# Patient Record
Sex: Male | Born: 1970 | Race: White | Hispanic: No | Marital: Single | Smoking: Never smoker
Health system: Southern US, Community
[De-identification: ages and names within clinical notes are randomized; demographics above are authoritative.]

## PROBLEM LIST (undated history)

## (undated) DIAGNOSIS — F320 Major depressive disorder, single episode, mild: Secondary | ICD-10-CM

## (undated) DIAGNOSIS — E785 Hyperlipidemia, unspecified: Secondary | ICD-10-CM

## (undated) DIAGNOSIS — Z87442 Personal history of urinary calculi: Secondary | ICD-10-CM

## (undated) DIAGNOSIS — G8929 Other chronic pain: Secondary | ICD-10-CM

## (undated) DIAGNOSIS — K219 Gastro-esophageal reflux disease without esophagitis: Secondary | ICD-10-CM

## (undated) DIAGNOSIS — K4090 Unilateral inguinal hernia, without obstruction or gangrene, not specified as recurrent: Secondary | ICD-10-CM

## (undated) DIAGNOSIS — N200 Calculus of kidney: Secondary | ICD-10-CM

## (undated) DIAGNOSIS — H6690 Otitis media, unspecified, unspecified ear: Secondary | ICD-10-CM

## (undated) DIAGNOSIS — J302 Other seasonal allergic rhinitis: Secondary | ICD-10-CM

## (undated) DIAGNOSIS — I10 Essential (primary) hypertension: Secondary | ICD-10-CM

## (undated) DIAGNOSIS — K648 Other hemorrhoids: Secondary | ICD-10-CM

## (undated) DIAGNOSIS — E786 Lipoprotein deficiency: Secondary | ICD-10-CM

## (undated) DIAGNOSIS — T7840XA Allergy, unspecified, initial encounter: Secondary | ICD-10-CM

## (undated) DIAGNOSIS — K2970 Gastritis, unspecified, without bleeding: Secondary | ICD-10-CM

## (undated) DIAGNOSIS — F32A Depression, unspecified: Secondary | ICD-10-CM

## (undated) HISTORY — DX: Unilateral inguinal hernia, without obstruction or gangrene, not specified as recurrent: K40.90

## (undated) HISTORY — DX: Hyperlipidemia, unspecified: E78.5

## (undated) HISTORY — DX: Lipoprotein deficiency: E78.6

## (undated) HISTORY — DX: Otitis media, unspecified, unspecified ear: H66.90

## (undated) HISTORY — DX: Depression, unspecified: F32.A

## (undated) HISTORY — PX: BACK SURGERY: SHX140

## (undated) HISTORY — DX: Other seasonal allergic rhinitis: J30.2

## (undated) HISTORY — DX: Calculus of kidney: N20.0

## (undated) HISTORY — DX: Allergy, unspecified, initial encounter: T78.40XA

## (undated) HISTORY — DX: Major depressive disorder, single episode, mild: F32.0

## (undated) HISTORY — DX: Other hemorrhoids: K64.8

## (undated) HISTORY — DX: Gastritis, unspecified, without bleeding: K29.70

## (undated) HISTORY — DX: Gastro-esophageal reflux disease without esophagitis: K21.9

## (undated) HISTORY — PX: COLONOSCOPY: SHX174

## (undated) HISTORY — DX: Essential (primary) hypertension: I10

---

## 1999-11-05 ENCOUNTER — Emergency Department (HOSPITAL_COMMUNITY): Admission: EM | Admit: 1999-11-05 | Discharge: 1999-11-05 | Payer: Self-pay | Admitting: Emergency Medicine

## 2001-12-23 ENCOUNTER — Observation Stay (HOSPITAL_COMMUNITY): Admission: RE | Admit: 2001-12-23 | Discharge: 2001-12-24 | Payer: Self-pay | Admitting: Neurosurgery

## 2004-05-26 ENCOUNTER — Emergency Department (HOSPITAL_COMMUNITY): Admission: EM | Admit: 2004-05-26 | Discharge: 2004-05-27 | Payer: Self-pay | Admitting: Emergency Medicine

## 2004-11-06 ENCOUNTER — Ambulatory Visit (HOSPITAL_COMMUNITY): Admission: RE | Admit: 2004-11-06 | Discharge: 2004-11-06 | Payer: Self-pay | Admitting: Neurosurgery

## 2004-11-21 ENCOUNTER — Ambulatory Visit (HOSPITAL_COMMUNITY): Admission: RE | Admit: 2004-11-21 | Discharge: 2004-11-22 | Payer: Self-pay | Admitting: Neurosurgery

## 2004-12-21 ENCOUNTER — Ambulatory Visit (HOSPITAL_COMMUNITY): Admission: RE | Admit: 2004-12-21 | Discharge: 2004-12-21 | Payer: Self-pay | Admitting: Neurosurgery

## 2006-11-13 ENCOUNTER — Ambulatory Visit: Payer: Self-pay | Admitting: General Practice

## 2006-12-31 ENCOUNTER — Inpatient Hospital Stay (HOSPITAL_COMMUNITY): Admission: RE | Admit: 2006-12-31 | Discharge: 2007-01-03 | Payer: Self-pay | Admitting: Neurosurgery

## 2007-11-06 ENCOUNTER — Encounter: Payer: Self-pay | Admitting: Family Medicine

## 2008-02-19 ENCOUNTER — Encounter: Payer: Self-pay | Admitting: Family Medicine

## 2010-05-29 ENCOUNTER — Encounter (INDEPENDENT_AMBULATORY_CARE_PROVIDER_SITE_OTHER): Payer: Self-pay | Admitting: *Deleted

## 2010-11-22 NOTE — Letter (Signed)
Summary: Nadara Eaton letter  [place] [facility]  [address] [place], [place] [address]   Phone: [phone]  Fax: [phone]       05/29/2010 MRN: [id number]  Mordechai Embry [address] [place], [place]  [address]  Dear Mr. Lyla [facility] [place], and [facility] announce the retirement of Arta Silence, M.D., from full-time practice at the [facility] office effective April 19, 2010 and his plans of returning part-time.  It is important to Dr. Hetty Ely and to our practice that you understand that [facility] has seven physicians in our office for your health care needs.  We will continue to offer the same exceptional care that you have today.    Dr. Hetty Ely has spoken to many of you about his plans for retirement and returning part-time in the fall.   We will continue to work with you through the transition to schedule appointments for you in the office and meet the high standards that [place] is committed to.   Again, it is with great pleasure that we share the news that Dr. Hetty Ely will return to [facility] at [facility] in October of 2011 with a reduced schedule.    If you have any questions, or would like to request an appointment with one of our physicians, please call us at [phone] and press the option for Scheduling an appointment.  We take pleasure in providing you with excellent patient care and look forward to seeing you at your next office visit.  Our [facility] Physicians are:  Tillman Abide, M.D. Laurita Quint, M.D. Roxy Manns, M.D. Kerby Nora, M.D. Hannah Beat, M.D. Ruthe Mannan, M.D. We proudly welcomed Raechel Ache, M.D. and Eustaquio Boyden, M.D. to the practice in July/August 2011.  Sincerely,  [facility]

## 2011-03-08 NOTE — Op Note (Signed)
Walter Edwards, Walter Edwards NO.:  [id number]   MEDICAL RECORD NO.:  [id number]          PATIENT TYPE:  OIB   LOCATION:  6741                         FACILITY:  [facility]   PHYSICIAN:  Cristi Loron, M.D.DATE OF BIRTH:  01-31-1971   DATE OF PROCEDURE:  11/21/2004  DATE OF DISCHARGE:                                 OPERATIVE REPORT   BRIEF HISTORY:  The patient is a 40 year old white male who I performed a  right L5-S1 microdiskectomy on about 3 years ago.  The patient did very well  until a couple of months ago, when he began having recurrent right buttocks  and leg pain.  I worked him up with a lumbar MRI after he failed medical  management; it demonstrated he had a large recurrent herniated disk at L5-S1  on the right.  I discussed the various treatment options with the patient  including surgery.  The patient has weighed the risks, benefits and  alternatives to surgery and decided to proceed with a redo right L5-S1  microdiskectomy.   PREOPERATIVE DIAGNOSES:  1.  Recurrent right L5-S1 herniated nucleus pulposus.  2.  Lumbar radiculopathy.  3.  Lumbago.  4.  Spinal stenosis.   POSTOPERATIVE DIAGNOSES:  1.  Recurrent right L5-S1 herniated nucleus pulposus.  2.  Lumbar radiculopathy.  3.  Lumbago.  4.  Spinal stenosis.   PROCEDURE:  Redo right L5-S1 microdiskectomy using microdissection.   SURGEON:  Cristi Loron, M.D.   ASSISTANT:  Hewitt Shorts, M.D.   ANESTHESIA:  General endotracheal.   ESTIMATED BLOOD LOSS:  50 mL.   SPECIMENS:  None.   DRAINS:  None.   COMPLICATIONS:  None.   DESCRIPTION OF PROCEDURE:  The patient was brought to the operating room by  the anesthesia team.  General endotracheal anesthesia was induced.  The  patient was then turned to the prone position on the Wilson frame.  His  lumbosacral region was then prepared with Betadine scrub and Betadine  solution, and sterile drapes were applied.  I then injected the area  to be  incised with Marcaine with epinephrine solution.  I used a scalpel to make a  linear midline incision through the patient's previous surgical scar over  the L5-S1 interspace.  I used electrocautery to perform a right-sided  subperiosteal dissection, exposing the right spinous processes of L5 and  sacrum.  I then obtained an intraoperative radiograph to confirm our  location.  We then inserted the [facility] retractor for exposure and then  we brought the operating microscope into the field and under its  magnification and illumination, completed the microdiskectomy/decompression.  We used a high-speed drill to extend the previously right L5 laminotomy in a  cephalad direction.  In fact, we performed a right L5 hemilaminectomy.  When  we encountered some relative non-scarred dura, we carefully used  microdiskectomy to dissect through the epidural tissue to expose the  underlying dura.  We completed the right L5 hemilaminectomy with the  Kerrison punch.  We then dissected the scar tissue away from the thecal sac  and exposed  the right S1 nerve root.  We then performed a foraminotomy about  the right S1 nerve root.  It was fairly well scarred in.  We then used  microdissection to clear up the nerve root from the underlying dural scarred  tissue and then Dr. Newell Coral gently retracted the right S1 nerve root and  the thecal sac medially with a Deaver retractor.  This exposed the  underlying herniated disk and I freed it up using a nerve hook and removed  it in 1 large fragment using the pituitary forceps.  After we did this, the  nerve root was quite well-decompressed.  I palpated along the ventral  surface of the thecal sac and along the exiting L5-S1 nerve root and noted  that the nerve root was well-decompressed.  We inspected the intervertebral  disk at L5-S1; there was no significant bulging disk or impending  herniations, we therefore did not violate the intervertebral disk   space/annulus fibrosis.  We then obtained stringent hemostasis with bipolar  cautery and then copiously irrigated the wound out with bacitracin solution.  We removed the solution and then removed the [facility] retractor, then  reapproximated the patient's back lumbar fascia with interrupted #1 Vicryl  suture, subcutaneous tissue with an interrupted 2-0 Vicryl suture and the  skin with Steri-Strips and Benzoin.  The wound was then coated with  Bacitracin ointment, sterile dressing were applied, the drapes were removed  and the patient was subsequently returned to the supine position, where he  was extubated by the anesthesia team and transported to the postanesthesia  care unit in stable condition.  All sponge, instrument and needle counts  were correct at the end of this case.      JDJ/MEDQ  D:  11/21/2004  T:  11/22/2004  Job:  [id number]

## 2011-03-08 NOTE — Op Note (Signed)
NAMEJAYIN, DEROUSSE NO.:  [id number]   MEDICAL RECORD NO.:  [id number]          PATIENT TYPE:  INP   LOCATION:  3004                         FACILITY:  [facility]   PHYSICIAN:  Cristi Loron, M.D.DATE OF BIRTH:  May 02, 1971   DATE OF PROCEDURE:  12/31/2006  DATE OF DISCHARGE:                               OPERATIVE REPORT   BRIEF HISTORY:  The patient is a 40 year old white male who has suffered  from back and right leg pain.  He failed medical management.  He has  previously had two previous ruptured disks at L5-S1. He was worked up  with a lumbar MRI which demonstrated herniated disk at L5-S1  on the  right. I discussed the various treatment options with him including  surgery.  The patient has weighed the risks, benefits and alternatives  of surgery and decided to proceed with a lumbar decompression and  fusion.   PREOPERATIVE DIAGNOSES:  Recurrent L5-S1 herniated nucleus pulposus,  spinal stenosis, degenerative disk disease, lumbago with lumbar  radiculopathy.   POSTOPERATIVE DIAGNOSES:  Recurrent L5-S1 herniated nucleus pulposus,  spinal stenosis, degenerative disk disease, lumbago with lumbar  radiculopathy.   PROCEDURE:  Redo L5-S1 microdiskectomy; L5-S1 transforaminal lumbar  interbody fusion; insertion of L5-S1 interbody prosthesis (Capstone PEEK  cage), posterior nonsegmental amputation L5-S1 with legacy titanium  pedicle screws and rods; L5-S1 posterolateral arthrodesis with localized  morselized autograft bone and VITOSS bone graft extender.   SURGEON:  Dr. Delma Officer.   ASSISTANT:  Dr. Maeola Harman.   ANESTHESIA:  General endotracheal.   ESTIMATED BLOOD LOSS:  150 mL.   SPECIMENS:  None.   DRAINS:  None.   COMPLICATIONS:  None.   PROCEDURE:  The patient was brought to the operating room by the  anesthesia team, general endotracheal anesthesia was induced.  The  patient was turned to the prone position on the Wilson frame.  His  lumbosacral region was then prepared with Betadine scrub with Betadine  solution, sterile drapes were applied. I then injected the area to be  incised with Marcaine with epinephrine solution, used a scalpel to make  a linear midline incision through his previous surgical scar over the L5-  S1 interspace. I used electrocautery to perform a bilateral  subperiosteal dissection exposing the spinous process of the lamina of  L5 and the upper sacrum.  We obtained an intraoperative radiograph to  confirm our location.   We began by using the high-speed drill to drill away some of the  epidural fibrosis.  We extended the patient's previous L5 laminotomy in  a cephalad direction until we encountered some relatively nonscarred  dura.  We then removed some of the epidural fibrosis with the Kerrison  punches.  We exposed the thecal sac and the right S1 as well as the L5  nerve root.  We then removed the inferior facet at L5 and this gave a  wide exposure to the intervertebral disk. There was a large recurrent  disk herniation which was some somewhat adherent to the ventral aspect  of the dura.  We carefully freed it  up using the [facility] #[facility] nerve roots  and then removed the disk herniation in multiple fragments with the  pituitary forceps.  We then incised into the L5-S1 intervertebral disk  and performed an aggressive diskectomy using pituitary forceps and  Epstein and Karlin curettes.  We prepared the vertebral endplates for  the fusion by removing all soft tissue.  We then used the trial spacers  and determined to use a 10 x 26 mm PEEK interbody spacer.  We prefilled  the spacer with local autograft bone and VITOSS bone graft extender.  We  then inserted it into the L5-S1 interspace of course after retracting  the neural structures out of harm's way.  We attempted to turn it  laterally but met with limited success, but there was a good snug fit in  the prosthesis in the interspace.  We filled  ventral dorsal to the  prosthesis with local autograft bone and VITOSS bone graft extender,  completing the transforaminal lumbar interbody fusion and insertion of  prosthesis.   We now turned our attention to the instrumentation. Under fluoroscopic  guidance, we cannulated the bilateral L5 and S1 pedicles with bone  probes.  We tapped the pedicles with a 5.5 mm tap at L5 and a 6.5 mm tap  at S1.  We then probed inside the tapped pedicles to rule out cortical  breeches.  We then inserted 6.5 x 45 mm pedicle screws bilaterally at L5  and 7.5 x 40 bilaterally at S1 under fluoroscopic guidance. We then  connected the unilateral pedicle screws with a lordotic rod which we  fastened in place using the caps completing the instrumentation.   We now turned our attention to the posterolateral arthrodesis.  We used  a high-speed drill to decorticate the remainder of the facette pars at  L5-S1 on the left and laid a combination of local autograft bone and  VITOSS over decorticated posterolateral structures completing the  posterolateral arthrodesis.  We inspected the right thecal sac and the  right L5 and S1 nerve roots and noted that they were well decompressed.  We then obtained hemostasis using bipolar electrocautery.  We irrigated  the wound out bacitracin solution, removed the retractor and then  reapproximated the patient's thoracolumbar fascia with interrupted #1  Vicryl suture, subcutaneous tissue with interrupted 2-0 Vicryl suture  and the skin with Steri-Strips and Benzoin.  The wound was then coated  with bacitracin ointment and sterile dry dressings applied.  The drapes  were removed and the patient was subsequently returned to the supine  position where he was extubated by the anesthesia team and transported  to the post anesthesia care unit in stable condition.  All sponge,  instrument and needle counts correct at the end of  this case.      Cristi Loron, M.D.   Electronically Signed     JDJ/MEDQ  D:  01/01/2007  T:  01/03/2007  Job:  [id number]

## 2011-03-08 NOTE — Discharge Summary (Signed)
Walter Edwards, Walter Edwards NO.:  [id number]   MEDICAL RECORD NO.:  [id number]          PATIENT TYPE:  INP   LOCATION:  3004                         FACILITY:  [facility]   PHYSICIAN:  Cristi Loron, M.D.DATE OF BIRTH:  [birth year]   DATE OF ADMISSION:  12/31/2006  DATE OF DISCHARGE:  01/03/2007                               DISCHARGE SUMMARY   BRIEF HISTORY:  The patient is a 40 year old white male who has suffered  from back and right leg pain.  He failed medical management.  He said he  has previously had 2 ruptured lumbar discs at L5-S1.  He was worked up  with a lumbar MRI, which demonstrated a herniated disc L5-S1 on the  right.  I discussed the various treatment options with the patient,  including surgery.  The patient has weighed the risks, benefits and  alternatives to surgery.  Decided to proceed with a lumbar decompression  and fusion.   For further details of this admission, please refer to typed history and  physical.   HOSPITAL COURSE:  I admitted the patient to [facility] on December 31, 2006.  On the day of admission, I performed a L5-S1 decompression  and fusion.  The surgery went well (for further details of this  operation, please refer to typed operative note).   POSTOPERATIVE COURSE:  The patient's postoperative course was  unremarkable and by postop day number 3 he was ready for discharge to  home.   DISCHARGE INSTRUCTIONS:  The patient was given written discharge  instructions.   DISCHARGE PRESCRIPTIONS:  1. Percocet 10/325, number 101, p.o. q.4 hours p.r.n. for pain.  2. Valium 4 mg, number 51, p.o. q.8 hours p.r.n. for muscle spasms.   FINAL DIAGNOSIS:  Recurrent L5-S1 herniated nucleus pulposus, spinal  stenosis, degenerative disk disease, lumbago, lumbar radiculopathy.   PROCEDURE PERFORMED:  Redo L5-S1 microdiskectomy; L5-S1 transforaminal  lumbar interbody fusion; insertion of L5-S1 interbody prosthesis  (Capstone B cage);  posterior nonsegmental instrumentation L5-S1 with  Legacy titanium peg screws and rod; L5-S1 posterolateral arthrodesis  with local morselized autograft bone and Vitoss bone graft extender.      Cristi Loron, M.D.  Electronically Signed     JDJ/MEDQ  D:  02/11/2007  T:  02/11/2007  Job:  [id number]

## 2011-03-08 NOTE — Op Note (Signed)
[place]  Patient:    Edwards, Walter Visit Number: [id number] MRN: [id number]          Service Type: SUR Location: 3000 [id number] Attending Physician:  Cristi Loron Dictated by:   Cristi Loron, M.D. Proc. Date: 12/23/01 Admit Date:  12/23/2001 Discharge Date: 12/24/2001                             Operative Report  PREOPERATIVE DIAGNOSES:  Right L5-S1 herniated nucleus pulposus, spinal stenosis, degenerative disk disease, lumbago, lumbar radiculopathy.  POSTOPERATIVE DIAGNOSES:  Right L5-S1 herniated nucleus pulposus, spinal stenosis, degenerative disk disease, lumbago, lumbar radiculopathy.  PROCEDURE:  Right L5-S1 microdiskectomy using microdissection.  SURGEON:  Cristi Loron, M.D.  ASSISTANT:  Hewitt Shorts, M.D.  ANESTHESIA:  General endotracheal.  ESTIMATED BLOOD LOSS:  Less than 100 cc.  SPECIMENS:  None.  DRAINS:  None.  COMPLICATIONS:  None.  BRIEF HISTORY:  The patient is a 40 year old white male who suffered from months of back and right leg pain.  He had failed medical management and was worked up with a lumbar MRI.  It demonstrated a large herniated disk at L5-S1 on the right.  The patients signs, symptoms, and physical exam were consistent with a right S1 radiculopathy.  He therefore weighed the risks, benefits, and alternatives of surgery and decided to proceed with a right L5-S1 microdiskectomy.  DESCRIPTION OF PROCEDURE:  The patient was brought to the operating room by the anesthesia team, and general endotracheal anesthesia was induced.  The patient was then turned to the prone position on the Wilson frame.  His lumbosacral region was then prepared with Betadine scrub and Betadine solution, and sterile drapes were applied.  I then injected the area to be incised with Marcaine with epinephrine solution and used a scalpel to make a linear midline incision at the L5-S1 interspace.  I used  electrocautery to dissect down to the thoracolumbar fascia and divide the fascia just to the right of midline, performing a right-sided subperiosteal dissection, stripping the paraspinous musculature from the right spinous processes and laminae of L5 down to the sacrum.  I inserted the McCullough retractor for exposure and then obtained the intraoperative radiograph.  I then brought the operative microscope into the field and under its magnification and illumination, I completed the microdissection/decompression, i.e., I used the high-speed drill to perform a right L5 laminotomy.  I widened the laminotomy with a Kerrison punch, removing the right L5-S1 ligamentum flavum.  I performed a foraminotomy about the right S1 nerve root.  I used microdissection to free up the right S1 nerve root and the thecal sac from the epidural tissue and then retracted it medially.  This exposed a large underlying herniated disk.  I incised the herniated disk with a 15 blade scalpel and performed a partial diskectomy using pituitary forceps and the Epstein curettes.  When I was satisfied with the diskectomy, I palpated along the ventral surface of the thecal sac and along the exit route of the right S1 nerve root and noted that the neural structures were well-decompressed.  I then achieved stringent hemostasis with bipolar electrocautery.  I copiously irrigated the wound with bacitracin solution, removed the solution, and then removed the McCullough retractor and then reapproximated the patients thoracolumbar fascia with interrupted #1 Vicryl suture, the subcutaneous tissues with interrupted 2-0 Vicryl suture, and the skin with Steri-Strips and benzoin.  The  wound was then coated with bacitracin ointment and a sterile dressing applied.  The drapes were removed. The patient was subsequently returned to a supine position, where he was extubated by the anesthesia team and transported to the postanesthesia  care unit in stable condition.  All sponge, instrument, and needle counts were correct at the end of this case. Dictated by:   Cristi Loron, M.D. Attending Physician:  Tressie Stalker D DD:  12/23/01 TD:  12/24/01 Job: [id number]

## 2011-12-11 ENCOUNTER — Ambulatory Visit: Payer: Self-pay | Admitting: General Practice

## 2012-04-16 ENCOUNTER — Emergency Department: Payer: Self-pay | Admitting: Emergency Medicine

## 2013-08-26 ENCOUNTER — Ambulatory Visit: Payer: Self-pay | Admitting: General Practice

## 2018-03-27 DIAGNOSIS — M5440 Lumbago with sciatica, unspecified side: Secondary | ICD-10-CM | POA: Insufficient documentation

## 2018-11-30 ENCOUNTER — Other Ambulatory Visit: Payer: Self-pay | Admitting: Internal Medicine

## 2018-11-30 ENCOUNTER — Telehealth: Payer: Self-pay | Admitting: Internal Medicine

## 2018-11-30 DIAGNOSIS — R109 Unspecified abdominal pain: Secondary | ICD-10-CM

## 2018-12-04 ENCOUNTER — Ambulatory Visit
Admission: RE | Admit: 2018-12-04 | Discharge: 2018-12-04 | Disposition: A | Discharge status: Home / Self Care | Payer: BLUE CROSS/BLUE SHIELD | Source: Ambulatory Visit | Attending: Internal Medicine | Admitting: Internal Medicine

## 2018-12-04 DIAGNOSIS — R109 Unspecified abdominal pain: Secondary | ICD-10-CM | POA: Insufficient documentation

## 2019-04-16 LAB — HM COLONOSCOPY

## 2019-04-28 ENCOUNTER — Other Ambulatory Visit: Payer: Self-pay

## 2019-04-28 ENCOUNTER — Telehealth: Payer: Self-pay

## 2019-04-28 MED ORDER — IPRATROPIUM BROMIDE 0.06 % NA SOLN: 2.0000 | Freq: Three times a day (TID) | NASAL | 3 refills | Status: DC

## 2019-04-29 NOTE — Telephone Encounter (Signed)
Canceled.

## 2019-06-02 ENCOUNTER — Other Ambulatory Visit: Payer: Self-pay | Admitting: Internal Medicine

## 2019-07-22 ENCOUNTER — Ambulatory Visit: Payer: Self-pay

## 2019-07-22 DIAGNOSIS — Z23 Encounter for immunization: Secondary | ICD-10-CM

## 2019-08-13 ENCOUNTER — Other Ambulatory Visit: Payer: Self-pay

## 2019-08-13 ENCOUNTER — Encounter (HOSPITAL_COMMUNITY): Payer: Self-pay

## 2019-08-13 ENCOUNTER — Emergency Department (HOSPITAL_COMMUNITY)
Admission: EM | Admit: 2019-08-13 | Discharge: 2019-08-14 | Disposition: A | Discharge status: Home / Self Care | Payer: 59 | Attending: Emergency Medicine | Admitting: Emergency Medicine

## 2019-08-13 ENCOUNTER — Emergency Department (HOSPITAL_COMMUNITY): Payer: 59

## 2019-08-13 DIAGNOSIS — R079 Chest pain, unspecified: Secondary | ICD-10-CM | POA: Diagnosis not present

## 2019-08-13 DIAGNOSIS — R0789 Other chest pain: Secondary | ICD-10-CM | POA: Diagnosis not present

## 2019-08-13 LAB — BASIC METABOLIC PANEL
Anion gap: 9 (ref 5–15)
BUN: 14 mg/dL (ref 6–20)
CO2: 26 mmol/L (ref 22–32)
Calcium: 8.9 mg/dL (ref 8.9–10.3)
Chloride: 106 mmol/L (ref 98–111)
Creatinine, Ser: 1.01 mg/dL (ref 0.61–1.24)
GFR calc Af Amer: >60 mL/min (ref >60)
GFR calc non Af Amer: >60 mL/min (ref >60)
Glucose, Bld: 110 mg/dL — ABNORMAL HIGH (ref 70–99)
Potassium: 3.7 mmol/L (ref 3.5–5.1)
Sodium: 141 mmol/L (ref 135–145)

## 2019-08-13 LAB — CBC
HCT: 45.3 % (ref 39.0–52.0)
Hemoglobin: 15.3 g/dL (ref 13.0–17.0)
MCH: 31 pg (ref 26.0–34.0)
MCHC: 33.8 g/dL (ref 30.0–36.0)
MCV: 91.9 fL (ref 80.0–100.0)
Platelets: 251 10*3/uL (ref 150–400)
RBC: 4.93 MIL/uL (ref 4.22–5.81)
RDW: 12.6 % (ref 11.5–15.5)
WBC: 9.7 10*3/uL (ref 4.0–10.5)
nRBC: 0 % (ref 0.0–0.2)

## 2019-08-13 LAB — TROPONIN I (HIGH SENSITIVITY)
Troponin I (High Sensitivity): 5 ng/L (ref <18)
Troponin I (High Sensitivity): 5 ng/L (ref <18)

## 2019-08-13 NOTE — ED Triage Notes (Signed)
Pt endorses left sided chest pain with radiation to the left arm x 4-5 days, worse with exertion. Hypertensive and no hx. Axox4.

## 2019-08-14 MED ORDER — PANTOPRAZOLE SODIUM 40 MG PO TBEC: 40.0000 mg | DELAYED_RELEASE_TABLET | Freq: Every day | ORAL | 3 refills | Status: DC

## 2019-08-14 NOTE — Discharge Instructions (Signed)
Please schedule follow-up with cardiology to recheck your blood pressure and determine if you need outpatient stress test.  If you develop any worsening symptoms, return to the ER for repeat evaluation.

## 2019-08-14 NOTE — ED Notes (Signed)
Pt verbalizes understanding of d/c instructions. Prescriptions reviewed with patient. Pt ambulatory at d/c with all belongings.  

## 2019-08-14 NOTE — ED Provider Notes (Addendum)
[facility] Provider Note   CSN: [id number] Arrival date & time: 08/13/19  1745     History   Chief Complaint Chief Complaint  Patient presents with  . Chest Pain    HPI Walter Edwards is a 48 y.o. male.  HPI: A 48 year old patient presents for evaluation of chest pain. Initial onset of pain was approximately 1-3 hours ago. The patient's chest pain is well-localized, is sharp and is not worse with exertion. The patient's chest pain is middle- or left-sided, is not described as heaviness/pressure/tightness and does not radiate to the arms/jaw/neck. The patient does not complain of nausea and denies diaphoresis. The patient has no history of stroke, has no history of peripheral artery disease, has not smoked in the past 90 days, denies any history of treated diabetes, has no relevant family history of coronary artery disease (first degree relative at less than age 48), is not hypertensive, has no history of hypercholesterolemia and does not have an elevated BMI (>=30).   Patient presents to the emergency department for evaluation of chest pain.  Patient reports that for the last couple of weeks he has just felt very fatigued and tired.  He thought that it was just because he works a lot.  Over the last 4 or 5 days, however, he started to have left-sided chest pain.  He reports that it feels like a "bubble" in his chest.  He thought it was indigestion, took antacids but that did not seem to help.  He has not identified any clear cause of the pain.  He reports that it comes and goes, sometimes when he is exerting himself but at other times at rest.  No associated shortness of breath, nausea, diaphoresis.  No abdominal pain.     Past Medical History:  Diagnosis Date  . Kidney stones   . Low HDL (under 40)   . Mild depression (HCC)   . Otitis media   . Seasonal rhinitis     Patient Active Problem List   Diagnosis Date Noted  . Elevated blood  pressure reading without diagnosis of hypertension 08/16/2019    Past Surgical History:  Procedure Laterality Date  . BACK SURGERY     x3        Home Medications    Prior to Admission medications   Medication Sig Start Date End Date Taking? Authorizing Provider  amLODipine (NORVASC) 5 MG tablet Take 1 tablet (5 mg total) by mouth daily. 08/18/19 11/16/19  O'NealRonnald Ramp, [place], MD  docusate sodium (COLACE) 100 MG capsule Take 200 mg by mouth 2 (two) times daily.    [provider]  ipratropium (ATROVENT) 0.06 % nasal spray Place 2 sprays into both nostrils 3 (three) times daily. 04/28/19   Jamelle HaringHendrickson, Clifford D, MD  loratadine (CLARITIN) 10 MG tablet Take 10 mg by mouth daily.    [provider]  montelukast (SINGULAIR) 10 MG tablet TAKE 1 TABLET BY MOUTH EVERY MORNING FORALLERGIES 06/02/19   Jamelle HaringHendrickson, Clifford D, MD  Multiple Vitamin (MULTIVITAMIN) tablet Take 1 tablet by mouth daily.    [provider]  pantoprazole (PROTONIX) 40 MG tablet Take 1 tablet (40 mg total) by mouth daily. 08/14/19   Gilda CreasePollina,  J, MD  polycarbophil (FIBERCON) 625 MG tablet Take 1,250 mg by mouth daily.    [provider]    Family History Family History  Problem Relation Age of Onset  . Diabetes Mother   . Anxiety disorder Mother   .  Hypertension Mother   . Diabetes Father   . Hypertension Father   . Obesity Sister   . Hypertension Sister     Social History Social History   Tobacco Use  . Smoking status: Never Smoker  . Smokeless tobacco: Never Used  Substance Use Topics  . Alcohol use: Yes    Comment: occ  . Drug use: Never     Allergies   Hydrocodone   Review of Systems Review of Systems  Cardiovascular: Positive for chest pain.  All other systems reviewed and are negative.    Physical Exam Updated Vital Signs BP (!) 146/96   Pulse 74   Temp 97.9 F (36.6 C) (Oral)   Resp 17   SpO2 97%   Physical Exam Vitals signs and  nursing note reviewed.  Constitutional:      General: He is not in acute distress.    Appearance: Normal appearance. He is well-developed.  HENT:     Head: Normocephalic and atraumatic.     Right Ear: Hearing normal.     Left Ear: Hearing normal.     Nose: Nose normal.  Eyes:     Conjunctiva/sclera: Conjunctivae normal.     Pupils: Pupils are equal, round, and reactive to light.  Neck:     Musculoskeletal: Normal range of motion and neck supple.  Cardiovascular:     Rate and Rhythm: Regular rhythm.     Heart sounds: S1 normal and S2 normal. No murmur. No friction rub. No gallop.   Pulmonary:     Effort: Pulmonary effort is normal. No respiratory distress.     Breath sounds: Normal breath sounds.  Chest:     Chest wall: No tenderness.  Abdominal:     General: Bowel sounds are normal.     Palpations: Abdomen is soft.     Tenderness: There is no abdominal tenderness. There is no guarding or rebound. Negative signs include Murphy's sign and McBurney's sign.     Hernia: No hernia is present.  Musculoskeletal: Normal range of motion.  Skin:    General: Skin is warm and dry.     Findings: No rash.  Neurological:     Mental Status: He is alert and oriented to person, place, and time.     GCS: GCS eye subscore is 4. GCS verbal subscore is 5. GCS motor subscore is 6.     Cranial Nerves: No cranial nerve deficit.     Sensory: No sensory deficit.     Coordination: Coordination normal.  Psychiatric:        Speech: Speech normal.        Behavior: Behavior normal.        Thought Content: Thought content normal.      ED Treatments / Results  Labs (all labs ordered are listed, but only abnormal results are displayed) Labs Reviewed  BASIC METABOLIC PANEL - Abnormal; Notable for the following components:      Result Value   Glucose, Bld 110 (*)    All other components within normal limits  CBC  TROPONIN I (HIGH SENSITIVITY)  TROPONIN I (HIGH SENSITIVITY)    EKG EKG  Interpretation  Date/Time:  Friday August 13 2019 17:55:30 EDT Ventricular Rate:  90 PR Interval:  148 QRS Duration: 68 QT Interval:  336 QTC Calculation: 411 R Axis:   70 Text Interpretation:  Normal sinus rhythm Normal ECG Confirmed by Orpah Greek [phone]) on 08/14/2019 1:00:02 AM   Radiology No results found.  Procedures Procedures (  including critical care time)  Medications Ordered in ED Medications - No data to display   Initial Impression / Assessment and Plan / ED Course  I have reviewed the triage vital signs and the nursing notes.  Pertinent labs & imaging results that were available during my care of the patient were reviewed by me and considered in my medical decision making (see chart for details).     HEAR Score: 1  Patient presents with atypical chest pain.  He does not have any cardiac risk factors.  He is mildly hypertensive here in the ER and reports that he has checked his blood pressure over the last few days and has been staying around 140/90.  This is a borderline elevation, possibly 1 cardiac risk factor but he does not have any others.  This includes no family history of cardiac disease.  EKG is normal.  Symptoms are atypical for cardiac etiology.  Patient reports that he has been exercising fairly and tensely for the last 6 months.  He has a history of back problems so he cannot run but he has been walking and hiking between 4 and 10 miles a day.  He never gets exertional pain with his walks or hikes.  Work-up here has been unremarkable.  Will initiate PPI and have patient follow-up with cardiology as an outpatient.  Final Clinical Impressions(s) / ED Diagnoses   Final diagnoses:  Chest pain, unspecified type    ED Discharge Orders         Ordered    pantoprazole (PROTONIX) 40 MG tablet  Daily     08/14/19 0150           Gilda Crease, MD 08/14/19 0150    Gilda Crease, MD 08/21/19 [phone]

## 2019-08-16 ENCOUNTER — Encounter: Payer: Self-pay | Admitting: Occupational Medicine

## 2019-08-16 ENCOUNTER — Ambulatory Visit: Payer: 59 | Admitting: Occupational Medicine

## 2019-08-16 ENCOUNTER — Other Ambulatory Visit: Payer: Self-pay

## 2019-08-16 DIAGNOSIS — R030 Elevated blood-pressure reading, without diagnosis of hypertension: Secondary | ICD-10-CM | POA: Insufficient documentation

## 2019-08-16 NOTE — Progress Notes (Signed)
Cardiology Office Note:   Date:  08/18/2019  NAME:  Walter Edwards    MRN: [id number] DOB:  1971-01-26   PCP:  System, Pcp Not In  Cardiologist:  No primary care provider on file.  Electrophysiologist:  None   Referring MD: No ref. provider found   Chief Complaint  Patient presents with   Chest Pain    History of Present Illness:   Walter Edwards is a 48 y.o. male without significant medical history who is being seen today for the evaluation of chest pain.  He was recently seen in emergency room on 10/23 with complaints of chest pain.  EKG was extremely normal, and high-sensitivity troponins were negative x2.  He reports 1 week ago he began to have what he describes as left-sided dull chest pain that lasted 4 to 5 days.  He reports it felt like indigestion.  He reports the pain would come and go with no identifiable trigger.  He reports no alleviating factors.  He reports the pain felt like it was cramping in his arm.  He states the pain got better when he would get his mind off of it.  He reports that he is intermittently checked his blood pressure when having this pain and his systolic pressures have ranged in the 1 40-1 50 range with diastolic in the 80-90 range.  He reports that he was seen in the emergency room and his work-up was negative for an acute coronary syndrome.  His blood pressure was elevated, however he was not started on any medications by his emergency room physician and and subsequent hospital follow-up visit with his primary care physician he did not get started on medication as he was seeing a cardiologist.  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  He has been inactive for a bit but is getting back to exercising daily.  He is walking 10,000 steps per day and walks 4 miles on a treadmill that he limitations.  He reports that he also walks 4 to 6 miles on his days off and this includes hiking and he has no worsening chest pain or chest  pressure.  He has no history of myocardial infarction, diabetes.  No strong family history of CAD that I can tell.  He is also been prescribed Protonix and he has started taking that as well.  Blood pressure today is slightly elevated on the diastolic range.  Past Medical History: Past Medical History:  Diagnosis Date   Kidney stones    Low HDL (under 40)    Mild depression (HCC)    Otitis media    Seasonal rhinitis     Past Surgical History: Past Surgical History:  Procedure Laterality Date   BACK SURGERY     x3    Current Medications: Current Meds  Medication Sig   docusate sodium (COLACE) 100 MG capsule Take 200 mg by mouth 2 (two) times daily.   ipratropium (ATROVENT) 0.06 % nasal spray Place 2 sprays into both nostrils 3 (three) times daily.   loratadine (CLARITIN) 10 MG tablet Take 10 mg by mouth daily.   montelukast (SINGULAIR) 10 MG tablet TAKE 1 TABLET BY MOUTH EVERY MORNING FORALLERGIES   Multiple Vitamin (MULTIVITAMIN) tablet Take 1 tablet by mouth daily.   pantoprazole (PROTONIX) 40 MG tablet Take 1 tablet (40 mg total) by mouth daily.   polycarbophil (FIBERCON) 625 MG tablet Take 1,250 mg by mouth daily.     Allergies:  Hydrocodone   Social History: Social History   Socioeconomic History   Marital status: Divorced    Spouse name: Not on file   Number of children: 1   Years of education: Not on file   Highest education level: Not on file  Occupational History   Not on file  Social Needs   Financial resource strain: Not on file   Food insecurity    Worry: Not on file    Inability: Not on file   Transportation needs    Medical: Not on file    Non-medical: Not on file  Tobacco Use   Smoking status: Never Smoker   Smokeless tobacco: Never Used  Substance and Sexual Activity   Alcohol use: Yes    Comment: occ   Drug use: Never   Sexual activity: Not on file  Lifestyle   Physical activity    Days per week: Not on file      Minutes per session: Not on file   Stress: Not on file  Relationships   Social connections    Talks on phone: Not on file    Gets together: Not on file    Attends religious service: Not on file    Active member of club or organization: Not on file    Attends meetings of clubs or organizations: Not on file    Relationship status: Not on file  Other Topics Concern   Not on file  Social History Narrative   Works as Airline pilot in [employer]     Family History: The patient's family history includes Anxiety disorder in his mother; Diabetes in his father and mother; Hypertension in his father, mother, and sister; Obesity in his sister.  ROS:   All other ROS reviewed and negative. Pertinent positives noted in the HPI.     EKGs/Labs/Other Studies Reviewed:   The following studies were personally reviewed by me today:  EKG:  EKG is not ordered today.  The ekg EKG obtained in the emergency room on 10/23 shows normal sinus rhythm, heart rate 90, no acute ST-T changes, no evidence of prior infarction, and was personally reviewed by me.   Recent Labs: 08/13/2019: BUN 14; Creatinine, Ser 1.01; Hemoglobin 15.3; Platelets 251; Potassium 3.7; Sodium 141   Recent Lipid Panel No results found for: CHOL, TRIG, HDL, CHOLHDL, VLDL, LDLCALC, LDLDIRECT  Physical Exam:   VS:  BP (!) 135/92    Pulse 84    Ht 5\' 10"  (1.778 m)    Wt 207 lb (93.9 kg)    BMI 29.70 kg/m    Wt Readings from Last 3 Encounters:  08/18/19 207 lb (93.9 kg)  08/16/19 203 lb (92.1 kg)    General: Well nourished, well developed, in no acute distress Heart: Atraumatic, normal size  Eyes: PEERLA, EOMI  Neck: Supple, no JVD Endocrine: No thryomegaly Cardiac: Normal S1, S2; RRR; no murmurs, rubs, or gallops Lungs: Clear to auscultation bilaterally, no wheezing, rhonchi or rales  Abd: Soft, nontender, no hepatomegaly  Ext: No edema, pulses 2+ Musculoskeletal: No deformities, BUE and BLE strength normal and  equal Skin: Warm and dry, no rashes   Neuro: Alert and oriented to person, place, time, and situation, CNII-XII grossly intact, no focal deficits  Psych: Normal mood and affect   ASSESSMENT:   Walter Edwards is a 48 y.o. male who presents for the following: 1. Chest pain of uncertain etiology   2. Essential hypertension     PLAN:   1. Chest pain  of uncertain etiology 2. Essential hypertension -He presents with rather atypical chest pain.  His symptoms are classic for angina.  He is able to complete her rather rigorous level of physical activity without any limitations.  His blood pressure was elevated in the emergency room and on my review was in the 150/100 range.  He also reports significant stress at home given that his daughter is not going to go to college.  I discussed with him the possibility of pursuing an exercise treadmill stress test, but this would take a coronavirus test in 2 days out of work prior to testing for quarantine.  He reports he would like to try treatment of his blood pressure to see if his symptoms get better.  He also will continue to take his Protonix.  I think this is reasonable as his EKG from the emergency room showed normal sinus rhythm without any acute ST-T changes or evidence of prior infarction.  He also had negative high-sensitivity troponins x2.  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  Overall, I have a very low suspicion that he has any obstructive CAD given his limited risk factors and very exceptional functional capacity without chest pain.  Disposition: Return in about 3 months (around 11/18/2019).  Medication Adjustments/Labs and Tests Ordered: Current medicines are reviewed at length with the patient today.  Concerns regarding medicines are outlined above.  No orders of the defined types were placed in this encounter.  Meds ordered  this encounter  Medications   amLODipine (NORVASC) 5 MG tablet    Sig: Take 1 tablet (5 mg total) by mouth daily.    Dispense:  30 tablet    Refill:  3    Patient Instructions  Medication Instructions:   START Amlodipine 5 mg daily.   *If you need a refill on your cardiac medications before your next appointment, please call your pharmacy*   Follow-Up: At [facility], you and your health needs are our priority.  As part of our continuing mission to provide you with exceptional heart care, we have created designated Provider Care Teams.  These Care Teams include your primary Cardiologist (physician) and Advanced Practice Providers (APPs -  Physician Assistants and Nurse Practitioners) who all work together to provide you with the care you need, when you need it.  Your next appointment:   3 months  The format for your next appointment:   In Person  Provider:   You may see No primary care provider on file. or one of the following Advanced Practice Providers on your designated Care Team:    Azalee CourseHao Meng, PA-C  Micah FlesherAngela Duke, PA-C or   Judy PimpleKrista Kroeger, PA-C     Signed, Lenna GilfordWesley T. Flora Lipps'Neal, MD [facility]   [facility]  [address], [address] [place], [place] [address] [phone]  08/18/2019 10:18 AM

## 2019-08-16 NOTE — Progress Notes (Signed)
  Subjective:     Patient ID: Walter Edwards, male   DOB: 1971/09/26, 48 y.o.   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   Had an episode of atypical chest pain and went to the ED on 10/23.  Negative Cardiac workup. Told he has HTN and chest discomfort is GI relateed.  Given script for PPI and insturcted to follow up with Cardiology.  He has appt with cardiology on Weds.     Employee very anxious about BP and has several BP measurement on his phone 130-160/90/110.     Review of Systems     Objective:   Physical Exam Today's Vitals   08/16/19 1123  BP: 140/90  Pulse: 72  Resp: 12  Temp: (!) 97.4 F (36.3 C)  TempSrc: Oral  SpO2: 98%  Weight: 203 lb (92.1 kg)  Height: 5\' 10"  (1.778 m)   Body mass index is 29.13 kg/m. Normal cardiac exam    Assessment:     Atypical Chest pain Anxiety    Plan:     Agree with plan for Cardiology eval on Weds.   No urgent medical needs today.  Encouraged PT to fill and take prescritpion for PPI.   Anxiety needs to be addressed at a later date if still present.

## 2019-08-16 NOTE — Progress Notes (Signed)
Chest Pain - still has a little, but hasn't started taking the Protonix.  States has been on bedrest since Friday.  BP at Fire [employer] yesterday 30 minutes prior was 136/112 (manually). BP reading at home - 164/95 (Father's automatic) - Yesterday.  [facility] ED physician told him to follow-up with primary care on Monday.  Cardiology Appt scheduled for Wednesday morning with Dr. Eleonore Chiquito in [place].  Rec'd Rx for Protonix at ED but hasn't picked it up

## 2019-08-18 ENCOUNTER — Other Ambulatory Visit: Payer: Self-pay

## 2019-08-18 ENCOUNTER — Ambulatory Visit (INDEPENDENT_AMBULATORY_CARE_PROVIDER_SITE_OTHER): Payer: 59 | Admitting: Cardiovascular Disease

## 2019-08-18 ENCOUNTER — Encounter: Payer: Self-pay | Admitting: Cardiovascular Disease

## 2019-08-18 VITALS — BP 135/92 | HR 84 | Ht 70.0 in | Wt 207.0 lb

## 2019-08-18 DIAGNOSIS — I10 Essential (primary) hypertension: Secondary | ICD-10-CM

## 2019-08-18 DIAGNOSIS — R079 Chest pain, unspecified: Secondary | ICD-10-CM | POA: Diagnosis not present

## 2019-08-18 MED ORDER — AMLODIPINE BESYLATE 5 MG PO TABS: 5.0000 mg | ORAL_TABLET | Freq: Every day | ORAL | 3 refills | Status: DC

## 2019-08-18 NOTE — Patient Instructions (Signed)
Medication Instructions:   START Amlodipine 5 mg daily.   *If you need a refill on your cardiac medications before your next appointment, please call your pharmacy*   Follow-Up: At [facility], you and your health needs are our priority.  As part of our continuing mission to provide you with exceptional heart care, we have created designated Provider Care Teams.  These Care Teams include your primary Cardiologist (physician) and Advanced Practice Providers (APPs -  Physician Assistants and Nurse Practitioners) who all work together to provide you with the care you need, when you need it.  Your next appointment:   3 months  The format for your next appointment:   In Person  Provider:   You may see No primary care provider on file. or one of the following Advanced Practice Providers on your designated Care Team:    Almyra Deforest, PA-C  Fabian Sharp, PA-C or   Roby Lofts, [place]

## 2019-08-31 ENCOUNTER — Other Ambulatory Visit: Payer: Self-pay

## 2019-08-31 DIAGNOSIS — J302 Other seasonal allergic rhinitis: Secondary | ICD-10-CM

## 2019-08-31 MED ORDER — LORATADINE 10 MG PO TABS: 10.0000 mg | ORAL_TABLET | Freq: Every day | ORAL | 5 refills | Status: DC

## 2019-09-21 ENCOUNTER — Ambulatory Visit: Payer: Self-pay

## 2019-09-28 DIAGNOSIS — Z6830 Body mass index (BMI) 30.0-30.9, adult: Secondary | ICD-10-CM | POA: Insufficient documentation

## 2019-10-11 ENCOUNTER — Encounter: Payer: Self-pay | Admitting: Occupational Medicine

## 2019-10-11 ENCOUNTER — Ambulatory Visit: Payer: 59 | Admitting: Occupational Medicine

## 2019-10-11 ENCOUNTER — Other Ambulatory Visit: Payer: Self-pay

## 2019-10-11 VITALS — BP 122/82 | HR 71 | Temp 98.5°F | Resp 12 | Ht 70.0 in | Wt 216.0 lb

## 2019-10-11 DIAGNOSIS — I10 Essential (primary) hypertension: Secondary | ICD-10-CM

## 2019-10-11 DIAGNOSIS — B353 Tinea pedis: Secondary | ICD-10-CM

## 2019-10-11 DIAGNOSIS — K219 Gastro-esophageal reflux disease without esophagitis: Secondary | ICD-10-CM

## 2019-10-11 DIAGNOSIS — Z021 Encounter for pre-employment examination: Secondary | ICD-10-CM

## 2019-10-11 DIAGNOSIS — K581 Irritable bowel syndrome with constipation: Secondary | ICD-10-CM

## 2019-10-11 LAB — POCT URINALYSIS DIPSTICK
Bilirubin, UA: NEGATIVE
Blood, UA: NEGATIVE
Glucose, UA: NEGATIVE
Ketones, UA: NEGATIVE
Leukocytes, UA: NEGATIVE
Nitrite, UA: NEGATIVE
Protein, UA: NEGATIVE
Spec Grav, UA: 1.025 (ref 1.010–1.025)
Urobilinogen, UA: 0.2 E.U./dL
pH, UA: 6 (ref 5.0–8.0)

## 2019-10-11 MED ORDER — AMLODIPINE BESYLATE 5 MG PO TABS: 5.0000 mg | ORAL_TABLET | Freq: Every day | ORAL | 0 refills | Status: DC

## 2019-10-11 MED ORDER — PANTOPRAZOLE SODIUM 40 MG PO TBEC: 40.0000 mg | DELAYED_RELEASE_TABLET | Freq: Every day | ORAL | 0 refills | Status: DC

## 2019-10-11 MED ORDER — CLOTRIMAZOLE 1 % EX CREA: 1.0000 "application " | TOPICAL_CREAM | Freq: Two times a day (BID) | CUTANEOUS | 0 refills | Status: DC

## 2019-10-11 NOTE — Progress Notes (Signed)
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  Labs drawn today, results pending.  Patient interested in establishing care with new PCP. Discussed sleep study for possible OSA, EGD for GERD, change in blood pressure medication, discussion of IBD-C treatment, and evaluation for anxiety. Goal to discontinue use of hydrocodone and Flexeril.  Darlin Priestly, MHS, PA-C 10/11/2019

## 2019-10-12 LAB — CMP12+LP+TP+TSH+6AC+PSA+CBC?
Alkaline Phosphatase: 96 IU/L (ref 39–117)
BUN/Creatinine Ratio: 11 (ref 9–20)
BUN: 11 mg/dL (ref 6–24)
Chloride: 101 mmol/L (ref 96–106)
Chol/HDL Ratio: 4.3 ratio (ref 0.0–5.0)
GFR calc Af Amer: 104 mL/min/{1.73_m2} (ref >59)
Globulin, Total: 2.6 g/dL (ref 1.5–4.5)
HDL: 47 mg/dL (ref >39)
Hematocrit: 47.2 % (ref 37.5–51.0)
Immature Granulocytes: 0 %
LDH: 210 IU/L (ref 121–224)
Lymphocytes Absolute: 1.3 10*3/uL (ref 0.7–3.1)
MCHC: 34.7 g/dL (ref 31.5–35.7)
MCV: 90 fL (ref 79–97)
Neutrophils Absolute: 4.8 10*3/uL (ref 1.4–7.0)
Triglycerides: 68 mg/dL (ref 0–149)
VLDL Cholesterol Cal: 12 mg/dL (ref 5–40)
WBC: 6.9 10*3/uL (ref 3.4–10.8)

## 2019-10-12 LAB — CMP12+LP+TP+TSH+6AC+PSA+CBC…
ALT: 41 IU/L (ref 0–44)
AST: 22 IU/L (ref 0–40)
Albumin/Globulin Ratio: 1.7 (ref 1.2–2.2)
Albumin: 4.3 g/dL (ref 4.0–5.0)
Basophils Absolute: 0 10*3/uL (ref 0.0–0.2)
Basos: 1 %
Bilirubin Total: 0.3 mg/dL (ref 0.0–1.2)
Calcium: 9.3 mg/dL (ref 8.7–10.2)
Cholesterol, Total: 204 mg/dL — ABNORMAL HIGH (ref 100–199)
Creatinine, Ser: 0.99 mg/dL (ref 0.76–1.27)
EOS (ABSOLUTE): 0.2 10*3/uL (ref 0.0–0.4)
Eos: 2 %
Estimated CHD Risk: 0.8 times avg. (ref 0.0–1.0)
Free Thyroxine Index: 1.7 (ref 1.2–4.9)
GFR calc non Af Amer: 90 mL/min/{1.73_m2} (ref >59)
GGT: 20 IU/L (ref 0–65)
Glucose: 109 mg/dL — ABNORMAL HIGH (ref 65–99)
Hemoglobin: 16.4 g/dL (ref 13.0–17.7)
Immature Grans (Abs): 0 10*3/uL (ref 0.0–0.1)
Iron: 71 ug/dL (ref 38–169)
LDL Chol Calc (NIH): 145 mg/dL — ABNORMAL HIGH (ref 0–99)
Lymphs: 18 %
MCH: 31.4 pg (ref 26.6–33.0)
Monocytes Absolute: 0.6 10*3/uL (ref 0.1–0.9)
Monocytes: 8 %
Neutrophils: 71 %
Phosphorus: 3.8 mg/dL (ref 2.8–4.1)
Platelets: 277 10*3/uL (ref 150–450)
Potassium: 5 mmol/L (ref 3.5–5.2)
Prostate Specific Ag, Serum: 4.6 ng/mL — ABNORMAL HIGH (ref 0.0–4.0)
RBC: 5.22 x10E6/uL (ref 4.14–5.80)
RDW: 12.8 % (ref 11.6–15.4)
Sodium: 141 mmol/L (ref 134–144)
T3 Uptake Ratio: 25 % (ref 24–39)
T4, Total: 6.7 ug/dL (ref 4.5–12.0)
TSH: 2.61 u[IU]/mL (ref 0.450–4.500)
Total Protein: 6.9 g/dL (ref 6.0–8.5)
Uric Acid: 5 mg/dL (ref 3.8–8.4)

## 2019-11-09 ENCOUNTER — Telehealth: Payer: Self-pay

## 2019-11-09 NOTE — Telephone Encounter (Signed)
Walter Harder, PA-C (Interim Provider) reviewed labs done at Fire Physical.  States PSA elevated & recommended a Urology Consult.  Office notes, labs & referral requested faxed 10/29/19 to [facility].  Received notification from [facility] that Walter Edwards scheduled to see Dr. Richardo Hanks 12/02/2019 at 9:00 am.  Walter Edwards.  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  Said no one from our office contacted him to review results & he had 3 appt at our office that were canceled for various reasons.  Said he canceled the appt with [facility].  Said he's scheduled to go to [facility] for labs Thursday (11/11/2019) & seen one of their doctors the following week.  Depending on the results of the 2nd labs, he may call [facility] back to reschedule an appt.  AMD

## 2019-11-11 ENCOUNTER — Other Ambulatory Visit: Payer: Self-pay

## 2019-11-11 ENCOUNTER — Encounter: Payer: Self-pay | Admitting: Family Medicine

## 2019-11-11 ENCOUNTER — Ambulatory Visit (INDEPENDENT_AMBULATORY_CARE_PROVIDER_SITE_OTHER): Payer: Managed Care, Other (non HMO) | Admitting: Family Medicine

## 2019-11-11 VITALS — BP 132/94 | HR 89 | Temp 98.1°F | Ht 69.75 in | Wt 216.5 lb

## 2019-11-11 DIAGNOSIS — R972 Elevated prostate specific antigen [PSA]: Secondary | ICD-10-CM | POA: Insufficient documentation

## 2019-11-11 DIAGNOSIS — Z981 Arthrodesis status: Secondary | ICD-10-CM | POA: Diagnosis not present

## 2019-11-11 DIAGNOSIS — M722 Plantar fascial fibromatosis: Secondary | ICD-10-CM

## 2019-11-11 DIAGNOSIS — I10 Essential (primary) hypertension: Secondary | ICD-10-CM | POA: Diagnosis not present

## 2019-11-11 DIAGNOSIS — R1011 Right upper quadrant pain: Secondary | ICD-10-CM | POA: Diagnosis not present

## 2019-11-11 MED ORDER — AMLODIPINE BESYLATE 10 MG PO TABS: 10.0000 mg | ORAL_TABLET | Freq: Every day | ORAL | 3 refills | Status: DC

## 2019-11-11 NOTE — Assessment & Plan Note (Signed)
BP still elevated. Increase amlodipine. Return 1 month for recheck. Cont diet/exercise

## 2019-11-11 NOTE — Assessment & Plan Note (Signed)
No findings on exam but on history this seems most consistent. Exercises provided. Return if not improved

## 2019-11-11 NOTE — Assessment & Plan Note (Signed)
Symptoms x 1 year. Normal [place]. Normal LFTs and recent blood work. Previously saw GI and has been doing constipation treatment with only intermittent success. Recommended follow-up with GI to discuss alternative options.

## 2019-11-11 NOTE — Progress Notes (Signed)
Subjective:     Walter Edwards is a 49 y.o. male presenting for Establish Care (has seen [facility] doctors.), Hypertension (seen at ER for this in October 2020-had chest pain then), and Foot Pain (left foot worse than right, pain present especially barefoot)     HPI   #HTN - taking amlodipine x 3 months - went to the ER in October for chest pain > started reflux medication - new diagnosis - started by cardiology - no further chest pain - return to cards if cp continues - BP at home - 136-148/80-90  #Foot pain - left foot - both feet hurt with working out - bottom of the foot and the heel - pain improved with walking with shoes - worse with putting weight - hurts more whe  #Side pain - felt like a hot needle in the RUQ - happened a few times when he started a weight loss plan - will feel like a sudden pressure develops there - improved with BM - got a colonoscopy which was normal - no polyps - told maybe IBS-C  - has had locums providers with the city and feels - Lost from 225 > 205 lbs - pressure comes and goes - not constant will last - no associated with eating - sometimes if hot/sweaty -   Requests STI testing  Review of Systems   Social History   Tobacco Use  Smoking Status Never Smoker  Smokeless Tobacco Former User        Objective:    BP Readings from Last 3 Encounters:  11/11/19 (!) 132/94  10/11/19 122/82  08/18/19 (!) 135/92   Wt Readings from Last 3 Encounters:  11/11/19 216 lb 8 oz (98.2 kg)  10/11/19 216 lb (98 kg)  08/18/19 207 lb (93.9 kg)    BP (!) 132/94   Pulse 89   Temp 98.1 F (36.7 C)   Ht 5' 9.75" (1.772 m)   Wt 216 lb 8 oz (98.2 kg)   SpO2 96%   BMI 31.29 kg/m    Physical Exam Constitutional:      Appearance: Normal appearance. He is not ill-appearing or diaphoretic.  HENT:     Right Ear: External ear normal.     Left Ear: External ear normal.     Nose: Nose normal.  Eyes:     General: No scleral icterus.  Extraocular Movements: Extraocular movements intact.     Conjunctiva/sclera: Conjunctivae normal.  Cardiovascular:     Rate and Rhythm: Normal rate and regular rhythm.     Heart sounds: No murmur.  Pulmonary:     Effort: Pulmonary effort is normal. No respiratory distress.     Breath sounds: Normal breath sounds. No wheezing.  Musculoskeletal:     Cervical back: Neck supple.     Comments: Left foot Inspection: no abnormalities Palpation: no ttp  ROM: normal Strength: normal Ligaments: normal  Skin:    General: Skin is warm and dry.  Neurological:     Mental Status: He is alert. Mental status is at baseline.  Psychiatric:        Mood and Affect: Mood normal.     The 10-year ASCVD risk score Mikey Bussing DC Jr., et al., 2013) is: 3.8%   Values used to calculate the score:     Age: 66 years     Sex: Male     Is Non-Hispanic African American: No     Diabetic: No     Tobacco smoker: No  Systolic Blood Pressure: 132 mmHg     Is BP treated: Yes     HDL Cholesterol: 47 mg/dL     Total Cholesterol: 204 mg/dL       Assessment & Plan:   Problem List Items Addressed This Visit      Cardiovascular and Mediastinum   Essential hypertension - Primary    BP still elevated. Increase amlodipine. Return 1 month for recheck. Cont diet/exercise      Relevant Medications   amLODipine (NORVASC) 10 MG tablet     Musculoskeletal and Integument   Plantar fasciitis    No findings on exam but on history this seems most consistent. Exercises provided. Return if not improved        Other   History of fusion of lumbar spine   Elevated PSA, less than 10 ng/ml    Referral to urology to discuss options and further work-up      Relevant Orders   Ambulatory referral to Urology   RUQ abdominal pain    Symptoms x 1 year. Normal [place]. Normal LFTs and recent blood work. Previously saw GI and has been doing constipation treatment with only intermittent success. Recommended follow-up with GI to  discuss alternative options.           Return in about 4 weeks (around 12/09/2019).  Lynnda Child, MD

## 2019-11-11 NOTE — Patient Instructions (Signed)
#Foot pain  - try exercises below  #Blood pressure - increase amlodipine to 10 mg daily - return in 1 month for recheck  #Stomach pain - contact your GI doctor to check in  #High Prostate lab test - Go to the Urologist to discuss options and work-up   Plantar Fasciitis Rehab Ask your health care provider which exercises are safe for you. Do exercises exactly as told by your health care provider and adjust them as directed. It is normal to feel mild stretching, pulling, tightness, or discomfort as you do these exercises. Stop right away if you feel sudden pain or your pain gets worse. Do not begin these exercises until told by your health care provider. Stretching and range-of-motion exercises These exercises warm up your muscles and joints and improve the movement and flexibility of your foot. These exercises also help to relieve pain. Plantar fascia stretch  1. Sit with your left / right leg crossed over your opposite knee. 2. Hold your heel with one hand with that thumb near your arch. With your other hand, hold your toes and gently pull them back toward the top of your foot. You should feel a stretch on the bottom of your toes or your foot (plantar fascia) or both. 3. Hold this stretch for__________ seconds. 4. Slowly release your toes and return to the starting position. Repeat __________ times. Complete this exercise __________ times a day. Gastrocnemius stretch, standing This exercise is also called a calf (gastroc) stretch. It stretches the muscles in the back of the upper calf. 1. Stand with your hands against a wall. 2. Extend your left / right leg behind you, and bend your front knee slightly. 3. Keeping your heels on the floor and your back knee straight, shift your weight toward the wall. Do not arch your back. You should feel a gentle stretch in your upper left / right calf. 4. Hold this position for __________ seconds. Repeat __________ times. Complete this exercise  __________ times a day. Soleus stretch, standing This exercise is also called a calf (soleus) stretch. It stretches the muscles in the back of the lower calf. 1. Stand with your hands against a wall. 2. Extend your left / right leg behind you, and bend your front knee slightly. 3. Keeping your heels on the floor, bend your back knee and shift your weight slightly over your back leg. You should feel a gentle stretch deep in your lower calf. 4. Hold this position for __________ seconds. Repeat __________ times. Complete this exercise __________ times a day. Gastroc and soleus stretch, standing step This exercise stretches the muscles in the back of the lower leg. These muscles are in the upper calf (gastrocnemius) and the lower calf (soleus). 1. Stand with the ball of your left / right foot on a step. The ball of your foot is on the walking surface, right under your toes. 2. Keep your other foot firmly on the same step. 3. Hold on to the wall or a railing for balance. 4. Slowly lift your other foot, allowing your body weight to press your left / right heel down over the edge of the step. You should feel a stretch in your left / right calf. 5. Hold this position for __________ seconds. 6. Return both feet to the step. 7. Repeat this exercise with a slight bend in your left / right knee. Repeat __________ times with your left / right knee straight and __________ times with your left / right knee bent.  Complete this exercise __________ times a day. Balance exercise This exercise builds your balance and strength control of your arch to help take pressure off your plantar fascia. Single leg stand If this exercise is too easy, you can try it with your eyes closed or while standing on a pillow. 1. Without shoes, stand near a railing or in a doorway. You may hold on to the railing or door frame as needed. 2. Stand on your left / right foot. Keep your big toe down on the floor and try to keep your arch  lifted. Do not let your foot roll inward. 3. Hold this position for __________ seconds. Repeat __________ times. Complete this exercise __________ times a day. This information is not intended to replace advice given to you by your health care provider. Make sure you discuss any questions you have with your health care provider. Document Revised: 01/28/2019 Document Reviewed: 08/05/2018 Elsevier Patient Education  [place].

## 2019-11-11 NOTE — Assessment & Plan Note (Signed)
Referral to urology to discuss options and further work-up

## 2019-11-23 ENCOUNTER — Telehealth: Payer: Self-pay

## 2019-11-23 NOTE — Telephone Encounter (Signed)
Walter Edwards called to get the name of GI physical he saw back in 03/2019.  Still having some issues & his Cardiologist wants him to go back there.  [facility] [address] [place]  [address] Phone:  [phone]  Asked what he decided about Urologist Referral that [name], PA-C wanted him to have. States PSA still elevated in labs he had with [facility]. He has an appt 12/22/19 with a Urologist in [place] - can't remember the name of the physician.  Will send it to [place].  AMD

## 2019-11-24 ENCOUNTER — Other Ambulatory Visit: Payer: Self-pay | Admitting: Podiatry

## 2019-11-24 ENCOUNTER — Encounter: Payer: Self-pay | Admitting: Podiatry

## 2019-11-24 ENCOUNTER — Ambulatory Visit (INDEPENDENT_AMBULATORY_CARE_PROVIDER_SITE_OTHER): Payer: Managed Care, Other (non HMO) | Admitting: Podiatry

## 2019-11-24 ENCOUNTER — Ambulatory Visit (INDEPENDENT_AMBULATORY_CARE_PROVIDER_SITE_OTHER): Payer: Managed Care, Other (non HMO)

## 2019-11-24 ENCOUNTER — Other Ambulatory Visit: Payer: Self-pay

## 2019-11-24 VITALS — BP 132/100 | HR 83 | Temp 97.2°F | Resp 16

## 2019-11-24 DIAGNOSIS — M722 Plantar fascial fibromatosis: Secondary | ICD-10-CM

## 2019-11-24 DIAGNOSIS — M79671 Pain in right foot: Secondary | ICD-10-CM | POA: Diagnosis not present

## 2019-11-24 DIAGNOSIS — M79672 Pain in left foot: Secondary | ICD-10-CM

## 2019-11-24 MED ORDER — DICLOFENAC SODIUM 75 MG PO TBEC: 75.0000 mg | DELAYED_RELEASE_TABLET | Freq: Two times a day (BID) | ORAL | 2 refills | Status: DC

## 2019-11-24 NOTE — Progress Notes (Signed)
   Subjective:    Patient ID: Walter Edwards, male    DOB: 1971-06-16, 49 y.o.   MRN: [id number]  HPI    Review of Systems  All other systems reviewed and are negative.      Objective:   Physical Exam        Assessment & Plan:

## 2019-11-24 NOTE — Patient Instructions (Signed)

## 2019-11-24 NOTE — Progress Notes (Signed)
Subjective:   Patient ID: Walter Edwards, male   DOB: 49 y.o.   MRN: [id number]   HPI Patient presents stating that the left heel has really been bothering him trying to lose weight and is trying to be active and has been running 5 to 6 miles a day and hiking and has really stopped the running but would like to be able to hike again.  Patient states is been bed for a while and if 2 months ago he really traumatized the left foot.  Patient does not smoke likes to be active   Review of Systems  All other systems reviewed and are negative.       Objective:  Physical Exam Vitals and nursing note reviewed.  Constitutional:      Appearance: He is well-developed.  Pulmonary:     Effort: Pulmonary effort is normal.  Musculoskeletal:        General: Normal range of motion.  Skin:    General: Skin is warm.  Neurological:     Mental Status: He is alert.     Neurovascular status found to be intact muscle strength found to be adequate range of motion within normal limits.  Patient is found to have exquisite discomfort plantar aspect left heel at the insertional point tendon into the calcaneus and moderate arch pain bilateral with both being inflamed with activity.  Patient is found to have good digital perfusion and is well oriented x3     Assessment:  Acute plantar fasciitis left with inflammation fluid along with arch fasciitis bilateral     Plan:  H&P both conditions reviewed and today I did sterile prep and injected the left fascia 3 mg Kenalog 5 mg Xylocaine and applied brace bilateral to lift up the arches.  Gave instructions on shoe gear support physical therapy and prescribed diclofenac 75 mg twice daily.  Reappoint to recheck in 2 weeks  X-rays indicate there is moderate depression of the arch no indications of advanced spur at the current time

## 2019-11-28 NOTE — Progress Notes (Signed)
Cardiology Office Note:   Date:  11/29/2019  NAME:  Walter Edwards    MRN: [id number] DOB:  June 01, 1971   PCP:  Lynnda Child, MD  Cardiologist:  Reatha Harps, MD    Referring MD: No ref. provider found   Chief Complaint  Patient presents with  . Chest Pain   History of Present Illness:   Walter Edwards is a 49 y.o. male with a hx of hypertension who presents for follow-up of chest pain.  He was evaluated in October for atypical chest pain.  The pain was described as dull constant lasting several days.  He was evaluated the emergency room in October 2020 which showed a normal EKG without ischemic changes and negative troponin values.  He was noted to have elevated blood pressure that was associated with the chest pain.  He was recently started on blood pressure medications and referred to [place] in October.  At that time, he was maintaining a very high level of physical activity walking up to 4 to 6 miles per day.  We opted to allow for blood pressure control and to monitor his symptoms rather than pursuing an ischemia evaluation.  He reports no further episodes of chest pain.  He reports has been doing well since starting his blood pressure medication amlodipine.  He is recently established with a [facility] care physician who has taken much more ownership in his care.  His amlodipine was recently increased to 10 mg daily.  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  He also reports he is had daily episodes of what sounds like gastritis.  He reports a burning stabbing pain in his epigastric area.  It appears to be worsened by certain foods and during the day.  He reports that defecation actually improves his symptoms.  He reports that he has had some pain and this is likely why his blood pressure is elevated today.  He reports his primary care physician will manage his blood pressure as well as any cholesterol  issues should they arise.  He presents for follow-up of this today.  Past Medical History: Past Medical History:  Diagnosis Date  . Allergy   . GERD (gastroesophageal reflux disease)   . Hyperlipidemia   . Hypertension   . Kidney stones   . Low HDL (under 40)   . Mild depression (HCC)   . Otitis media   . Seasonal rhinitis     Past Surgical History: Past Surgical History:  Procedure Laterality Date  . BACK SURGERY     x3    Current Medications: Current Meds  Medication Sig  . amLODipine (NORVASC) 10 MG tablet Take 1 tablet (10 mg total) by mouth daily.  . clotrimazole (ATHLETES FOOT, CLOTRIMAZOLE,) 1 % cream Apply 1 application topically 2 (two) times daily.  . diclofenac (VOLTAREN) 75 MG EC tablet Take 1 tablet (75 mg total) by mouth 2 (two) times daily.  Marland Kitchen docusate sodium (COLACE) 100 MG capsule Take 200 mg by mouth 2 (two) times daily.  Marland Kitchen HYDROcodone-acetaminophen (NORCO/VICODIN) 5-325 MG tablet Take 1 tablet by mouth every 6 (six) hours as needed for moderate pain.  Marland Kitchen ipratropium (ATROVENT) 0.06 % nasal spray Place 2 sprays into both nostrils 3 (three) times daily.  Marland Kitchen loratadine (CLARITIN) 10 MG tablet Take 1 tablet (10 mg total) by mouth daily.  . montelukast (SINGULAIR) 10 MG tablet TAKE  1 TABLET BY MOUTH EVERY MORNING FORALLERGIES  . Multiple Vitamin (MULTIVITAMIN) tablet Take 1 tablet by mouth daily.  . pantoprazole (PROTONIX) 40 MG tablet Take 1 tablet (40 mg total) by mouth daily.  . polycarbophil (FIBERCON) 625 MG tablet Take 1,250 mg by mouth daily.     Allergies:    Tessalon perles [benzonatate] and Hydrocodone   Social History: Social History   Socioeconomic History  . Marital status: Divorced    Spouse name: Not on file  . Number of children: 1  . Years of education: [employer]  . Highest education level: Not on file  Occupational History  . Not on file  Tobacco Use  . Smoking status: Never Smoker  . Smokeless tobacco: Former Dance movement psychotherapist and Sexual Activity  . Alcohol use: Yes    Comment: on the weekends maybe  . Drug use: Never  . Sexual activity: Yes    Birth control/protection: Condom  Other Topics Concern  . 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 Difficulty of Paying Living Expenses: Not on file  Food Insecurity:   . Worried About Charity fundraiser in the Last Year: Not on file  . Ran Out of Food in the Last Year: Not on file  Transportation Needs:   . Lack of Transportation (Medical): Not on file  . Lack of Transportation (Non-Medical): Not on file  Physical Activity:   . Days of Exercise per Week: Not on file  . Minutes of Exercise per Session: Not on file  Stress:   . Feeling of Stress : Not on file  Social Connections:   . Frequency of Communication with Friends and Family: Not on file  . Frequency of Social Gatherings with Friends and Family: Not on file  . Attends Religious Services: Not on file  . Active Member of Clubs or Organizations: Not on file  . Attends Archivist Meetings: Not on file  . Marital Status: Not on file     Family History: The patient's family history includes Anxiety disorder in his mother; Diabetes in his father and mother; Hypertension in his father, mother, and sister; Obesity in his sister.  ROS:   All other ROS reviewed and negative. Pertinent positives noted in the HPI.     EKGs/Labs/Other Studies Reviewed:   The following studies were personally  reviewed by me today:  Recent Labs: 10/11/2019: ALT 41; BUN 11; Creatinine, Ser 0.99; Hemoglobin 16.4; Platelets 277; Potassium 5.0; Sodium 141; TSH 2.610   Recent Lipid Panel    Component Value Date/Time   CHOL 204 (H) 10/11/2019 0852   TRIG 68 10/11/2019 0852   HDL 47 10/11/2019 0852   CHOLHDL 4.3 10/11/2019 0852   LDLCALC 145 (H) 10/11/2019 0852    Physical Exam:   VS:  BP Marland Kitchen)  150/99   Pulse 78   Temp 98.1 F (36.7 C)   Ht 5' 9.75" (1.772 m)   Wt 220 lb (99.8 kg)   SpO2 98%   BMI 31.79 kg/m    Wt Readings from Last 3 Encounters:  11/29/19 220 lb (99.8 kg)  11/11/19 216 lb 8 oz (98.2 kg)  10/11/19 216 lb (98 kg)    General: Well nourished, well developed, in no acute distress Heart: Atraumatic, normal size  Eyes: PEERLA, EOMI  Neck: Supple, no JVD Endocrine: No thryomegaly Cardiac: Normal S1, S2; RRR; no murmurs, rubs, or gallops Lungs: Clear to auscultation bilaterally, no wheezing, rhonchi or rales  Abd: Soft, nontender, no hepatomegaly  Ext: No edema, pulses 2+ Musculoskeletal: No deformities, BUE and BLE strength normal and equal Skin: Warm and dry, no rashes   Neuro: Alert and oriented to person, place, time, and situation, CNII-XII grossly intact, no focal deficits  Psych: Normal mood and affect   ASSESSMENT:   Walter Edwards is a 50 y.o. male who presents for the following: 1. Other chest pain   2. Essential hypertension     PLAN:   1. Other chest pain -He was initially evaluated for atypical chest pain in setting of newly diagnosed hypertension.  Blood pressure has been treated and symptoms have resolved.  His symptoms appear to be now gastritis related.  He reports epigastric pain that is worse with certain foods and relieved with defecation.  He has plans to see a gastroenterologist next month.  Given that he has had no real chest pain since his episode, he reports he is not interested in an ischemia evaluation.  I think this is quite reasonable given  his atypical symptoms that were likely blood pressure related.  We will see him back on an as-needed basis.  2. Essential hypertension -Blood pressure a bit elevated today 150/99.  He reports poor sleep last night.  He also is dealing with gastritis.  I will defer any medication changes to his primary care physician.  We will see him on an as-needed basis.   Disposition: Return if symptoms worsen or fail to improve.  Medication Adjustments/Labs and Tests Ordered: Current medicines are reviewed at length with the patient today.  Concerns regarding medicines are outlined above.  No orders of the defined types were placed in this encounter.  No orders of the defined types were placed in this encounter.   Patient Instructions  Medication Instructions:  The current medical regimen is effective;  continue present plan and medications.  *If you need a refill on your cardiac medications before your next appointment, please call your pharmacy*  Follow-Up: At [facility], you and your health needs are our priority.  As part of our continuing mission to provide you with exceptional heart care, we have created designated Provider Care Teams.  These Care Teams include your primary Cardiologist (physician) and Advanced Practice Providers (APPs -  Physician Assistants and Nurse Practitioners) who all work together to provide you with the care you need, when you need it.  Your next appointment:   As needed  The format for your next appointment:   Either In Person or Virtual  Provider:   Lennie Odor, MD        Signed, Lenna Gilford. Flora Lipps, MD [facility]  [address] [place], [place] [address] [phone]  11/29/2019 10:32 AM

## 2019-11-29 ENCOUNTER — Encounter: Payer: Self-pay | Admitting: Cardiovascular Disease

## 2019-11-29 ENCOUNTER — Ambulatory Visit (INDEPENDENT_AMBULATORY_CARE_PROVIDER_SITE_OTHER): Payer: Managed Care, Other (non HMO) | Admitting: Cardiovascular Disease

## 2019-11-29 ENCOUNTER — Other Ambulatory Visit: Payer: Self-pay

## 2019-11-29 VITALS — BP 150/99 | HR 78 | Temp 98.1°F | Ht 69.75 in | Wt 220.0 lb

## 2019-11-29 DIAGNOSIS — I10 Essential (primary) hypertension: Secondary | ICD-10-CM | POA: Diagnosis not present

## 2019-11-29 DIAGNOSIS — R0789 Other chest pain: Secondary | ICD-10-CM | POA: Diagnosis not present

## 2019-11-29 NOTE — Patient Instructions (Signed)
Medication Instructions:  The current medical regimen is effective;  continue present plan and medications.  *If you need a refill on your cardiac medications before your next appointment, please call your pharmacy*  Follow-Up: At [facility], you and your health needs are our priority.  As part of our continuing mission to provide you with exceptional heart care, we have created designated Provider Care Teams.  These Care Teams include your primary Cardiologist (physician) and Advanced Practice Providers (APPs -  Physician Assistants and Nurse Practitioners) who all work together to provide you with the care you need, when you need it.  Your next appointment:   As needed  The format for your next appointment:   Either In Person or Virtual  Provider:   Genola O'Neal, MD   

## 2019-12-02 ENCOUNTER — Ambulatory Visit: Payer: Self-pay | Admitting: Urology

## 2019-12-08 ENCOUNTER — Encounter: Payer: Self-pay | Admitting: Podiatry

## 2019-12-08 ENCOUNTER — Ambulatory Visit (INDEPENDENT_AMBULATORY_CARE_PROVIDER_SITE_OTHER): Payer: Managed Care, Other (non HMO) | Admitting: Podiatry

## 2019-12-08 ENCOUNTER — Other Ambulatory Visit: Payer: Self-pay

## 2019-12-08 VITALS — Temp 98.2°F

## 2019-12-08 DIAGNOSIS — M722 Plantar fascial fibromatosis: Secondary | ICD-10-CM | POA: Diagnosis not present

## 2019-12-08 NOTE — Progress Notes (Signed)
Subjective:   Patient ID: Walter Edwards, male   DOB: 49 y.o.   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  Patient understands everything is outlined and consent form for this and will be seen back for [place] to recheck when orthotics dispensed or earlier if needed

## 2019-12-21 DIAGNOSIS — R972 Elevated prostate specific antigen [PSA]: Secondary | ICD-10-CM | POA: Diagnosis not present

## 2019-12-24 ENCOUNTER — Other Ambulatory Visit: Payer: Self-pay | Admitting: Physician Assistant

## 2019-12-24 DIAGNOSIS — R972 Elevated prostate specific antigen [PSA]: Secondary | ICD-10-CM

## 2019-12-24 DIAGNOSIS — R1011 Right upper quadrant pain: Secondary | ICD-10-CM

## 2019-12-24 DIAGNOSIS — K5900 Constipation, unspecified: Secondary | ICD-10-CM | POA: Diagnosis not present

## 2019-12-24 LAB — CBC AND DIFFERENTIAL
HCT: 45 (ref 41–53)
Hemoglobin: 15.4 (ref 13.5–17.5)
Platelets: 269 (ref 150–399)
WBC: 6.9

## 2019-12-24 LAB — BASIC METABOLIC PANEL
BUN: 18 (ref 4–21)
CO2: 30 — AB (ref 13–22)
Chloride: 104 (ref 99–108)
Creatinine: 1 (ref 0.6–1.3)
Glucose: 90
Potassium: 4.2 (ref 3.4–5.3)
Sodium: 141 (ref 137–147)

## 2019-12-24 LAB — HEPATIC FUNCTION PANEL
ALT: 38 (ref 10–40)
AST: 19 (ref 14–40)
Alkaline Phosphatase: 76 (ref 25–125)
Bilirubin, Total: 0.5

## 2019-12-24 LAB — COMPREHENSIVE METABOLIC PANEL: Calcium: 9.3 (ref 8.7–10.7)

## 2019-12-24 LAB — CBC: RBC: 4.93 (ref 3.87–5.11)

## 2019-12-28 ENCOUNTER — Telehealth: Payer: Self-pay | Admitting: Family Medicine

## 2019-12-28 NOTE — Telephone Encounter (Signed)
Pt called stating he has appointment 3/10 for referral up.   He has seen urology and GI  On 3/16 scheduled CT Biopsy 4/30 And won't get any results back until may Does he need to keep this appointment on re-schedule in may  Pt stated everything is the same as it was when he saw you.  He stated everything was good   Please advise

## 2019-12-28 NOTE — Telephone Encounter (Signed)
Left detailed message on patient's voicemail with recommendations from Dr Cody-ok per DPR on file

## 2019-12-28 NOTE — Telephone Encounter (Signed)
Follow-up appointment is for hypertension. I would recommend he keep it so we can make changes as needed

## 2019-12-29 ENCOUNTER — Encounter: Payer: Self-pay | Admitting: Family Medicine

## 2019-12-29 ENCOUNTER — Other Ambulatory Visit: Payer: Self-pay

## 2019-12-29 ENCOUNTER — Ambulatory Visit (INDEPENDENT_AMBULATORY_CARE_PROVIDER_SITE_OTHER): Payer: 59 | Admitting: Family Medicine

## 2019-12-29 VITALS — BP 126/89 | HR 85 | Temp 98.0°F | Ht 69.75 in | Wt 221.0 lb

## 2019-12-29 DIAGNOSIS — R1011 Right upper quadrant pain: Secondary | ICD-10-CM | POA: Diagnosis not present

## 2019-12-29 DIAGNOSIS — I10 Essential (primary) hypertension: Secondary | ICD-10-CM | POA: Diagnosis not present

## 2019-12-29 DIAGNOSIS — R972 Elevated prostate specific antigen [PSA]: Secondary | ICD-10-CM

## 2019-12-29 NOTE — Patient Instructions (Addendum)
#   High blood pressure - continue Amlodipine - return in 3 months - if blood pressure consistently >140/90 let me know   I will get records from urology and GI

## 2019-12-29 NOTE — Assessment & Plan Note (Signed)
BP improved. Given persistent pain will continue to monitor w/o changes. If consistently elevated, will likely add second medication otherwise f/u in 3-4 months for re-check and after work-up complete.

## 2019-12-29 NOTE — Progress Notes (Signed)
   Subjective:     Walter Edwards is a 49 y.o. 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 - 130/80s on good days - occasionally 140/90s - cardiologist said everything was [facility] - has not taken his medication today - takes amlodipine at lunch time  #Side pain - concerned it is GB - getting a CT - seeing GI - considering muscles in rib cage - on linzess which seems to be working - has free samples currently and thinks this is helping the constipation but no change in pain   Review of Systems  11/11/19: Clinic - HTN - increased amlodipine  Social History   Tobacco Use  Smoking Status Never Smoker  Smokeless Tobacco Former User        Objective:    BP Readings from Last 3 Encounters:  12/29/19 126/89  11/29/19 (!) 150/99  11/24/19 (!) 132/100   Wt Readings from Last 3 Encounters:  12/29/19 221 lb (100.2 kg)  11/29/19 220 lb (99.8 kg)  11/11/19 216 lb 8 oz (98.2 kg)    BP 126/89 (BP Location: Right Arm, Patient Position: Sitting, Cuff Size: Large)   Pulse 85   Temp 98 F (36.7 C)   Ht 5' 9.75" (1.772 m)   Wt 221 lb (100.2 kg)   SpO2 98%   BMI 31.94 kg/m    Physical Exam Constitutional:      Appearance: Normal appearance. He is not ill-appearing or diaphoretic.  HENT:     Right Ear: External ear normal.     Left Ear: External ear normal.     Nose: Nose normal.  Eyes:     General: No scleral icterus.    Extraocular Movements: Extraocular movements intact.     Conjunctiva/sclera: Conjunctivae normal.  Cardiovascular:     Rate and Rhythm: Normal rate and regular rhythm.     Heart sounds: No murmur.  Pulmonary:     Effort: Pulmonary effort is normal. No respiratory distress.     Breath sounds: Normal breath sounds. No wheezing.  Musculoskeletal:     Cervical back: Neck supple.  Skin:    General: Skin is warm and dry.  Neurological:     Mental Status: He is alert. Mental status is at  baseline.  Psychiatric:        Mood and Affect: Mood normal.           Assessment & Plan:   Problem List Items Addressed This Visit      Cardiovascular and Mediastinum   Essential hypertension - Primary    BP improved. Given persistent pain will continue to monitor w/o changes. If consistently elevated, will likely add second medication otherwise f/u in 3-4 months for re-check and after work-up complete.         Other   Elevated PSA, less than 10 ng/ml    Reviewed Urology note: elevated PSA with prior normals. Advised biopsy and scheduled for 02/18/2020      RUQ abdominal pain    Seeing GI, new treatment for constipation which is helping but no change in side pain. Prior RUQ [place] normal. Getting CT next week. Will obtain GI records.           Return in about 4 months (around 04/29/2020).  Lynnda Child, MD

## 2019-12-29 NOTE — Assessment & Plan Note (Signed)
Seeing GI, new treatment for constipation which is helping but no change in side pain. Prior RUQ [place] normal. Getting CT next week. Will obtain GI records.

## 2019-12-29 NOTE — Assessment & Plan Note (Signed)
Reviewed Urology note: elevated PSA with prior normals. Advised biopsy and scheduled for 02/18/2020

## 2019-12-31 ENCOUNTER — Other Ambulatory Visit: Payer: Self-pay | Admitting: Physician Assistant

## 2020-01-04 ENCOUNTER — Ambulatory Visit
Admission: RE | Admit: 2020-01-04 | Discharge: 2020-01-04 | Disposition: A | Discharge status: Home / Self Care | Payer: 59 | Source: Ambulatory Visit | Attending: Physician Assistant | Admitting: Physician Assistant

## 2020-01-04 ENCOUNTER — Other Ambulatory Visit: Payer: Self-pay | Admitting: Internal Medicine

## 2020-01-04 ENCOUNTER — Encounter: Payer: Managed Care, Other (non HMO) | Admitting: Orthotics

## 2020-01-04 DIAGNOSIS — R972 Elevated prostate specific antigen [PSA]: Secondary | ICD-10-CM

## 2020-01-04 DIAGNOSIS — R1011 Right upper quadrant pain: Secondary | ICD-10-CM

## 2020-01-04 MED ORDER — IOPAMIDOL (ISOVUE-300) INJECTION 61%
100.0000 mL | Freq: Once | INTRAVENOUS | Status: AC | PRN
  Administered 2020-01-04: 100 mL via INTRAVENOUS

## 2020-01-05 NOTE — Telephone Encounter (Signed)
Lexander has established new PCP with Gweneth Dimitri, MD of [facility].  Transferred Rx refill request that we CBP - Physicians received.  AMD

## 2020-01-10 ENCOUNTER — Other Ambulatory Visit: Payer: Self-pay

## 2020-01-10 ENCOUNTER — Ambulatory Visit (INDEPENDENT_AMBULATORY_CARE_PROVIDER_SITE_OTHER): Payer: 59 | Admitting: Podiatry

## 2020-01-10 ENCOUNTER — Encounter: Payer: Self-pay | Admitting: Podiatry

## 2020-01-10 VITALS — Temp 97.3°F

## 2020-01-10 DIAGNOSIS — M722 Plantar fascial fibromatosis: Secondary | ICD-10-CM | POA: Diagnosis not present

## 2020-01-10 DIAGNOSIS — M2141 Flat foot [pes planus] (acquired), right foot: Secondary | ICD-10-CM

## 2020-01-10 DIAGNOSIS — M2142 Flat foot [pes planus] (acquired), left foot: Secondary | ICD-10-CM | POA: Diagnosis not present

## 2020-01-11 ENCOUNTER — Encounter: Payer: Self-pay | Admitting: Podiatry

## 2020-01-11 NOTE — Progress Notes (Signed)
Subjective:  Patient ID: Walter Edwards, male    DOB: June 11, 1971,  MRN: [id number]  Chief Complaint  Patient presents with  . Plantar Fasciitis    "my right foot is fine now, but my left foot is still sore"    49 y.o. male presents with the above complaint.  Patient presents with a follow-up of left plantar fasciitis.  He states that the injection that was given last time by Dr. Paulla Dolly had helped him tremendously.  Patient states that the right side is fine but the left side is still sore.  The bracing did not work and he works for the [employer] and therefore this brace keeps slipping off.  He would like to know if there is another injection that could be given as the injections are helping.  He is going be picking up his orthotics on April 1.  I discussed shoe gear modification with him.   Review of Systems: Negative except as noted in the HPI. Denies N/V/F/Ch.  Past Medical History:  Diagnosis Date  . Allergy   . GERD (gastroesophageal reflux disease)   . Hyperlipidemia   . Hypertension   . Kidney stones   . Low HDL (under 40)   . Mild depression ([place])   . Otitis media   . Seasonal rhinitis     Current Outpatient Medications:  .  amLODipine (NORVASC) 10 MG tablet, Take 1 tablet (10 mg total) by mouth daily., Disp: 90 tablet, Rfl: 3 .  HYDROcodone-acetaminophen (NORCO/VICODIN) 5-325 MG tablet, Take 1 tablet by mouth every 6 (six) hours as needed for moderate pain., Disp: , Rfl:  .  ipratropium (ATROVENT) 0.06 % nasal spray, Place 2 sprays into both nostrils 3 (three) times daily., Disp: 15 mL, Rfl: 3 .  linaclotide (LINZESS) 72 MCG capsule, Take 72 mcg by mouth daily before breakfast., Disp: , Rfl:  .  loratadine (CLARITIN) 10 MG tablet, Take 1 tablet (10 mg total) by mouth daily., Disp: 30 tablet, Rfl: 5 .  montelukast (SINGULAIR) 10 MG tablet, TAKE 1 TABLET BY MOUTH EVERY MORNING FORALLERGIES, Disp: 30 tablet, Rfl: 5 .  Multiple Vitamin (MULTIVITAMIN) tablet, Take 1 tablet  by mouth daily., Disp: , Rfl:  .  pantoprazole (PROTONIX) 40 MG tablet, Take 1 tablet (40 mg total) by mouth daily., Disp: 30 tablet, Rfl: 3  Social History   Tobacco Use  Smoking Status Never Smoker  Smokeless Tobacco Former Systems developer    Allergies  Allergen Reactions  . Tessalon Perles [Benzonatate]     itching  . Hydrocodone Itching   Objective:   Vitals:   01/10/20 1537  Temp: (!) 97.3 F (36.3 C)   There is no height or weight on file to calculate BMI. Constitutional Well developed. Well nourished.  Vascular Dorsalis pedis pulses palpable bilaterally. Posterior tibial pulses palpable bilaterally. Capillary refill normal to all digits.  No cyanosis or clubbing noted. Pedal hair growth normal.  Neurologic Normal speech. Oriented to person, place, and time. Epicritic sensation to light touch grossly present bilaterally.  Dermatologic Nails well groomed and normal in appearance. No open wounds. No skin lesions.  Orthopedic: Normal joint ROM without pain or crepitus bilaterally. No visible deformities. Tender to palpation at the calcaneal tuber left. No pain with calcaneal squeeze left. Ankle ROM diminished range of motion left. Silfverskiold Test: positive left.   Radiographs: None  Assessment:   1. Plantar fasciitis of left foot   2. Pes planus of both feet    Plan:  Patient  was evaluated and treated and all questions answered.  Plantar Fasciitis, left - XR reviewed as above.  - Educated on icing and stretching. Instructions given.  - Injection delivered to the plantar fascia as below. - DME: None - Pharmacologic management: None  Procedure: Injection Tendon/Ligament Location: Left plantar fascia at the glabrous junction; medial approach. Skin Prep: alcohol Injectate: 0.5 cc 0.5% marcaine plain, 0.5 cc of 1% Lidocaine, 0.5 cc kenalog 10. Disposition: Patient tolerated procedure well. Injection site dressed with a band-aid.  No follow-ups on file.

## 2020-01-12 ENCOUNTER — Encounter: Payer: Self-pay | Admitting: Physician Assistant

## 2020-01-17 ENCOUNTER — Other Ambulatory Visit: Payer: Self-pay | Admitting: *Deleted

## 2020-01-17 MED ORDER — PANTOPRAZOLE SODIUM 40 MG PO TBEC: 40.0000 mg | DELAYED_RELEASE_TABLET | Freq: Every day | ORAL | 1 refills | Status: DC

## 2020-01-17 NOTE — Telephone Encounter (Signed)
Patient called stating that he has been trying to get a refill on is heart burn medication. Patient stated that this medication was given to him by an ER doctor. Patient stated that he is requesting that Dr. Selena Batten send a new script in for him. Patient stated that [facility] is sending another request over for this today. Pharmacy [facility]

## 2020-01-20 ENCOUNTER — Encounter: Payer: 59 | Admitting: Orthotics

## 2020-01-20 ENCOUNTER — Other Ambulatory Visit: Payer: Self-pay

## 2020-01-26 ENCOUNTER — Other Ambulatory Visit (HOSPITAL_COMMUNITY): Payer: Self-pay | Admitting: Physician Assistant

## 2020-01-26 ENCOUNTER — Other Ambulatory Visit: Payer: Self-pay | Admitting: Physician Assistant

## 2020-01-26 DIAGNOSIS — R1011 Right upper quadrant pain: Secondary | ICD-10-CM

## 2020-01-26 DIAGNOSIS — K5900 Constipation, unspecified: Secondary | ICD-10-CM | POA: Diagnosis not present

## 2020-01-26 DIAGNOSIS — K760 Fatty (change of) liver, not elsewhere classified: Secondary | ICD-10-CM | POA: Diagnosis not present

## 2020-02-03 ENCOUNTER — Other Ambulatory Visit: Payer: Self-pay | Admitting: Physician Assistant

## 2020-02-03 DIAGNOSIS — J302 Other seasonal allergic rhinitis: Secondary | ICD-10-CM

## 2020-02-09 ENCOUNTER — Encounter (HOSPITAL_COMMUNITY)
Admission: RE | Admit: 2020-02-09 | Discharge: 2020-02-09 | Disposition: A | Discharge status: Home / Self Care | Payer: 59 | Source: Ambulatory Visit | Attending: Physician Assistant | Admitting: Physician Assistant

## 2020-02-09 ENCOUNTER — Other Ambulatory Visit: Payer: Self-pay

## 2020-02-09 DIAGNOSIS — R1011 Right upper quadrant pain: Secondary | ICD-10-CM | POA: Insufficient documentation

## 2020-02-09 MED ORDER — TECHNETIUM TC 99M MEBROFENIN IV KIT
5.3000 | PACK | Freq: Once | INTRAVENOUS | Status: AC | PRN
  Administered 2020-02-09: 5.3 via INTRAVENOUS

## 2020-02-18 DIAGNOSIS — R972 Elevated prostate specific antigen [PSA]: Secondary | ICD-10-CM | POA: Diagnosis not present

## 2020-02-24 DIAGNOSIS — R972 Elevated prostate specific antigen [PSA]: Secondary | ICD-10-CM | POA: Diagnosis not present

## 2020-02-28 DIAGNOSIS — K5900 Constipation, unspecified: Secondary | ICD-10-CM | POA: Diagnosis not present

## 2020-02-28 DIAGNOSIS — R1011 Right upper quadrant pain: Secondary | ICD-10-CM | POA: Diagnosis not present

## 2020-03-08 ENCOUNTER — Telehealth: Payer: Self-pay | Admitting: *Deleted

## 2020-03-08 DIAGNOSIS — J302 Other seasonal allergic rhinitis: Secondary | ICD-10-CM

## 2020-03-08 MED ORDER — LORATADINE 10 MG PO TABS: 10.0000 mg | ORAL_TABLET | Freq: Every day | ORAL | 3 refills | Status: DC

## 2020-03-08 NOTE — Telephone Encounter (Signed)
Patient called stating that he needs a refill on his Claritin. Patient stated that he was getting this from his previous doctor. Patient stated that [facility] told him that they have been trying to get this for him for over a week.

## 2020-03-08 NOTE — Telephone Encounter (Signed)
Tried calling patient x 2 but could not hear the person on the other side.  Wanted to let patient know that medication was refilled and that previous refill request on this medication was going to the other doctor and not us-I can see that in his chart.

## 2020-03-08 NOTE — Telephone Encounter (Signed)
Patient advised of everything. 

## 2020-03-10 DIAGNOSIS — Z1159 Encounter for screening for other viral diseases: Secondary | ICD-10-CM | POA: Diagnosis not present

## 2020-03-14 DIAGNOSIS — R1011 Right upper quadrant pain: Secondary | ICD-10-CM | POA: Diagnosis not present

## 2020-03-14 DIAGNOSIS — K2930 Chronic superficial gastritis without bleeding: Secondary | ICD-10-CM | POA: Diagnosis not present

## 2020-03-21 DIAGNOSIS — K2930 Chronic superficial gastritis without bleeding: Secondary | ICD-10-CM | POA: Diagnosis not present

## 2020-03-29 ENCOUNTER — Encounter: Payer: Self-pay | Admitting: Family Medicine

## 2020-03-29 ENCOUNTER — Ambulatory Visit (INDEPENDENT_AMBULATORY_CARE_PROVIDER_SITE_OTHER): Payer: 59 | Admitting: Family Medicine

## 2020-03-29 ENCOUNTER — Other Ambulatory Visit: Payer: Self-pay

## 2020-03-29 ENCOUNTER — Telehealth: Payer: Self-pay | Admitting: *Deleted

## 2020-03-29 VITALS — BP 118/80 | HR 87 | Temp 98.0°F | Ht 69.75 in | Wt 221.0 lb

## 2020-03-29 DIAGNOSIS — R1011 Right upper quadrant pain: Secondary | ICD-10-CM | POA: Diagnosis not present

## 2020-03-29 DIAGNOSIS — B353 Tinea pedis: Secondary | ICD-10-CM | POA: Insufficient documentation

## 2020-03-29 DIAGNOSIS — J302 Other seasonal allergic rhinitis: Secondary | ICD-10-CM | POA: Insufficient documentation

## 2020-03-29 MED ORDER — FEXOFENADINE HCL 180 MG PO TABS: 180.0000 mg | ORAL_TABLET | Freq: Every day | ORAL | 3 refills | Status: AC

## 2020-03-29 MED ORDER — CLOTRIMAZOLE 1 % EX OINT: TOPICAL_OINTMENT | CUTANEOUS | 1 refills | Status: DC

## 2020-03-29 NOTE — Assessment & Plan Note (Signed)
Previously improved with clotrimazole - refill provided.

## 2020-03-29 NOTE — Patient Instructions (Signed)
#   Abdominal pain  - trial of physical therapy - if no improvement, then you may want to consider surgery  # foot - use cream  #allergies - switch zyrtec to allegra - call if you want to see the specialist

## 2020-03-29 NOTE — Assessment & Plan Note (Signed)
Switch to allegra. Cont singulair. Mychart or call when you want to check in with allergist.

## 2020-03-29 NOTE — Telephone Encounter (Signed)
Patient notified of these directions and he verbalized understanding.

## 2020-03-29 NOTE — Progress Notes (Signed)
Subjective:     Walter Edwards is a 49 y.o. male presenting for Flank Pain     HPI   #Flank/RUQ - s/p nuclear test on GB - endoscopy/colonoscopy - normal prostate biospy - 33-75% is normal rate - and his was in the normal range of function at 40% - no one seems to know what it is - comes in morning/evenings - after eating - will feel something feeling off - will nauseous out of the blue - will also have constipation - advised speaking with me prior to surgery - symptoms are getting worse - every day not feeling well - stopped working out - significant with push mowing - radiating around the side - no improvement with ibuprofen or tylenol - no change with dietary changes - improves with BM  #Left foot - still with athlete's foot - wears boots - still with itching   Review of Systems   Social History   Tobacco Use  Smoking Status Never Smoker  Smokeless Tobacco Former User        Objective:    BP Readings from Last 3 Encounters:  03/29/20 118/80  12/29/19 126/89  11/29/19 (!) 150/99   Wt Readings from Last 3 Encounters:  03/29/20 221 lb (100.2 kg)  12/29/19 221 lb (100.2 kg)  11/29/19 220 lb (99.8 kg)    BP 118/80   Pulse 87   Temp 98 F (36.7 C)   Ht 5' 9.75" (1.772 m)   Wt 221 lb (100.2 kg)   SpO2 97%   BMI 31.94 kg/m    Physical Exam Constitutional:      Appearance: Normal appearance. He is not ill-appearing or diaphoretic.  HENT:     Right Ear: External ear normal.     Left Ear: External ear normal.  Eyes:     General: No scleral icterus.    Extraocular Movements: Extraocular movements intact.     Conjunctiva/sclera: Conjunctivae normal.  Cardiovascular:     Rate and Rhythm: Normal rate.  Pulmonary:     Effort: Pulmonary effort is normal.  Abdominal:     General: Abdomen is flat. There is no distension.     Palpations: Abdomen is soft.     Tenderness: There is no abdominal tenderness (to deep palpation). Negative signs  include Murphy's sign.  Musculoskeletal:     Cervical back: Neck supple.  Skin:    General: Skin is warm and dry.     Comments: Left foot with excoriations   Neurological:     Mental Status: He is alert. Mental status is at baseline.  Psychiatric:        Mood and Affect: Mood normal.           Assessment & Plan:   Problem List Items Addressed This Visit      Other   RUQ abdominal pain - Primary    Following with GI and results so far normal. Symptoms still seem concerning for GB pathology and it is possible surgery will improve symptoms, however, does also get symptoms with physical activity. Referral to PT for evaluation and treatment prior to moving forward with surgery.       Relevant Orders   Ambulatory referral to Physical Therapy    Other Visit Diagnoses    Athlete's foot on left       Relevant Medications   Clotrimazole 1 % OINT   Seasonal allergic rhinitis, unspecified trigger       Relevant Medications   fexofenadine (ALLEGRA)  180 MG tablet       Return if symptoms worsen or fail to improve.  Lesleigh Noe, MD

## 2020-03-29 NOTE — Telephone Encounter (Signed)
Stop Claritin Continue Singulair Start Allegra (sent to pharmacy)  Zyrtec was a mistype

## 2020-03-29 NOTE — Assessment & Plan Note (Signed)
Following with GI and results so far normal. Symptoms still seem concerning for GB pathology and it is possible surgery will improve symptoms, however, does also get symptoms with physical activity. Referral to PT for evaluation and treatment prior to moving forward with surgery.

## 2020-03-29 NOTE — Telephone Encounter (Signed)
Patient left a voicemail stating that he just left the office and is totally confused. Patient stated that his paperwork shows that he was switched to Allegra and told to stop taking Zyrtec. Patient stated that has been taking Singular and Claritin. Patient requested a call back to clarify what he needs to get at the pharmacy.

## 2020-04-07 DIAGNOSIS — M722 Plantar fascial fibromatosis: Secondary | ICD-10-CM | POA: Diagnosis not present

## 2020-04-10 DIAGNOSIS — K219 Gastro-esophageal reflux disease without esophagitis: Secondary | ICD-10-CM | POA: Diagnosis not present

## 2020-04-10 DIAGNOSIS — M722 Plantar fascial fibromatosis: Secondary | ICD-10-CM | POA: Diagnosis not present

## 2020-04-10 DIAGNOSIS — K5900 Constipation, unspecified: Secondary | ICD-10-CM | POA: Diagnosis not present

## 2020-04-10 DIAGNOSIS — R1011 Right upper quadrant pain: Secondary | ICD-10-CM | POA: Diagnosis not present

## 2020-04-12 ENCOUNTER — Telehealth: Payer: Self-pay

## 2020-04-12 NOTE — Telephone Encounter (Signed)
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 Nothing further needed.

## 2020-04-13 DIAGNOSIS — M722 Plantar fascial fibromatosis: Secondary | ICD-10-CM | POA: Diagnosis not present

## 2020-04-18 DIAGNOSIS — M722 Plantar fascial fibromatosis: Secondary | ICD-10-CM | POA: Diagnosis not present

## 2020-04-21 DIAGNOSIS — M722 Plantar fascial fibromatosis: Secondary | ICD-10-CM | POA: Diagnosis not present

## 2020-04-25 DIAGNOSIS — M722 Plantar fascial fibromatosis: Secondary | ICD-10-CM | POA: Diagnosis not present

## 2020-04-27 ENCOUNTER — Other Ambulatory Visit: Payer: Self-pay

## 2020-04-27 ENCOUNTER — Ambulatory Visit (INDEPENDENT_AMBULATORY_CARE_PROVIDER_SITE_OTHER): Payer: 59 | Admitting: Family Medicine

## 2020-04-27 ENCOUNTER — Encounter: Payer: 59 | Admitting: Family Medicine

## 2020-04-27 VITALS — BP 128/86 | HR 81 | Temp 97.9°F | Ht 70.0 in | Wt 223.5 lb

## 2020-04-27 DIAGNOSIS — E782 Mixed hyperlipidemia: Secondary | ICD-10-CM

## 2020-04-27 DIAGNOSIS — Z113 Encounter for screening for infections with a predominantly sexual mode of transmission: Secondary | ICD-10-CM

## 2020-04-27 DIAGNOSIS — Z0000 Encounter for general adult medical examination without abnormal findings: Secondary | ICD-10-CM | POA: Diagnosis not present

## 2020-04-27 DIAGNOSIS — I10 Essential (primary) hypertension: Secondary | ICD-10-CM | POA: Diagnosis not present

## 2020-04-27 DIAGNOSIS — Z114 Encounter for screening for human immunodeficiency virus [HIV]: Secondary | ICD-10-CM | POA: Diagnosis not present

## 2020-04-27 DIAGNOSIS — Z23 Encounter for immunization: Secondary | ICD-10-CM

## 2020-04-27 DIAGNOSIS — R69 Illness, unspecified: Secondary | ICD-10-CM | POA: Diagnosis not present

## 2020-04-27 DIAGNOSIS — Z1159 Encounter for screening for other viral diseases: Secondary | ICD-10-CM | POA: Diagnosis not present

## 2020-04-27 DIAGNOSIS — M722 Plantar fascial fibromatosis: Secondary | ICD-10-CM | POA: Diagnosis not present

## 2020-04-27 LAB — COMPREHENSIVE METABOLIC PANEL
ALT: 22 U/L (ref 0–53)
AST: 16 U/L (ref 0–37)
Albumin: 4.3 g/dL (ref 3.5–5.2)
Alkaline Phosphatase: 79 U/L (ref 39–117)
BUN: 13 mg/dL (ref 6–23)
CO2: 28 mEq/L (ref 19–32)
Calcium: 8.8 mg/dL (ref 8.4–10.5)
Chloride: 106 mEq/L (ref 96–112)
Creatinine, Ser: 0.97 mg/dL (ref 0.40–1.50)
GFR: 82.31 mL/min (ref >60.00)
Glucose, Bld: 104 mg/dL — ABNORMAL HIGH (ref 70–99)
Potassium: 4.2 mEq/L (ref 3.5–5.1)
Sodium: 139 mEq/L (ref 135–145)
Total Bilirubin: 0.4 mg/dL (ref 0.2–1.2)
Total Protein: 6.9 g/dL (ref 6.0–8.3)

## 2020-04-27 LAB — HEMOGLOBIN A1C: Hgb A1c MFr Bld: 5.4 % (ref 4.6–6.5)

## 2020-04-27 LAB — LIPID PANEL
Cholesterol: 187 mg/dL (ref 0–200)
HDL: 36.3 mg/dL — ABNORMAL LOW (ref >39.00)
LDL Cholesterol: 132 mg/dL — ABNORMAL HIGH (ref 0–99)
NonHDL: 150.63
Total CHOL/HDL Ratio: 5
Triglycerides: 95 mg/dL (ref 0.0–149.0)
VLDL: 19 mg/dL (ref 0.0–40.0)

## 2020-04-27 NOTE — Patient Instructions (Signed)
Great to see you   Preventive Care 46-49 Years Old, Male Preventive care refers to lifestyle choices and visits with your health care provider that can promote health and wellness. This includes:  A yearly physical exam. This is also called an annual well check.  Regular dental and eye exams.  Immunizations.  Screening for certain conditions.  Healthy lifestyle choices, such as eating a healthy diet, getting regular exercise, not using drugs or products that contain nicotine and tobacco, and limiting alcohol use. What can I expect for my preventive care visit? Physical exam Your health care provider will check:  Height and weight. These may be used to calculate body mass index (BMI), which is a measurement that tells if you are at a healthy weight.  Heart rate and blood pressure.  Your skin for abnormal spots. Counseling Your health care provider may ask you questions about:  Alcohol, tobacco, and drug use.  Emotional well-being.  Home and relationship well-being.  Sexual activity.  Eating habits.  Work and work Statistician. What immunizations do I need?  Influenza (flu) vaccine  This is recommended every year. Tetanus, diphtheria, and pertussis (Tdap) vaccine  You may need a Td booster every 10 years. Varicella (chickenpox) vaccine  You may need this vaccine if you have not already been vaccinated. Zoster (shingles) vaccine  You may need this after age 4. Measles, mumps, and rubella (MMR) vaccine  You may need at least one dose of MMR if you were born in 1957 or later. You may also need a second dose. Pneumococcal conjugate (PCV13) vaccine  You may need this if you have certain conditions and were not previously vaccinated. Pneumococcal polysaccharide (PPSV23) vaccine  You may need one or two doses if you smoke cigarettes or if you have certain conditions. Meningococcal conjugate (MenACWY) vaccine  You may need this if you have certain  conditions. Hepatitis A vaccine  You may need this if you have certain conditions or if you travel or work in places where you may be exposed to hepatitis A. Hepatitis B vaccine  You may need this if you have certain conditions or if you travel or work in places where you may be exposed to hepatitis B. Haemophilus influenzae type b (Hib) vaccine  You may need this if you have certain risk factors. Human papillomavirus (HPV) vaccine  If recommended by your health care provider, you may need three doses over 6 months. You may receive vaccines as individual doses or as more than one vaccine together in one shot (combination vaccines). Talk with your health care provider about the risks and benefits of combination vaccines. What tests do I need? Blood tests  Lipid and cholesterol levels. These may be checked every 5 years, or more frequently if you are over 26 years old.  Hepatitis C test.  Hepatitis B test. Screening  Lung cancer screening. You may have this screening every year starting at age 31 if you have a 30-pack-year history of smoking and currently smoke or have quit within the past 15 years.  Prostate cancer screening. Recommendations will vary depending on your family history and other risks.  Colorectal cancer screening. All adults should have this screening starting at age 67 and continuing until age 80. Your health care provider may recommend screening at age 44 if you are at increased risk. You will have tests every 1-10 years, depending on your results and the type of screening test.  Diabetes screening. This is done by checking your blood sugar (  glucose) after you have not eaten for a while (fasting). You may have this done every 1-3 years.  Sexually transmitted disease (STD) testing. Follow these instructions at home: Eating and drinking  Eat a diet that includes fresh fruits and vegetables, whole grains, lean protein, and low-fat dairy products.  Take vitamin and  mineral supplements as recommended by your health care provider.  Do not drink alcohol if your health care provider tells you not to drink.  If you drink alcohol: ? Limit how much you have to 0-2 drinks a day. ? Be aware of how much alcohol is in your drink. In the [place] one drink equals one 12 oz bottle of beer (355 mL), one 5 oz glass of wine (148 mL), or one 1 oz glass of hard liquor (44 mL). Lifestyle  Take daily care of your teeth and gums.  Stay active. Exercise for at least 30 minutes on 5 or more days each week.  Do not use any products that contain nicotine or tobacco, such as cigarettes, e-cigarettes, and chewing tobacco. If you need help quitting, ask your health care provider.  If you are sexually active, practice safe sex. Use a condom or other form of protection to prevent STIs (sexually transmitted infections).  Talk with your health care provider about taking a low-dose aspirin every day starting at age 50. What's next?  Go to your health care provider once a year for a well check visit.  Ask your health care provider how often you should have your eyes and teeth checked.  Stay up to date on all vaccines. This information is not intended to replace advice given to you by your health care provider. Make sure you discuss any questions you have with your health care provider. Document Revised: 10/01/2018 Document Reviewed: 10/01/2018 Elsevier Patient Education  2020 [employer]  

## 2020-04-27 NOTE — Progress Notes (Signed)
Annual Exam   Chief Complaint:  Chief Complaint  Patient presents with  . Annual Exam    wants tdap    History of Present Illness:  Walter Edwards is a 49 y.o. presents today for annual examination.    Going to PT for his foot  Nutrition/Lifestyle Diet: not great, due to side hurting - due to appetites issues Exercise: working out at [employer] He is not sexually active.    Social History   Tobacco Use  Smoking Status Never Smoker  Smokeless Tobacco Former Neurosurgeon   Social History   Substance and Sexual Activity  Alcohol Use Yes   Comment: on the weekends maybe   Social History   Substance and Sexual Activity  Drug Use Never     Safety The patient wears seatbelts: yes.     The patient feels safe at home and in their relationships: yes.  General Health Dentist in the last year: Yes Eye doctor: not applicable  Weight Wt Readings from Last 3 Encounters:  04/27/20 223 lb 8 oz (101.4 kg)  03/29/20 221 lb (100.2 kg)  12/29/19 221 lb (100.2 kg)   Patient has high BMI  BMI Readings from Last 1 Encounters:  04/27/20 32.07 kg/m     Chronic disease screening Blood pressure monitoring:  BP Readings from Last 3 Encounters:  04/27/20 128/86  03/29/20 118/80  12/29/19 126/89    Lipid Monitoring: Indication for screening: age >35, obesity, diabetes, family hx, CV risk factors.  Lipid screening: Yes  Lab Results  Component Value Date   CHOL 187 04/27/2020   HDL 36.30 (L) 04/27/2020   LDLCALC 132 (H) 04/27/2020   TRIG 95.0 04/27/2020   CHOLHDL 5 04/27/2020   The 10-year ASCVD risk score Denman George DC Jr., et al., 2013) is: 4.3%   Values used to calculate the score:     Age: 77 years     Sex: Male     Is Non-Hispanic African American: No     Diabetic: No     Tobacco smoker: No     Systolic Blood Pressure: 128 mmHg     Is BP treated: Yes     HDL Cholesterol: 36.3 mg/dL     Total Cholesterol: 187 mg/dL   Diabetes Screening: age >1, overweight, family  hx, PCOS, hx of gestational diabetes, at risk ethnicity, elevated blood pressure >135/80.  Diabetes Screening screening: Yes  Lab Results  Component Value Date   HGBA1C 5.4 04/27/2020     Prostate Cancer Screening: Not Indicated Age 3-69 yo Shared Decision Making Higher Risk: Older age, African American, Family Hx of Prostate Cancer - No Benefits: screening may prevent 1.3 deaths from prostate cancer over 13 years per 1000 men screened and prevent 3 metastatic cases per 1000 men screened. Not enough evidence to support more benefit for AA or FMH Harms: False Positive and psychological harms. 15% of me with false positive over a 2 to 4 year period > resulting in biopsy and complications such as pain, hematospermia, infections. Overdiagnosis - increases with age - found that 20-50% of prostate cancer through screening may have never caused any issues. Harms of treatment include - erectile dysfunction, urinary incontinence, and bothersome bowel symptoms.   After discussion he does not want to get a PSA checked today.   Inadequate evidence for screening <55 No mortality benefit for screening >70   Lab Results  Component Value Date   PSA1 4.6 (H) 10/11/2019    Follows with urology and they  will repeat   Colon Cancer Screening:  Age 21-75 yo - benefits outweigh the risk. Adults 9-85 yo who have never been screened benefit.  Benefits: 134000 people in 2016 will be diagnosed and 49,000 will die - early detection helps Harms: Complications 2/2 to colonoscopy High Risk (Colonoscopy): genetic disorder (Lynch syndrome or familial adenomatous polyposis), personal hx of IBD, previous adenomatous polyp, or previous colorectal cancer, FamHx start 10 years before the age at diagnosis, increased in males and black race  Options:  FIT - looks for hemoglobin (blood in the stool) - specific and fairly sensitive - must be done annually Cologuard - looks for DNA and blood - more sensitive - therefore  can have more false positives, every 3 years Colonoscopy - every 10 years if normal - sedation, bowl prep, must have someone drive you  Shared decision making and the patient had decided to do -- up to date on colonoscopy.   Social History   Tobacco Use  Smoking Status Never Smoker  Smokeless Tobacco Former Neurosurgeon      Past Medical History:  Diagnosis Date  . Allergy   . GERD (gastroesophageal reflux disease)   . Hyperlipidemia   . Hypertension   . Kidney stones   . Low HDL (under 40)   . Mild depression (HCC)   . Otitis media   . Seasonal rhinitis     Past Surgical History:  Procedure Laterality Date  . BACK SURGERY     x3    Prior to Admission medications   Medication Sig Start Date End Date Taking? Authorizing Provider  amLODipine (NORVASC) 10 MG tablet Take 1 tablet (10 mg total) by mouth daily. 11/11/19  Yes Lynnda Child, MD  Clotrimazole 1 % OINT Apply to effected area on foot until skin is clear. 03/29/20  Yes Lynnda Child, MD  cyclobenzaprine (FLEXERIL) 10 MG tablet Take by mouth as needed. 04/04/20  Yes [provider]  diphenhydrAMINE (BENADRYL) 25 MG tablet Take 25 mg by mouth as directed. Takes with hydrocodone to prevent itching   Yes [provider]  docusate sodium (STOOL SOFTENER LAXATIVE) 250 MG capsule Take 250 mg by mouth daily.   Yes [provider]  fexofenadine (ALLEGRA) 180 MG tablet Take 1 tablet (180 mg total) by mouth daily. 03/29/20  Yes Lynnda Child, MD  Fiber POWD Take by mouth.   Yes [provider]  HYDROcodone-acetaminophen (NORCO/VICODIN) 5-325 MG tablet Take 1 tablet by mouth every 6 (six) hours as needed for moderate pain.   Yes [provider]  Hyoscyamine Sulfate (HYOSCYAMINE PO) Take by mouth. 0.125mg    Yes [provider]  ipratropium (ATROVENT) 0.06 % nasal spray Place 2 sprays into both nostrils 3 (three) times daily. 04/28/19  Yes Jamelle Haring, MD  montelukast  (SINGULAIR) 10 MG tablet TAKE 1 TABLET BY MOUTH EVERY MORNING FORALLERGIES 01/05/20  Yes Lynnda Child, MD  Multiple Vitamin (MULTIVITAMIN) tablet Take 1 tablet by mouth daily.   Yes [provider]  pantoprazole (PROTONIX) 40 MG tablet Take 1 tablet (40 mg total) by mouth daily. 01/17/20  Yes Lynnda Child, MD  Polyethylene Glycol 3350 (MIRALAX PO) Take by mouth.   Yes [provider]    Allergies  Allergen Reactions  . Tessalon Perles [Benzonatate]     itching  . Hydrocodone Itching     Social History   Socioeconomic History  . Marital status: Divorced    Spouse name: Not on file  .  Number of children: 1  . Years of education: [employer]  . Highest education level: Not on file  Occupational History  . Not on file  Tobacco Use  . Smoking status: Never Smoker  . Smokeless tobacco: Former Clinical biochemistUser  Vaping Use  . Vaping Use: Never used  Substance and Sexual Activity  . Alcohol use: Yes    Comment: on the weekends maybe  . Drug use: Never  . Sexual activity: Yes    Birth control/protection: Condom  Other Topics Concern  . 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 Difficulty of Paying Living Expenses:   Food Insecurity:   . Worried About Programme researcher, broadcasting/film/videounning Out of Food in the Last Year:   . Baristaan Out of Food in the Last Year:   Transportation Needs:   . Freight forwarderLack of Transportation (Medical):   Marland Kitchen. Lack of Transportation (Non-Medical):   Physical Activity:   . Days of  Exercise per Week:   . Minutes of Exercise per Session:   Stress:   . Feeling of Stress :   Social Connections:   . Frequency of Communication with Friends and Family:   . Frequency of Social Gatherings with Friends and Family:   . Attends Religious Services:   . Active Member of Clubs or Organizations:   . Attends BankerClub or Organization Meetings:   Marland Kitchen. Marital Status:   Intimate Partner Violence:   . Fear of Current or Ex-Partner:   . Emotionally Abused:   Marland Kitchen. Physically Abused:   . Sexually Abused:     Family History  Problem Relation Age of Onset  . Diabetes Mother   . Anxiety disorder Mother   . Hypertension Mother   . Diabetes Father   . Hypertension Father   . Obesity Sister   . Hypertension Sister     Review of Systems  Constitutional: Negative for chills and fever.  HENT: Negative for congestion and sore throat.   Eyes: Negative for blurred vision and double vision.  Respiratory: Negative for shortness of breath.   Cardiovascular: Negative for chest pain.  Gastrointestinal: Positive for abdominal pain (side pain). Negative for heartburn, nausea and vomiting.  Genitourinary: Negative.   Musculoskeletal: Positive for joint pain. Negative for myalgias.  Skin: Negative for rash.  Neurological: Negative for dizziness and headaches.  Endo/Heme/Allergies: Does not bruise/bleed easily.  Psychiatric/Behavioral: Negative for depression. The patient is not nervous/anxious.      Physical Exam BP 128/86   Pulse 81   Temp 97.9 F (36.6 C) (Temporal)   Ht 5\' 10"  (1.778 m)   Wt 223 lb 8 oz (101.4 kg)   SpO2 97%   BMI 32.07 kg/m    BP Readings from Last 3 Encounters:  04/27/20 128/86  03/29/20 118/80  12/29/19 126/89      Physical Exam Constitutional:  General: He is not in acute distress.    Appearance: He is well-developed. He is not diaphoretic.  HENT:     Head: Normocephalic and atraumatic.     Right Ear: Tympanic membrane and ear canal normal.     Left  Ear: Tympanic membrane and ear canal normal.     Nose: Nose normal.     Mouth/Throat:     Pharynx: Uvula midline.  Eyes:     General: No scleral icterus.    Conjunctiva/sclera: Conjunctivae normal.     Pupils: Pupils are equal, round, and reactive to light.  Cardiovascular:     Rate and Rhythm: Normal rate and regular rhythm.     Heart sounds: Normal heart sounds. No murmur heard.   Pulmonary:     Effort: Pulmonary effort is normal. No respiratory distress.     Breath sounds: Normal breath sounds. No wheezing.  Abdominal:     General: Bowel sounds are normal. There is no distension.     Palpations: Abdomen is soft. There is no mass.     Tenderness: There is no abdominal tenderness. There is no guarding.  Musculoskeletal:        General: Normal range of motion.     Cervical back: Normal range of motion and neck supple.  Lymphadenopathy:     Cervical: No cervical adenopathy.  Skin:    General: Skin is warm and dry.     Capillary Refill: Capillary refill takes less than 2 seconds.  Neurological:     Mental Status: He is alert and oriented to person, place, and time.        Results:  PHQ-9:    Office Visit from 04/27/2020 in [facility] at [facility]  PHQ-9 Total Score 2     Overall good   Assessment: 49 y.o. here for routine annual physical examination.  Plan: Problem List Items Addressed This Visit      Cardiovascular and Mediastinum   Essential hypertension   Relevant Orders   Comprehensive metabolic panel (Completed)    Other Visit Diagnoses    Annual physical exam    -  Primary   Relevant Orders   Comprehensive metabolic panel (Completed)   Lipid panel (Completed)   Hemoglobin A1c (Completed)   Screening for HIV (human immunodeficiency virus)       Relevant Orders   HIV Antibody (routine testing w rflx)   Encounter for hepatitis C screening test for low risk patient       Relevant Orders   Hepatitis C antibody   Routine screening for STI  (sexually transmitted infection)       Relevant Orders   C. trachomatis/N. gonorrhoeae RNA   RPR   Mixed hyperlipidemia       Relevant Orders   Lipid panel (Completed)   Need for Tdap vaccination       Relevant Orders   Tdap vaccine greater than or equal to 7yo IM (Completed)      Screening: -- Blood pressure screen normal -- cholesterol screening: will obtain -- Weight screening: overweight: continue to monitor -- Diabetes Screening: will obtain -- Nutrition: normal - Encouraged healthy diet  The 10-year ASCVD risk score Denman George DC Jr., et al., 2013) is: 4.3%   Values used to calculate the score:     Age: 59 years     Sex: Male     Is Non-Hispanic African American: No     Diabetic: No     Tobacco smoker: No  Systolic Blood Pressure: 128 mmHg     Is BP treated: Yes     HDL Cholesterol: 36.3 mg/dL     Total Cholesterol: 187 mg/dL  -- ASA 81 mg discussed if CVD risk >10% age 66-59 and willing to take for 10 years -- Statin therapy for Age 80-75 with CVD risk >7.5%  Psych -- Depression screening (PHQ-9): negative  Safety -- tobacco screening: not using -- alcohol screening:  low-risk usage. -- no evidence of domestic violence or intimate partner violence.  Cancer Screening -- Prostate (age 25-69) follows with urology -- Colon (age 77-75) up to date -- Lung not indicated   Immunizations Immunization History  Administered Date(s) Administered  . Influenza,inj,Quad PF,6+ Mos 07/22/2019  . PFIZER SARS-COV-2 Vaccination 11/11/2019, 12/02/2019  . Tdap 04/27/2020    -- flu vaccine up to date -- TDAP q10 years do to today -- Covid-19 Vaccine up to date  Encouraged regular vision and dental screening. Encouraged healthy exercise and diet.   Lynnda Child

## 2020-04-28 LAB — HEPATITIS C ANTIBODY
Hepatitis C Ab: NONREACTIVE
SIGNAL TO CUT-OFF: 0.01 (ref <1.00)

## 2020-04-28 LAB — C. TRACHOMATIS/N. GONORRHOEAE RNA
C. trachomatis RNA, TMA: NOT DETECTED
N. gonorrhoeae RNA, TMA: NOT DETECTED

## 2020-04-28 LAB — RPR: RPR Ser Ql: NONREACTIVE

## 2020-04-28 LAB — HIV ANTIBODY (ROUTINE TESTING W REFLEX): HIV 1&2 Ab, 4th Generation: NONREACTIVE

## 2020-05-01 DIAGNOSIS — M722 Plantar fascial fibromatosis: Secondary | ICD-10-CM | POA: Diagnosis not present

## 2020-05-12 ENCOUNTER — Other Ambulatory Visit: Payer: Self-pay | Admitting: Surgery

## 2020-05-12 DIAGNOSIS — K811 Chronic cholecystitis: Secondary | ICD-10-CM | POA: Diagnosis not present

## 2020-05-15 ENCOUNTER — Other Ambulatory Visit: Payer: Self-pay

## 2020-05-15 ENCOUNTER — Ambulatory Visit (INDEPENDENT_AMBULATORY_CARE_PROVIDER_SITE_OTHER): Payer: 59

## 2020-05-15 ENCOUNTER — Encounter: Payer: Self-pay | Admitting: Podiatry

## 2020-05-15 ENCOUNTER — Telehealth: Payer: Self-pay

## 2020-05-15 ENCOUNTER — Ambulatory Visit (INDEPENDENT_AMBULATORY_CARE_PROVIDER_SITE_OTHER): Payer: 59 | Admitting: Podiatry

## 2020-05-15 ENCOUNTER — Other Ambulatory Visit: Payer: Self-pay | Admitting: Podiatry

## 2020-05-15 DIAGNOSIS — M216X2 Other acquired deformities of left foot: Secondary | ICD-10-CM

## 2020-05-15 DIAGNOSIS — M722 Plantar fascial fibromatosis: Secondary | ICD-10-CM

## 2020-05-15 DIAGNOSIS — R52 Pain, unspecified: Secondary | ICD-10-CM

## 2020-05-15 NOTE — Progress Notes (Signed)
le

## 2020-05-15 NOTE — Progress Notes (Signed)
Subjective:   Patient ID: Walter Edwards, male   DOB: 49 y.o.   MRN: [id number]   HPI Patient presents stating that his heel is still really bothering me and I simply cannot be active like I want.  States he likes to be active and hike but has not been able to for at least 6 months and its been gradually becoming more of an issue for him and he is due for gallbladder surgery will be out and would like to get this addressed at the same time due to the longstanding nature of discomfort and failure to respond for so long   ROS      Objective:  Physical Exam  Neurovascular status intact with exquisite discomfort plantar aspect left heel with significant equinus condition noted     Assessment:  Acute plantar fasciitis left with inflammation along with equinus     Plan:  H&P reviewed condition and discussed treatment options.  Due to the longstanding nature I recommended endoscopic release of the fascia along with gastroc lengthening.  I spent a great deal of time going over surgery all complications as outlined and patient is willing to accept risk wants procedure signed consent form after extensive review and is scheduled for outpatient surgery.  I did dispense air fracture walker for the postoperative.   explained total recovery can take 6 months to year 1 year with no long-term guarantees

## 2020-05-15 NOTE — Telephone Encounter (Signed)
DOS 05/23/20  EPF LT - 97847 GASTROCNEMIUS RECESS LT - 84128  AETNA  EFFECTIVE DATE - 04/21/2019  PLAN DEDUCTIBLE - $1000.00 W/ $573.07 REMAINING OUT OF POCKET - $4000.00 W/ $3559.52 REMAINING COPAY $0.00 COINSURANCE - 80%  NO PRECERT REQUIRED FOR CPT 20813 & [phone]. CALL REF# - [id number]

## 2020-05-19 ENCOUNTER — Ambulatory Visit: Payer: 59

## 2020-05-19 ENCOUNTER — Other Ambulatory Visit: Payer: Self-pay

## 2020-05-19 VITALS — BP 124/90 | HR 73 | Temp 98.6°F | Resp 14 | Ht 70.0 in | Wt 224.0 lb

## 2020-05-19 DIAGNOSIS — Z0000 Encounter for general adult medical examination without abnormal findings: Secondary | ICD-10-CM

## 2020-05-19 DIAGNOSIS — M79676 Pain in unspecified toe(s): Secondary | ICD-10-CM

## 2020-05-19 LAB — POCT URINALYSIS DIPSTICK
Bilirubin, UA: NEGATIVE
Blood, UA: NEGATIVE
Glucose, UA: NEGATIVE
Ketones, UA: NEGATIVE
Leukocytes, UA: NEGATIVE
Nitrite, UA: NEGATIVE
Protein, UA: NEGATIVE
Spec Grav, UA: 1.02 (ref 1.010–1.025)
Urobilinogen, UA: 0.2 E.U./dL
pH, UA: 6 (ref 5.0–8.0)

## 2020-05-19 NOTE — Progress Notes (Signed)
Scheduled to complete physical 05/30/20 with Dr. Mayford Knife.  AMD

## 2020-05-22 DIAGNOSIS — M79676 Pain in unspecified toe(s): Secondary | ICD-10-CM

## 2020-05-23 ENCOUNTER — Encounter: Payer: Self-pay | Admitting: Podiatry

## 2020-05-23 DIAGNOSIS — M216X1 Other acquired deformities of right foot: Secondary | ICD-10-CM | POA: Diagnosis not present

## 2020-05-23 DIAGNOSIS — M722 Plantar fascial fibromatosis: Secondary | ICD-10-CM | POA: Diagnosis not present

## 2020-05-23 DIAGNOSIS — M216X2 Other acquired deformities of left foot: Secondary | ICD-10-CM | POA: Diagnosis not present

## 2020-05-23 DIAGNOSIS — I10 Essential (primary) hypertension: Secondary | ICD-10-CM | POA: Diagnosis not present

## 2020-05-23 DIAGNOSIS — M62462 Contracture of muscle, left lower leg: Secondary | ICD-10-CM | POA: Diagnosis not present

## 2020-05-23 HISTORY — PX: ENDOSCOPIC PLANTAR FASCIOTOMY: SUR443

## 2020-05-23 LAB — CMP12+LP+TP+TSH+6AC+PSA+CBC…
ALT: 24 IU/L (ref 0–44)
AST: 18 IU/L (ref 0–40)
Albumin/Globulin Ratio: 1.8 (ref 1.2–2.2)
Albumin: 4.2 g/dL (ref 4.0–5.0)
Alkaline Phosphatase: 106 IU/L (ref 48–121)
BUN/Creatinine Ratio: 11 (ref 9–20)
BUN: 11 mg/dL (ref 6–24)
Basophils Absolute: 0 10*3/uL (ref 0.0–0.2)
Basos: 1 %
Bilirubin Total: 0.3 mg/dL (ref 0.0–1.2)
Calcium: 8.9 mg/dL (ref 8.7–10.2)
Chloride: 104 mmol/L (ref 96–106)
Chol/HDL Ratio: 4.6 ratio (ref 0.0–5.0)
Cholesterol, Total: 181 mg/dL (ref 100–199)
Creatinine, Ser: 1.01 mg/dL (ref 0.76–1.27)
EOS (ABSOLUTE): 0.2 10*3/uL (ref 0.0–0.4)
Eos: 3 %
Estimated CHD Risk: 0.9 times avg. (ref 0.0–1.0)
Free Thyroxine Index: 1.7 (ref 1.2–4.9)
GFR calc Af Amer: 101 mL/min/{1.73_m2} (ref >59)
GFR calc non Af Amer: 88 mL/min/{1.73_m2} (ref >59)
GGT: 18 IU/L (ref 0–65)
Globulin, Total: 2.4 g/dL (ref 1.5–4.5)
Glucose: 103 mg/dL — ABNORMAL HIGH (ref 65–99)
HDL: 39 mg/dL — ABNORMAL LOW (ref >39)
Hematocrit: 44.3 % (ref 37.5–51.0)
Hemoglobin: 15.5 g/dL (ref 13.0–17.7)
Immature Grans (Abs): 0.1 10*3/uL (ref 0.0–0.1)
Immature Granulocytes: 2 %
Iron: 66 ug/dL (ref 38–169)
LDH: 187 IU/L (ref 121–224)
LDL Chol Calc (NIH): 127 mg/dL — ABNORMAL HIGH (ref 0–99)
Lymphocytes Absolute: 1.1 10*3/uL (ref 0.7–3.1)
Lymphs: 20 %
MCH: 30.8 pg (ref 26.6–33.0)
MCHC: 35 g/dL (ref 31.5–35.7)
MCV: 88 fL (ref 79–97)
Monocytes Absolute: 0.6 10*3/uL (ref 0.1–0.9)
Monocytes: 12 %
Neutrophils Absolute: 3.4 10*3/uL (ref 1.4–7.0)
Neutrophils: 62 %
Phosphorus: 3.2 mg/dL (ref 2.8–4.1)
Platelets: 264 10*3/uL (ref 150–450)
Potassium: 4.8 mmol/L (ref 3.5–5.2)
Prostate Specific Ag, Serum: 6.4 ng/mL — ABNORMAL HIGH (ref 0.0–4.0)
RBC: 5.03 x10E6/uL (ref 4.14–5.80)
RDW: 12.4 % (ref 11.6–15.4)
Sodium: 140 mmol/L (ref 134–144)
T3 Uptake Ratio: 25 % (ref 24–39)
T4, Total: 6.6 ug/dL (ref 4.5–12.0)
TSH: 1.64 u[IU]/mL (ref 0.450–4.500)
Total Protein: 6.6 g/dL (ref 6.0–8.5)
Triglycerides: 79 mg/dL (ref 0–149)
Uric Acid: 4.7 mg/dL (ref 3.8–8.4)
VLDL Cholesterol Cal: 15 mg/dL (ref 5–40)
WBC: 5.3 10*3/uL (ref 3.4–10.8)

## 2020-05-23 LAB — QUANTIFERON-TB GOLD PLUS
QuantiFERON Mitogen Value: >10 IU/mL
QuantiFERON Nil Value: 0.18 IU/mL
QuantiFERON TB1 Ag Value: 0.19 IU/mL
QuantiFERON TB2 Ag Value: 0.19 IU/mL
QuantiFERON-TB Gold Plus: NEGATIVE

## 2020-05-23 MED ORDER — ONDANSETRON HCL 4 MG PO TABS: 4.0000 mg | ORAL_TABLET | Freq: Three times a day (TID) | ORAL | 0 refills | Status: DC | PRN

## 2020-05-23 MED ORDER — OXYCODONE-ACETAMINOPHEN 10-325 MG PO TABS: 1.0000 | ORAL_TABLET | ORAL | 0 refills | Status: DC | PRN

## 2020-05-23 NOTE — Addendum Note (Signed)
Addended by: Lenn Sink on: 05/23/2020 01:02 PM   Modules accepted: Orders

## 2020-05-25 ENCOUNTER — Telehealth: Payer: Self-pay | Admitting: *Deleted

## 2020-05-25 NOTE — Telephone Encounter (Signed)
Pt called to find out how much he could be up on his surgery, 05/23/2020 DOS.

## 2020-05-25 NOTE — Telephone Encounter (Signed)
I informed pt that he could have the foot below his heart or weight bear in the boot for 15 minute/hour more would increase the chance of swelling, pain and inflammation, and he should walk in the boot, and sleep in the boot. Pt states understanding.

## 2020-05-30 ENCOUNTER — Encounter (HOSPITAL_COMMUNITY): Payer: Self-pay

## 2020-05-30 ENCOUNTER — Other Ambulatory Visit (HOSPITAL_COMMUNITY)
Admission: RE | Admit: 2020-05-30 | Discharge: 2020-05-30 | Disposition: A | Discharge status: Home / Self Care | Payer: 59 | Source: Ambulatory Visit | Attending: Surgery | Admitting: Surgery

## 2020-05-30 ENCOUNTER — Encounter (HOSPITAL_COMMUNITY)
Admission: RE | Admit: 2020-05-30 | Discharge: 2020-05-30 | Disposition: A | Discharge status: Home / Self Care | Payer: 59 | Source: Ambulatory Visit | Attending: Surgery | Admitting: Surgery

## 2020-05-30 ENCOUNTER — Other Ambulatory Visit: Payer: Self-pay

## 2020-05-30 DIAGNOSIS — Z20822 Contact with and (suspected) exposure to covid-19: Secondary | ICD-10-CM | POA: Insufficient documentation

## 2020-05-30 DIAGNOSIS — Z01812 Encounter for preprocedural laboratory examination: Secondary | ICD-10-CM | POA: Insufficient documentation

## 2020-05-30 DIAGNOSIS — K811 Chronic cholecystitis: Secondary | ICD-10-CM | POA: Insufficient documentation

## 2020-05-30 DIAGNOSIS — E786 Lipoprotein deficiency: Secondary | ICD-10-CM | POA: Insufficient documentation

## 2020-05-30 DIAGNOSIS — Z7901 Long term (current) use of anticoagulants: Secondary | ICD-10-CM | POA: Insufficient documentation

## 2020-05-30 DIAGNOSIS — Z79899 Other long term (current) drug therapy: Secondary | ICD-10-CM | POA: Insufficient documentation

## 2020-05-30 DIAGNOSIS — Z981 Arthrodesis status: Secondary | ICD-10-CM | POA: Diagnosis not present

## 2020-05-30 DIAGNOSIS — K219 Gastro-esophageal reflux disease without esophagitis: Secondary | ICD-10-CM | POA: Insufficient documentation

## 2020-05-30 DIAGNOSIS — E785 Hyperlipidemia, unspecified: Secondary | ICD-10-CM | POA: Insufficient documentation

## 2020-05-30 DIAGNOSIS — I10 Essential (primary) hypertension: Secondary | ICD-10-CM | POA: Insufficient documentation

## 2020-05-30 LAB — BASIC METABOLIC PANEL
Anion gap: 10 (ref 5–15)
BUN: 16 mg/dL (ref 6–20)
CO2: 24 mmol/L (ref 22–32)
Calcium: 9.1 mg/dL (ref 8.9–10.3)
Chloride: 104 mmol/L (ref 98–111)
Creatinine, Ser: 0.92 mg/dL (ref 0.61–1.24)
GFR calc Af Amer: >60 mL/min (ref >60)
GFR calc non Af Amer: >60 mL/min (ref >60)
Glucose, Bld: 113 mg/dL — ABNORMAL HIGH (ref 70–99)
Potassium: 4.1 mmol/L (ref 3.5–5.1)
Sodium: 138 mmol/L (ref 135–145)

## 2020-05-30 LAB — CBC
HCT: 48.6 % (ref 39.0–52.0)
Hemoglobin: 16.5 g/dL (ref 13.0–17.0)
MCH: 30.8 pg (ref 26.0–34.0)
MCHC: 34 g/dL (ref 30.0–36.0)
MCV: 90.7 fL (ref 80.0–100.0)
Platelets: 309 10*3/uL (ref 150–400)
RBC: 5.36 MIL/uL (ref 4.22–5.81)
RDW: 12.4 % (ref 11.5–15.5)
WBC: 10.7 10*3/uL — ABNORMAL HIGH (ref 4.0–10.5)
nRBC: 0 % (ref 0.0–0.2)

## 2020-05-30 LAB — SARS CORONAVIRUS 2 (TAT 6-24 HRS): SARS Coronavirus 2: NEGATIVE

## 2020-05-30 NOTE — Progress Notes (Signed)
PCP - Dr. Gweneth Dimitri Cardiologist - Dr. Lennie Odor (per patient seen once, PCP referral due to HTN, no follow-up needed, PCP manages HTN")  PPM/ICD - denies  Chest x-ray - N/A EKG - 05/19/2020 Stress Test - denies ECHO - denies Cardiac Cath - denies  Sleep Study - denies CPAP - N/A  DM: denies  Blood Thinner Instructions: N/A Aspirin Instructions: N/A  ERAS Protcol - Yes PRE-SURGERY Ensure or G2- Ensure given  COVID TEST- 05/30/2020, test results pending. Patient verbalized understanding of self-quarantine instructions.  Anesthesia review: YES, follow-up on question about NPO status prior to surgery. Surgeon's office contacted. Awaiting answer. Revonda Standard, [place] aware contact info given  Patient denies shortness of breath, fever, cough and chest pain at PAT appointment  All instructions explained to the patient, with a verbal understanding of the material. Patient agrees to go over the instructions while at home for a better understanding. Patient also instructed to self quarantine after being tested for COVID-19. The opportunity to ask questions was provided.

## 2020-05-30 NOTE — Progress Notes (Signed)
Anesthesia Chart Review:  Case: [id number] Date/Time: 06/02/20 1545   Procedure: LAPAROSCOPIC CHOLECYSTECTOMY (N/A )   Anesthesia type: General   Pre-op diagnosis: CHRONIC CHOLECYSTITIS   Location: [facility] ROOM 02 / [facility] OR   Surgeons: [name], MD      DISCUSSION: Patient is a 49 year old male scheduled for the above procedure.   History includes never smoker, GERD, HTN, HLD, back surgery. He is s/p endoscopic release of the left fascia along with gastrocnemius lengthening on 05/23/20. S/p right L5-S1 microdiskectomy using microdissection 12/23/01 with redo 11/21/04. S/p Redo L5-S1 microdiskectomy; L5-S1 transforaminal lumbar interbody fusion; insertion of L5-S1 interbody prosthesis (Capstone PEEK cage), posterior nonsegmental amputation L5-S1 with legacy titanium pedicle screws and rods; L5-S1 posterolateral arthrodesis with localized morselized autograft bone and VITOSS bone graft extender 12/31/06.   Surgery is currently scheduled for 4:00 PM on 06/02/20. Patient reports issues with headaches with long periods without eating. He is concerned that just clear liquids after MN that he will develop a bad headache before surgery. Staff reached out to Dr. Eliberto Ivory office. Dr. Magnus Ivan is okay with patient having a light breakfast 8 hours prior to surgery if okay with anesthesia. He denied any known issues with gastroparesis. From an anesthesia standpoint, NPO for 8 hours prior to surgery (with clear liquids until 1:00 PM based on current instructions/time of surgery) should be okay. I have notified patient.   Preoperative COVID-19 test is in process. Anesthesia team to evaluate on the day of surgery.    VS: BP 140/87   Pulse 95   Temp 37.1 C (Oral)   Resp 18   Ht 5\' 10"  (1.778 m)   Wt 103.1 kg   SpO2 97%   BMI 32.60 kg/m    PROVIDERS: , MD is PCP  Lynnda Child, MD is cardiologist. Last visit was 11/29/19 for atypical chest pain in the setting of newly diagnosed HTN, now  treated. Symptoms resolved. Was having some epigastric symptoms felt more GI related and had GI referral in process at that time. Given atypical symptoms, no ischemic testing recommended with as needed follow-up.   LABS: Labs reviewed: Acceptable for surgery. A1c 5.4% on 04/27/20.  (all labs ordered are listed, but only abnormal results are displayed)  Labs Reviewed  BASIC METABOLIC PANEL - Abnormal; Notable for the following components:      Result Value   Glucose, Bld 113 (*)    All other components within normal limits  CBC - Abnormal; Notable for the following components:   WBC 10.7 (*)    All other components within normal limits     IMAGES: CT abd/pelvis 01/04/20: IMPRESSION: 1. No acute abnormalities in the abdomen or pelvis. 2. Hepatic hypoattenuation with sparing along the gallbladder fossa most commonly seen with hepatic steatosis. 3. Evidence of prior L5-S1 posterior spinal fusion and right L5 hemilaminectomy with interbody spacer placement. No evidence of hardware failure or complication.  CXR 08/13/19: FINDINGS: Lungs are clear. Heart size and pulmonary vascularity are normal. No adenopathy. No pneumothorax. No bone lesions. IMPRESSION: No edema or consolidation.   EKG: 05/19/20: Sinus  Rhythm  WITHIN NORMAL LIMITS   CV: Denied prior stress test, cardiac cath, echocardiogram   Past Medical History:  Diagnosis Date  . Allergy   . GERD (gastroesophageal reflux disease)   . Hyperlipidemia   . Hypertension   . Kidney stones   . Low HDL (under 40)   . Mild depression (HCC)   . Otitis media   .  Seasonal rhinitis     Past Surgical History:  Procedure Laterality Date  . BACK SURGERY     x3  . CHOLECYSTECTOMY  06/02/2020  . COLONOSCOPY    . ENDOSCOPIC PLANTAR FASCIOTOMY  05/23/2020    MEDICATIONS: . amLODipine (NORVASC) 10 MG tablet  . cyclobenzaprine (FLEXERIL) 10 MG tablet  . diphenhydrAMINE (BENADRYL) 25 MG tablet  . docusate sodium (STOOL  SOFTENER LAXATIVE) 250 MG capsule  . fexofenadine (ALLEGRA) 180 MG tablet  . HYDROcodone-acetaminophen (NORCO/VICODIN) 5-325 MG tablet  . ipratropium (ATROVENT) 0.06 % nasal spray  . montelukast (SINGULAIR) 10 MG tablet  . Multiple Vitamin (MULTIVITAMIN) tablet  . ondansetron (ZOFRAN) 4 MG tablet  . oxyCODONE-acetaminophen (PERCOCET) 10-325 MG tablet  . pantoprazole (PROTONIX) 40 MG tablet  . polyethylene glycol powder (MIRALAX) 17 GM/SCOOP powder  . psyllium (METAMUCIL) 58.6 % powder   No current facility-administered medications for this encounter.     Shonna Chock, PA-C Surgical Short Stay/Anesthesiology [facility] Phone [phone] [facility] Phone [phone] 05/30/2020 5:23 PM

## 2020-05-30 NOTE — Anesthesia Preprocedure Evaluation (Addendum)
Anesthesia Evaluation  Patient identified by MRN, date of birth, ID band Patient awake    Reviewed: Allergy & Precautions, NPO status , Patient's Chart, lab work & pertinent test results  Airway Mallampati: II  TM Distance: >3 FB Neck ROM: Full    Dental  (+) Teeth Intact, Dental Advisory Given   Pulmonary    Pulmonary exam normal breath sounds clear to auscultation       Cardiovascular hypertension, Pt. on medications Normal cardiovascular exam Rhythm:Regular Rate:Normal     Neuro/Psych PSYCHIATRIC DISORDERS Depression negative neurological ROS     GI/Hepatic Neg liver ROS, GERD  Medicated and Controlled,  Endo/Other  negative endocrine ROS  Renal/GU negative Renal ROS  negative genitourinary   Musculoskeletal negative musculoskeletal ROS (+)   Abdominal (+) + obese,   Peds  Hematology negative hematology ROS (+)   Anesthesia Other Findings   Reproductive/Obstetrics negative OB ROS                            Anesthesia Physical Anesthesia Plan  ASA: II  Anesthesia Plan: General   Post-op Pain Management:    Induction: Intravenous  PONV Risk Score and Plan: 3 and Ondansetron, Dexamethasone, Midazolam and Treatment may vary due to age or medical condition  Airway Management Planned: Oral ETT  Additional Equipment: None  Intra-op Plan:   Post-operative Plan: Extubation in OR  Informed Consent: I have reviewed the patients History and Physical, chart, labs and discussed the procedure including the risks, benefits and alternatives for the proposed anesthesia with the patient or authorized representative who has indicated his/her understanding and acceptance.     Dental advisory given  Plan Discussed with: CRNA  Anesthesia Plan Comments:        Anesthesia Quick Evaluation

## 2020-05-30 NOTE — Progress Notes (Signed)
Your procedure is scheduled on Friday, August 13th.  Report to [facility] Main Entrance "A" at 2:00 P.M., and check in at the Admitting office.  Call this number if you have problems the morning of surgery:  [phone]  Call [phone] if you have any questions prior to your surgery date Monday-Friday 8am-4pm   Remember:  Do not eat after midnight the night before your surgery  You may drink clear liquids until 1:00 P.M the day of your surgery.   Clear liquids allowed are: Water, Non-Citrus Juices (without pulp), Carbonated Beverages, Clear Tea, Black Coffee Only, and Gatorade   Please complete your PRE-SURGERY ENSURE that was provided to you by 1:00 P.M. the afternoon of surgery.  Please, if able, drink it in one setting. DO NOT SIP.   Take these medicines the morning of surgery with A SIP OF WATER  amLODipine (NORVASC)  ipratropium (ATROVENT)/nasal spray  pantoprazole (PROTONIX)   If needed - cyclobenzaprine (FLEXERIL), diphenhydrAMINE (BENADRYL), fexofenadine (ALLEGRA), HYDROcodone-acetaminophen (NORCO/VICODIN), ondansetron (ZOFRAN), oxyCODONE-acetaminophen (PERCOCET)      As of today, STOP taking any Aspirin (unless otherwise instructed by your surgeon) Aleve, Naproxen, Ibuprofen, Motrin, Advil, Goody's, BC's, all herbal medications, fish oil, and all vitamins.                     Do not wear jewelry.            Do not wear lotions, powders, colognes, or deodorant.            Men may shave face and neck.            Do not bring valuables to the hospital.            [facility] is not responsible for any belongings or valuables.  Do NOT Smoke (Tobacco/Vaping) or drink Alcohol 24 hours prior to your procedure If you use a CPAP at night, you may bring all equipment for your overnight stay.   Contacts, glasses, dentures or bridgework may not be worn into surgery.      For patients admitted to the hospital, discharge time will be determined by your treatment team.   Patients  discharged the day of surgery will not be allowed to drive home, and someone needs to stay with them for 24 hours.  Special instructions:   [facility]- Preparing For Surgery  Before surgery, you can play an important role. Because skin is not sterile, your skin needs to be as free of germs as possible. You can reduce the number of germs on your skin by washing with CHG (chlorahexidine gluconate) Soap before surgery.  CHG is an antiseptic cleaner which kills germs and bonds with the skin to continue killing germs even after washing.    Oral Hygiene is also important to reduce your risk of infection.  Remember - BRUSH YOUR TEETH THE MORNING OF SURGERY WITH YOUR REGULAR TOOTHPASTE  Please do not use if you have an allergy to CHG or antibacterial soaps. If your skin becomes reddened/irritated stop using the CHG.  Do not shave (including legs and underarms) for at least 48 hours prior to first CHG shower. It is OK to shave your face.  Please follow these instructions carefully.   1. Shower the NIGHT BEFORE SURGERY and the MORNING OF SURGERY with CHG Soap.   2. If you chose to wash your hair, wash your hair first as usual with your normal shampoo.  3. After you shampoo, rinse your hair and body  thoroughly to remove the shampoo.  4. Use CHG as you would any other liquid soap. You can apply CHG directly to the skin and wash gently with a scrungie or a clean washcloth.   5. Apply the CHG Soap to your body ONLY FROM THE NECK DOWN.  Do not use on open wounds or open sores. Avoid contact with your eyes, ears, mouth and genitals (private parts). Wash Face and genitals (private parts)  with your normal soap.   6. Wash thoroughly, paying special attention to the area where your surgery will be performed.  7. Thoroughly rinse your body with warm water from the neck down.  8. DO NOT shower/wash with your normal soap after using and rinsing off the CHG Soap.  9. Pat yourself dry with a CLEAN  TOWEL.  10. Wear CLEAN PAJAMAS to bed the night before surgery  11. Place CLEAN SHEETS on your bed the night of your first shower and DO NOT SLEEP WITH PETS.  Day of Surgery: Wear Clean/Comfortable clothing the morning of surgery Do not apply any deodorants/lotions.   Remember to brush your teeth WITH YOUR REGULAR TOOTHPASTE.   Please read over the following fact sheets that you were given.

## 2020-05-31 ENCOUNTER — Encounter: Payer: Self-pay | Admitting: Podiatry

## 2020-05-31 ENCOUNTER — Ambulatory Visit (INDEPENDENT_AMBULATORY_CARE_PROVIDER_SITE_OTHER): Payer: 59 | Admitting: Podiatry

## 2020-05-31 VITALS — Temp 98.7°F

## 2020-05-31 DIAGNOSIS — M216X2 Other acquired deformities of left foot: Secondary | ICD-10-CM

## 2020-05-31 DIAGNOSIS — M722 Plantar fascial fibromatosis: Secondary | ICD-10-CM

## 2020-05-31 NOTE — Progress Notes (Signed)
Subjective:   Patient ID: Walter Edwards, male   DOB: 49 y.o.   MRN: [id number]   HPI Patient presents stating I am doing really well with surgery and very pleased and having my gallbladder out on Friday   ROS      Objective:  Physical Exam  Neurovascular status intact negative [name]' sign noted wound edges healed well stitches intact      Assessment:  Doing well post gastroc relief endoscopic heel surgery     Plan:  Reviewed condition recommended continued compression elevation and immobilization and reappoint again in 2 weeks suture removal or earlier if needed

## 2020-06-02 ENCOUNTER — Encounter (HOSPITAL_COMMUNITY): Payer: Self-pay | Admitting: Surgery

## 2020-06-02 ENCOUNTER — Encounter (HOSPITAL_COMMUNITY)
Admission: RE | Disposition: A | Discharge status: Home / Self Care | Payer: Self-pay | Source: Home / Self Care | Attending: Surgery

## 2020-06-02 ENCOUNTER — Ambulatory Visit (HOSPITAL_COMMUNITY): Payer: 59

## 2020-06-02 ENCOUNTER — Ambulatory Visit (HOSPITAL_COMMUNITY): Payer: 59 | Admitting: Vascular Surgery

## 2020-06-02 ENCOUNTER — Other Ambulatory Visit: Payer: Self-pay

## 2020-06-02 ENCOUNTER — Ambulatory Visit (HOSPITAL_COMMUNITY)
Admission: RE | Admit: 2020-06-02 | Discharge: 2020-06-02 | Disposition: A | Discharge status: Home / Self Care | Payer: 59 | Attending: Surgery | Admitting: Surgery

## 2020-06-02 DIAGNOSIS — K219 Gastro-esophageal reflux disease without esophagitis: Secondary | ICD-10-CM | POA: Insufficient documentation

## 2020-06-02 DIAGNOSIS — Z79899 Other long term (current) drug therapy: Secondary | ICD-10-CM | POA: Diagnosis not present

## 2020-06-02 DIAGNOSIS — K811 Chronic cholecystitis: Secondary | ICD-10-CM | POA: Insufficient documentation

## 2020-06-02 DIAGNOSIS — I10 Essential (primary) hypertension: Secondary | ICD-10-CM | POA: Insufficient documentation

## 2020-06-02 DIAGNOSIS — E785 Hyperlipidemia, unspecified: Secondary | ICD-10-CM | POA: Diagnosis not present

## 2020-06-02 HISTORY — PX: CHOLECYSTECTOMY: SHX55

## 2020-06-02 SURGERY — LAPAROSCOPIC CHOLECYSTECTOMY
Anesthesia: General | Site: Abdomen

## 2020-06-02 MED ORDER — LIDOCAINE 2% (20 MG/ML) 5 ML SYRINGE
INTRAMUSCULAR | Status: DC | PRN
  Administered 2020-06-02: 80 mg via INTRAVENOUS

## 2020-06-02 MED ORDER — HYDRALAZINE HCL 20 MG/ML IJ SOLN
10.0000 mg | Freq: Once | INTRAMUSCULAR | Status: AC
  Administered 2020-06-02: 10 mg via INTRAVENOUS

## 2020-06-02 MED ORDER — DEXAMETHASONE SODIUM PHOSPHATE 10 MG/ML IJ SOLN
INTRAMUSCULAR | Status: DC | PRN
  Administered 2020-06-02: 5 mg via INTRAVENOUS

## 2020-06-02 MED ORDER — PROMETHAZINE HCL 25 MG/ML IJ SOLN: 6.2500 mg | INTRAMUSCULAR | Status: DC | PRN

## 2020-06-02 MED ORDER — ACETAMINOPHEN 500 MG PO TABS
ORAL_TABLET | ORAL | Status: AC
  Administered 2020-06-02: 1000 mg via ORAL
  Filled 2020-06-02: qty 2

## 2020-06-02 MED ORDER — FENTANYL CITRATE (PF) 100 MCG/2ML IJ SOLN
INTRAMUSCULAR | Status: DC | PRN
  Administered 2020-06-02: 50 ug via INTRAVENOUS
  Administered 2020-06-02: 100 ug via INTRAVENOUS
  Administered 2020-06-02: 50 ug via INTRAVENOUS

## 2020-06-02 MED ORDER — CEFAZOLIN SODIUM-DEXTROSE 2-4 GM/100ML-% IV SOLN
INTRAVENOUS | Status: AC
  Filled 2020-06-02: qty 100

## 2020-06-02 MED ORDER — DEXAMETHASONE SODIUM PHOSPHATE 10 MG/ML IJ SOLN
INTRAMUSCULAR | Status: AC
  Filled 2020-06-02: qty 1

## 2020-06-02 MED ORDER — OXYCODONE HCL 5 MG PO TABS: 5.0000 mg | ORAL_TABLET | Freq: Once | ORAL | Status: DC | PRN

## 2020-06-02 MED ORDER — ONDANSETRON HCL 4 MG/2ML IJ SOLN
INTRAMUSCULAR | Status: AC
  Filled 2020-06-02: qty 2

## 2020-06-02 MED ORDER — CELECOXIB 200 MG PO CAPS
ORAL_CAPSULE | ORAL | Status: AC
  Administered 2020-06-02: 400 mg via ORAL
  Filled 2020-06-02: qty 2

## 2020-06-02 MED ORDER — HYDRALAZINE HCL 20 MG/ML IJ SOLN
INTRAMUSCULAR | Status: AC
  Filled 2020-06-02: qty 1

## 2020-06-02 MED ORDER — ONDANSETRON HCL 4 MG/2ML IJ SOLN
INTRAMUSCULAR | Status: DC | PRN
  Administered 2020-06-02: 4 mg via INTRAVENOUS

## 2020-06-02 MED ORDER — ROCURONIUM BROMIDE 10 MG/ML (PF) SYRINGE
PREFILLED_SYRINGE | INTRAVENOUS | Status: AC
  Filled 2020-06-02: qty 10

## 2020-06-02 MED ORDER — OXYCODONE HCL 5 MG/5ML PO SOLN: 5.0000 mg | Freq: Once | ORAL | Status: DC | PRN

## 2020-06-02 MED ORDER — CHLORHEXIDINE GLUCONATE CLOTH 2 % EX PADS: 6.0000 | MEDICATED_PAD | Freq: Once | CUTANEOUS | Status: DC

## 2020-06-02 MED ORDER — CHLORHEXIDINE GLUCONATE 0.12 % MT SOLN: 15.0000 mL | Freq: Once | OROMUCOSAL | Status: AC

## 2020-06-02 MED ORDER — EPHEDRINE SULFATE 50 MG/ML IJ SOLN
INTRAMUSCULAR | Status: DC | PRN
Start: 2020-06-02 — End: 2020-06-02
  Administered 2020-06-02 (×2): 5 mg via INTRAVENOUS

## 2020-06-02 MED ORDER — PHENYLEPHRINE HCL (PRESSORS) 10 MG/ML IV SOLN
INTRAVENOUS | Status: DC | PRN
  Administered 2020-06-02: 80 ug via INTRAVENOUS
  Administered 2020-06-02: 40 ug via INTRAVENOUS

## 2020-06-02 MED ORDER — BUPIVACAINE HCL (PF) 0.25 % IJ SOLN
INTRAMUSCULAR | Status: DC | PRN
  Administered 2020-06-02: 20 mL

## 2020-06-02 MED ORDER — GABAPENTIN 300 MG PO CAPS: 300.0000 mg | ORAL_CAPSULE | ORAL | Status: AC

## 2020-06-02 MED ORDER — ENSURE PRE-SURGERY PO LIQD: 296.0000 mL | Freq: Once | ORAL | Status: DC

## 2020-06-02 MED ORDER — GABAPENTIN 300 MG PO CAPS
ORAL_CAPSULE | ORAL | Status: AC
  Administered 2020-06-02: 300 mg via ORAL
  Filled 2020-06-02: qty 1

## 2020-06-02 MED ORDER — SODIUM CHLORIDE 0.9 % IR SOLN
Status: DC | PRN
  Administered 2020-06-02: 1000 mL

## 2020-06-02 MED ORDER — ROCURONIUM BROMIDE 10 MG/ML (PF) SYRINGE
PREFILLED_SYRINGE | INTRAVENOUS | Status: DC | PRN
  Administered 2020-06-02: 60 mg via INTRAVENOUS

## 2020-06-02 MED ORDER — PROPOFOL 10 MG/ML IV BOLUS
INTRAVENOUS | Status: DC | PRN
  Administered 2020-06-02: 200 mg via INTRAVENOUS

## 2020-06-02 MED ORDER — MEPERIDINE HCL 25 MG/ML IJ SOLN: 6.2500 mg | INTRAMUSCULAR | Status: DC | PRN

## 2020-06-02 MED ORDER — MIDAZOLAM HCL 2 MG/2ML IJ SOLN
INTRAMUSCULAR | Status: AC
  Filled 2020-06-02: qty 2

## 2020-06-02 MED ORDER — 0.9 % SODIUM CHLORIDE (POUR BTL) OPTIME
TOPICAL | Status: DC | PRN
  Administered 2020-06-02: 1000 mL

## 2020-06-02 MED ORDER — ORAL CARE MOUTH RINSE: 15.0000 mL | Freq: Once | OROMUCOSAL | Status: AC

## 2020-06-02 MED ORDER — FENTANYL CITRATE (PF) 250 MCG/5ML IJ SOLN
INTRAMUSCULAR | Status: AC
  Filled 2020-06-02: qty 5

## 2020-06-02 MED ORDER — MIDAZOLAM HCL 5 MG/5ML IJ SOLN
INTRAMUSCULAR | Status: DC | PRN
  Administered 2020-06-02: 2 mg via INTRAVENOUS

## 2020-06-02 MED ORDER — LIDOCAINE 2% (20 MG/ML) 5 ML SYRINGE
INTRAMUSCULAR | Status: AC
  Filled 2020-06-02: qty 5

## 2020-06-02 MED ORDER — HYDROMORPHONE HCL 1 MG/ML IJ SOLN
INTRAMUSCULAR | Status: AC
  Filled 2020-06-02: qty 1

## 2020-06-02 MED ORDER — CHLORHEXIDINE GLUCONATE 0.12 % MT SOLN
OROMUCOSAL | Status: AC
  Administered 2020-06-02: 15 mL via OROMUCOSAL
  Filled 2020-06-02: qty 15

## 2020-06-02 MED ORDER — PROPOFOL 10 MG/ML IV BOLUS
INTRAVENOUS | Status: AC
  Filled 2020-06-02: qty 20

## 2020-06-02 MED ORDER — KETOROLAC TROMETHAMINE 30 MG/ML IJ SOLN: 30.0000 mg | Freq: Once | INTRAMUSCULAR | Status: DC | PRN

## 2020-06-02 MED ORDER — CELECOXIB 200 MG PO CAPS: 400.0000 mg | ORAL_CAPSULE | ORAL | Status: AC

## 2020-06-02 MED ORDER — SUGAMMADEX SODIUM 200 MG/2ML IV SOLN
INTRAVENOUS | Status: DC | PRN
  Administered 2020-06-02: 400 mg via INTRAVENOUS

## 2020-06-02 MED ORDER — HYDROMORPHONE HCL 1 MG/ML IJ SOLN
0.2500 mg | INTRAMUSCULAR | Status: DC | PRN
  Administered 2020-06-02: 0.5 mg via INTRAVENOUS

## 2020-06-02 MED ORDER — LACTATED RINGERS IV SOLN: INTRAVENOUS | Status: DC

## 2020-06-02 MED ORDER — ACETAMINOPHEN 500 MG PO TABS: 1000.0000 mg | ORAL_TABLET | ORAL | Status: AC

## 2020-06-02 MED ORDER — CEFAZOLIN SODIUM-DEXTROSE 2-4 GM/100ML-% IV SOLN
2.0000 g | INTRAVENOUS | Status: AC
  Administered 2020-06-02: 2 g via INTRAVENOUS

## 2020-06-02 MED ORDER — BUPIVACAINE HCL (PF) 0.25 % IJ SOLN
INTRAMUSCULAR | Status: AC
  Filled 2020-06-02: qty 30

## 2020-06-02 SURGICAL SUPPLY — 36 items

## 2020-06-02 NOTE — Anesthesia Postprocedure Evaluation (Signed)
Anesthesia Post Note  Patient: [name]  Procedure(s) Performed: LAPAROSCOPIC CHOLECYSTECTOMY (N/A Abdomen)     Patient location during evaluation: PACU Anesthesia Type: General Level of consciousness: awake and alert, oriented and patient cooperative Pain management: pain level controlled Vital Signs Assessment: post-procedure vital signs reviewed and stable Respiratory status: spontaneous breathing, nonlabored ventilation and respiratory function stable Cardiovascular status: blood pressure returned to baseline and stable Postop Assessment: no apparent nausea or vomiting Anesthetic complications: no Comments: HTN treated with hydralazine    No complications documented.  Last Vitals:  Vitals:   06/02/20 1604 06/02/20 1619  BP: (!) 139/100 (!) 170/108  Pulse: 72 74  Resp: 16 13  Temp: 36.5 C   SpO2: 99% 99%    Last Pain:  Vitals:   06/02/20 1604  TempSrc:   PainSc: 8                  Lannie Fields

## 2020-06-02 NOTE — Transfer of Care (Signed)
Immediate Anesthesia Transfer of Care Note  Patient: [name]  Procedure(s) Performed: LAPAROSCOPIC CHOLECYSTECTOMY (N/A Abdomen)  Patient Location: PACU  Anesthesia Type:General  Level of Consciousness: drowsy and patient cooperative  Airway & Oxygen Therapy: Patient Spontanous Breathing and Patient connected to nasal cannula oxygen  Post-op Assessment: Report given to RN, Post -op Vital signs reviewed and stable and Patient moving all extremities X 4  Post vital signs: Reviewed and stable  Last Vitals:  Vitals Value Taken Time  BP 160/106 06/02/20 1605  Temp    Pulse 68 06/02/20 1608  Resp 15 06/02/20 1608  SpO2 99 % 06/02/20 1608  Vitals shown include unvalidated device data.  Last Pain:  Vitals:   06/02/20 1337  TempSrc:   PainSc: 2       Patients Stated Pain Goal: 5 (06/02/20 1337)  Complications: No complications documented.

## 2020-06-02 NOTE — H&P (Signed)
Walter Edwards Appointment: 05/12/2020 10:20 AM Location: [facility] Patient #: [id number] DOB: 03-01-1971 Divorced / Language: [place] / Race: White Male   History of Present Illness [name] Ivan MD; 05/12/2020 10:23 AM) The patient is a 49 year old male who presents with abdominal pain.  Chief complaint: Right upper quadrant and epigastric abdominal pain  This is a 49 year old gentleman who presents with a two-year history of the above complaints. This started only intermittently 2 years ago with bloating, right upper quadrant pain, and nausea after fatty meals. It is slowly worsening to where he has symptoms every day. It is worse in the evenings. He has no emesis. Bowel movements are fairly unremarkable. Again, this always occurs after fatty meals. He has a family history of multiple family members needing cholecystectomies. He has had a negative upper and lower endoscopy, a negative CT scan, a negative ultrasound for gallstones, and a HIDA scan showing a 40% gallbladder ejection fraction. He has no complaints. He has no cardiopulmonary issues.   Past Surgical History [name] [place], RMA; 05/12/2020 10:01 AM) Spinal Surgery - Lower Back   Diagnostic Studies History [name] [name], RMA; 05/12/2020 10:01 AM) Colonoscopy  within last year  Allergies [name] Haggett, RMA; 05/12/2020 10:02 AM) HYDROcodone-Acetaminophen *ANALGESICS - OPIOID*  Itching. Allergies Reconciled   Medication History [employer], RMA; 05/12/2020 10:04 AM) [name] Allergy (60MG  Tablet, Oral) Active. amLODIPine Besylate (10MG  Tablet, Oral) Active. Benadryl Allergy (25MG  Tablet, Oral) Active. Claritin (10MG  Capsule, Oral) Active. Cyclobenzaprine HCl (10MG  Tablet, Oral) Active. HYDROcodone-Acetaminophen (5-325MG  Tablet, Oral) Active. Ipratropium Bromide (0.06% Solution, Nasal) Active. Montelukast Sodium (10MG  Tablet, Oral) Active. Pantoprazole Sodium  (40MG  Tablet DR, Oral) Active. Singulair (10MG  Tablet, Oral) Active. Benefiber (Oral) Active. MiraLax (17GM Packet, Oral) Active. Stool Softener Laxative (250MG  Capsule, Oral) Active. Medications Reconciled  Social History [place], RMA; 05/12/2020 10:01 AM) Caffeine use  Carbonated beverages, Coffee.  Family History [place], RMA; 05/12/2020 10:01 AM) Depression  Mother. Diabetes Mellitus  Mother. Hypertension  Father.  Other Problems , RMA; 05/12/2020 10:01 AM) Back Pain  Gastroesophageal Reflux Disease  Hypercholesterolemia   Vitals ([name] RMA; 05/12/2020 10:04 AM) 05/12/2020 10:04 AM Weight: 226.4 lb Height: 70in Body Surface Area: 2.2 m Body Mass Index: 32.48 kg/m  Temp.: 98.79F (Temporal)  Pulse: 93 (Regular)  P.OX: 96% (Room air) BP: 140/92(Sitting, Left Arm, Standard)       Physical Exam (Tyler Cubit A. [place] MD; 05/12/2020 10:24 AM) The physical exam findings are as follows: Note: He appears normal on exam  His abdomen is soft. There is some bloating in the upper abdomen and some tenderness in the right upper quadrant which is mild with minimal guarding  There is no hepatomegaly  There are no hernias    Assessment & Plan (Luxe Cuadros A. [name] MD; 05/12/2020 10:25 AM) CHRONIC CHOLECYSTITIS (K81.1) Impression: I have reviewed all of his studies including a CAT scan, ultrasound, HIDA scan, and endoscopy results. Based on his symptoms, I believe he has biliary colic and chronic cholecystitis. His symptoms are consistent with biliary issues. He is to have a cholecystectomy given the symptoms. We discussed the diagnosis in detail. We also discussed proceeding with a laparoscopic cholecystectomy. I discussed the procedure in detail. The patient was given 05/14/2020. We discussed the risks and benefits of a laparoscopic cholecystectomy and possible cholangiogram including, but not limited  to, bleeding, infection, injury to surrounding structures such as the intestine or liver, bile leak, retained gallstones, need to convert to an  open procedure, prolonged diarrhea, blood clots such as DVT, common bile duct injury, anesthesia risks, and possible need for additional procedures. The likelihood of improvement in symptoms and return to the patient's normal status is good. We discussed the typical post-operative recovery course. All questions were answered.

## 2020-06-02 NOTE — Discharge Instructions (Signed)
[facility] ______[facility] LAPAROSCOPIC SURGERY: POST OP INSTRUCTIONS Always review your discharge instruction sheet given to you by the facility where your surgery was performed. IF YOU HAVE DISABILITY OR FAMILY LEAVE FORMS, YOU MUST BRING THEM TO THE OFFICE FOR PROCESSING.   DO NOT GIVE THEM TO YOUR DOCTOR.  1. A prescription for pain medication may be given to you upon discharge.  Take your pain medication as prescribed, if needed.  If narcotic pain medicine is not needed, then you may take acetaminophen (Tylenol) or ibuprofen (Advil) as needed. 2. Take your usually prescribed medications unless otherwise directed. 3. If you need a refill on your pain medication, please contact your pharmacy.  They will contact our office to request authorization. Prescriptions will not be filled after 5pm or on week-ends. 4. You should follow a light diet the first few days after arrival home, such as soup and crackers, etc.  Be sure to include lots of fluids daily. 5. Most patients will experience some swelling and bruising in the area of the incisions.  Ice packs will help.  Swelling and bruising can take several days to resolve.  6. It is common to experience some constipation if taking pain medication after surgery.  Increasing fluid intake and taking a stool softener (such as Colace) will usually help or prevent this problem from occurring.  A mild laxative (Milk of Magnesia or Miralax) should be taken according to package instructions if there are no bowel movements after 48 hours. 7. Unless discharge instructions indicate otherwise, you may remove your bandages 24-48 hours after surgery, and you may shower at that time.  You may have steri-strips (small skin tapes) in place directly over the incision.  These strips should be left on the skin for 7-10 days.  If your surgeon used skin glue on the incision, you may shower in 24 hours.  The glue will flake off over the next 2-3 weeks.  Any sutures or  staples will be removed at the office during your follow-up visit. 8. ACTIVITIES:  You may resume regular (light) daily activities beginning the next day--such as daily self-care, walking, climbing stairs--gradually increasing activities as tolerated.  You may have sexual intercourse when it is comfortable.  Refrain from any heavy lifting or straining until approved by your doctor. a. You may drive when you are no longer taking prescription pain medication, you can comfortably wear a seatbelt, and you can safely maneuver your car and apply brakes. b. RETURN TO WORK:  __________________________________________________________ 9. You should see your doctor in the office for a follow-up appointment approximately 2-3 weeks after your surgery.  Make sure that you call for this appointment within a day or two after you arrive home to insure a convenient appointment time. 10. OTHER INSTRUCTIONS:ok to shower starting tomorrow 11. Ice pack, tylenol, and ibuprofen also for pain 12. No lifting more than 15 to 20 pounds for 2 weeks __________________________________________________________________________________________________________________________ __________________________________________________________________________________________________________________________ WHEN TO CALL YOUR DOCTOR: 1. Fever over 101.0 2. Inability to urinate 3. Continued bleeding from incision. 4. Increased pain, redness, or drainage from the incision. 5. Increasing abdominal pain  The clinic staff is available to answer your questions during regular business hours.  Please don't hesitate to call and ask to speak to one of the nurses for clinical concerns.  If you have a medical emergency, go to the nearest emergency room or call 911.  A surgeon from [facility] is always on call at the hospital. [address],   [address], [place], [place]  [address] ? [address] [address], [place], [place]   [address] [phone] ?  [phone] ? FAX [phone] Web site: [email or web address] 

## 2020-06-02 NOTE — Op Note (Signed)

## 2020-06-02 NOTE — Anesthesia Procedure Notes (Signed)
Procedure Name: Intubation Date/Time: 06/02/2020 3:18 PM Performed by: Moshe Salisbury, CRNA Pre-anesthesia Checklist: Patient identified, Emergency Drugs available, Suction available and Patient being monitored Patient Re-evaluated:Patient Re-evaluated prior to induction Oxygen Delivery Method: Circle System Utilized Preoxygenation: Pre-oxygenation with 100% oxygen Induction Type: IV induction Ventilation: Oral airway inserted - appropriate to patient size Laryngoscope Size: Mac and 4 Grade View: Grade II Tube type: Oral Tube size: 7.0 mm Number of attempts: 1 Airway Equipment and Method: Stylet and Oral airway Placement Confirmation: ETT inserted through vocal cords under direct vision,  positive ETCO2 and breath sounds checked- equal and bilateral Tube secured with: Tape Dental Injury: Teeth and Oropharynx as per pre-operative assessment  Comments: AOI by Pierce Crane., SRNA under direct supervision.

## 2020-06-02 NOTE — Interval H&P Note (Signed)
History and Physical Interval Note: no change in H and P  06/02/2020 2:47 PM  Walter Edwards  has presented today for surgery, with the diagnosis of CHRONIC CHOLECYSTITIS.  The various methods of treatment have been discussed with the patient and family. After consideration of risks, benefits and other options for treatment, the patient has consented to  Procedure(s): LAPAROSCOPIC CHOLECYSTECTOMY (N/A) as a surgical intervention.  The patient's history has been reviewed, patient examined, no change in status, stable for surgery.  I have reviewed the patient's chart and labs.  Questions were answered to the patient's satisfaction.     [name]

## 2020-06-03 ENCOUNTER — Encounter (HOSPITAL_COMMUNITY): Payer: Self-pay | Admitting: Surgery

## 2020-06-05 LAB — SURGICAL PATHOLOGY

## 2020-06-14 ENCOUNTER — Other Ambulatory Visit: Payer: Self-pay

## 2020-06-14 ENCOUNTER — Ambulatory Visit (INDEPENDENT_AMBULATORY_CARE_PROVIDER_SITE_OTHER): Payer: 59 | Admitting: Podiatry

## 2020-06-14 ENCOUNTER — Encounter: Payer: Self-pay | Admitting: Podiatry

## 2020-06-14 DIAGNOSIS — M722 Plantar fascial fibromatosis: Secondary | ICD-10-CM

## 2020-06-14 DIAGNOSIS — M216X2 Other acquired deformities of left foot: Secondary | ICD-10-CM | POA: Diagnosis not present

## 2020-06-14 NOTE — Progress Notes (Signed)
Subjective:   Patient ID: Walter Edwards, male   DOB: 49 y.o.   MRN: [id number]   HPI Patient presents stating I am doing well with my left foot and tendon but I do still get quite a bit of achiness and I am not sure I will be able to go back to work on October 1 as I have to be able to wear boot and be very active   ROS      Objective:  Physical Exam  Neurovascular status intact negative [name]' sign noted with incision site intact on the left lower calf and left heel with stitches well coapted     Assessment:  Doing well post surgery normal to have some achiness and discomfort with diminished equinus condition     Plan:  All stitches removed dressings reapplied with compression continue boot usage gradual increase in activity we will have to evaluate and decide whether he can return to work on October 1 or may have to wait another month.  That decision will be made based on response in the next 2 to 3 weeks

## 2020-06-20 ENCOUNTER — Ambulatory Visit: Payer: Self-pay | Admitting: Emergency Medicine

## 2020-06-20 ENCOUNTER — Other Ambulatory Visit: Payer: Self-pay

## 2020-06-20 ENCOUNTER — Encounter: Payer: Self-pay | Admitting: Emergency Medicine

## 2020-06-20 VITALS — BP 142/99 | HR 107 | Temp 99.3°F | Resp 14 | Ht 70.0 in | Wt 225.0 lb

## 2020-06-20 DIAGNOSIS — Z0000 Encounter for general adult medical examination without abnormal findings: Secondary | ICD-10-CM

## 2020-06-20 NOTE — Progress Notes (Signed)
Occupational Health Provider Note       Time seen: 11:07 AM    I have reviewed the vital signs and the nursing notes.  HISTORY   Chief Complaint No chief complaint on file.   HPI Walter Edwards is a 49 y.o. male with a history of allergies, GERD, hyperlipidemia, hypertension, kidney stones, major depression who presents today for annual physical examination.  Patient is recovering from both a cholecystectomy and left foot surgery performed in the last month.  He feels like he is progressing well.  Denies any recent sickness.  Past Medical History:  Diagnosis Date  . Allergy   . GERD (gastroesophageal reflux disease)   . Hyperlipidemia   . Hypertension   . Kidney stones   . Low HDL (under 40)   . Mild depression ([place])   . Otitis media   . Seasonal rhinitis     Past Surgical History:  Procedure Laterality Date  . BACK SURGERY     x3  . CHOLECYSTECTOMY  06/02/2020  . CHOLECYSTECTOMY N/A 06/02/2020   Procedure: LAPAROSCOPIC CHOLECYSTECTOMY;  Surgeon: Coralie Keens, MD;  Location: [facility];  Service: General;  Laterality: N/A;  . COLONOSCOPY    . ENDOSCOPIC PLANTAR FASCIOTOMY  05/23/2020    Allergies Tessalon perles [benzonatate] and Hydrocodone   Review of Systems Constitutional: Negative for fever. Cardiovascular: Negative for chest pain. Respiratory: Negative for shortness of breath. Gastrointestinal: Positive for occasional abdominal pain, recent abdominal surgery Musculoskeletal: Positive for occasional left foot pain, recent left foot surgery Skin: Negative for rash. Neurological: Negative for headaches, focal weakness or numbness.  All systems negative/normal/unremarkable except as stated in the HPI  ____________________________________________   PHYSICAL EXAM:  VITAL SIGNS: Vitals:   06/20/20 1110  BP: (!) 142/99  Pulse: (!) 107  Resp: 14  Temp: 99.3 F (37.4 C)  SpO2: 97%    Constitutional: Alert and oriented. Well appearing and in no  distress. Eyes: Conjunctivae are normal. Normal extraocular movements. ENT      Head: Normocephalic and atraumatic.      Nose: No congestion/rhinnorhea.      Mouth/Throat: Mucous membranes are moist.      Neck: No stridor. Cardiovascular: Normal rate, regular rhythm. No murmurs, rubs, or gallops. Respiratory: Normal respiratory effort without tachypnea nor retractions. Breath sounds are clear and equal bilaterally. No wheezes/rales/rhonchi. Gastrointestinal: Soft and nontender.  Healing laparoscopic incision sites from cholecystectomy Musculoskeletal: Left lower leg is in a walking boot.  Otherwise normal range of motion Neurologic:  Normal speech and language. No gross focal neurologic deficits are appreciated.  Skin:  Skin is warm, dry and intact. No rash noted. Psychiatric: Speech and behavior are normal.  ____________________________________________  EKG: Interpreted by me.  Sinus rhythm with rate of 70 bpm, normal PR interval, normal QRS, normal QT  ____________________________________________   LABS (pertinent positives/negatives)  Recent Results (from the past 2160 hour(s))  Comprehensive metabolic panel     Status: Abnormal   Collection Time: 04/27/20  9:39 AM  Result Value Ref Range   Sodium 139 135 - 145 mEq/L   Potassium 4.2 3.5 - 5.1 mEq/L   Chloride 106 96 - 112 mEq/L   CO2 28 19 - 32 mEq/L   Glucose, Bld 104 (H) 70 - 99 mg/dL   BUN 13 6 - 23 mg/dL   Creatinine, Ser 0.97 0.40 - 1.50 mg/dL   Total Bilirubin 0.4 0.2 - 1.2 mg/dL   Alkaline Phosphatase 79 39 - 117 U/L   AST 16  0 - 37 U/L   ALT 22 0 - 53 U/L   Total Protein 6.9 6.0 - 8.3 g/dL   Albumin 4.3 3.5 - 5.2 g/dL   GFR 82.31 >60.00 mL/min   Calcium 8.8 8.4 - 10.5 mg/dL  Lipid panel     Status: Abnormal   Collection Time: 04/27/20  9:39 AM  Result Value Ref Range   Cholesterol 187 0 - 200 mg/dL    Comment: ATP III Classification       Desirable:  < 200 mg/dL               Borderline High:  200 - 239 mg/dL           High:  > = 240 mg/dL   Triglycerides 95.0 0 - 149 mg/dL    Comment: Normal:  <150 mg/dLBorderline High:  150 - 199 mg/dL   HDL 36.30 (L) >39.00 mg/dL   VLDL 19.0 0.0 - 40.0 mg/dL   LDL Cholesterol 132 (H) 0 - 99 mg/dL   Total CHOL/HDL Ratio 5     Comment:                Men          Women1/2 Average Risk     3.4          3.3Average Risk          5.0          4.42X Average Risk          9.6          7.13X Average Risk          15.0          11.0                       NonHDL 150.63     Comment: NOTE:  Non-HDL goal should be 30 mg/dL higher than patient's LDL goal (i.e. LDL goal of < 70 mg/dL, would have non-HDL goal of < 100 mg/dL)  C. trachomatis/N. gonorrhoeae RNA     Status: None   Collection Time: 04/27/20  9:39 AM   Specimen: Blood  Result Value Ref Range   C. trachomatis RNA, TMA NOT DETECTED NOT DETECT   N. gonorrhoeae RNA, TMA NOT DETECTED NOT DETECT    Comment: The analytical performance characteristics of this assay, when used to test SurePath(TM) specimens have been determined by [employer]. The modifications have not been cleared or approved by the FDA. This assay has been validated pursuant to the CLIA regulations and is used for clinical purposes. . For additional information, please refer to https://education.questdiagnostics.com/faq/FAQ154 (This link is being provided for information/ educational purposes only.) .   Hepatitis C antibody     Status: None   Collection Time: 04/27/20  9:39 AM  Result Value Ref Range   Hepatitis C Ab NON-REACTIVE NON-REACTI   SIGNAL TO CUT-OFF 0.01 <1.00    Comment: . HCV antibody was non-reactive. There is no laboratory  evidence of HCV infection. . In most cases, no further action is required. However, if recent HCV exposure is suspected, a test for HCV RNA (test code 217 [id number]) is suggested. . For additional information please refer to http://education.questdiagnostics.com/faq/FAQ22v1 (This link is being provided for  informational/ educational purposes only.) .   HIV Antibody (routine testing w rflx)     Status: None   Collection Time: 04/27/20  9:39 AM  Result Value Ref Range   HIV  1&2 Ab, 4th Generation NON-REACTIVE NON-REACTI    Comment: HIV-1 antigen and HIV-1/HIV-2 antibodies were not detected. There is no laboratory evidence of HIV infection. Marland Kitchen PLEASE NOTE: This information has been disclosed to you from records whose confidentiality may be protected by state law.  If your state requires such protection, then the state law prohibits you from making any further disclosure of the information without the specific written consent of the person to whom it pertains, or as otherwise permitted by law. A general authorization for the release of medical or other information is NOT sufficient for this purpose. . For additional information please refer to http://education.questdiagnostics.com/faq/FAQ106 (This link is being provided for informational/ educational purposes only.) . Marland Kitchen The performance of this assay has not been clinically validated in patients less than 76 years old. .   RPR     Status: None   Collection Time: 04/27/20  9:39 AM  Result Value Ref Range   RPR Ser Ql NON-REACTIVE NON-REACTI  Hemoglobin A1c     Status: None   Collection Time: 04/27/20  9:39 AM  Result Value Ref Range   Hgb A1c MFr Bld 5.4 4.6 - 6.5 %    Comment: Glycemic Control Guidelines for People with Diabetes:Non Diabetic:  <6%Goal of Therapy: <7%Additional Action Suggested:  >8%   CMP12+LP+TP+TSH+6AC+PSA+CBC.     Status: Abnormal   Collection Time: 05/19/20  8:39 AM  Result Value Ref Range   Glucose 103 (H) 65 - 99 mg/dL   Uric Acid 4.7 3.8 - 8.4 mg/dL    Comment:            Therapeutic target for gout patients: <6.0   BUN 11 6 - 24 mg/dL   Creatinine, Ser 1.01 0.76 - 1.27 mg/dL   GFR calc non Af Amer 88 >59 mL/min/1.73   GFR calc Af Amer 101 >59 mL/min/1.73    Comment: **[facility] currently reports eGFR  in compliance with the current**   recommendations of the [employer]. [facility] will   update reporting as new guidelines are published from the NKF-ASN   Task force.    BUN/Creatinine Ratio 11 9 - 20   Sodium 140 134 - 144 mmol/L   Potassium 4.8 3.5 - 5.2 mmol/L   Chloride 104 96 - 106 mmol/L   Calcium 8.9 8.7 - 10.2 mg/dL   Phosphorus 3.2 2.8 - 4.1 mg/dL   Total Protein 6.6 6.0 - 8.5 g/dL   Albumin 4.2 4.0 - 5.0 g/dL   Globulin, Total 2.4 1.5 - 4.5 g/dL   Albumin/Globulin Ratio 1.8 1.2 - 2.2   Bilirubin Total 0.3 0.0 - 1.2 mg/dL   Alkaline Phosphatase 106 48 - 121 IU/L   LDH 187 121 - 224 IU/L   AST 18 0 - 40 IU/L   ALT 24 0 - 44 IU/L   GGT 18 0 - 65 IU/L   Iron 66 38 - 169 ug/dL   Cholesterol, Total 181 100 - 199 mg/dL   Triglycerides 79 0 - 149 mg/dL   HDL 39 (L) >39 mg/dL   VLDL Cholesterol Cal 15 5 - 40 mg/dL   LDL Chol Calc (NIH) 127 (H) 0 - 99 mg/dL   Chol/HDL Ratio 4.6 0.0 - 5.0 ratio    Comment:                                   T. Chol/HDL Ratio  Men  Women                               1/2 Avg.Risk  3.4    3.3                                   Avg.Risk  5.0    4.4                                2X Avg.Risk  9.6    7.1                                3X Avg.Risk 23.4   11.0    Estimated CHD Risk 0.9 0.0 - 1.0 times avg.    Comment: The CHD Risk is based on the T. Chol/HDL ratio. Other factors affect CHD Risk such as hypertension, smoking, diabetes, severe obesity, and family history of premature CHD.    TSH 1.640 0.450 - 4.500 uIU/mL   T4, Total 6.6 4.5 - 12.0 ug/dL   T3 Uptake Ratio 25 24 - 39 %   Free Thyroxine Index 1.7 1.2 - 4.9   Prostate Specific Ag, Serum 6.4 (H) 0.0 - 4.0 ng/mL    Comment: Roche ECLIA methodology. According to the American Urological Association, Serum PSA should decrease and remain at undetectable levels after radical prostatectomy. The AUA defines biochemical recurrence as an  initial PSA value 0.2 ng/mL or greater followed by a subsequent confirmatory PSA value 0.2 ng/mL or greater. Values obtained with different assay methods or kits cannot be used interchangeably. Results cannot be interpreted as absolute evidence of the presence or absence of malignant disease.    WBC 5.3 3.4 - 10.8 x10E3/uL   RBC 5.03 4.14 - 5.80 x10E6/uL   Hemoglobin 15.5 13.0 - 17.7 g/dL   Hematocrit 44.3 37.5 - 51.0 %   MCV 88 79 - 97 fL   MCH 30.8 26.6 - 33.0 pg   MCHC 35.0 31 - 35 g/dL   RDW 12.4 11.6 - 15.4 %   Platelets 264 150 - 450 x10E3/uL   Neutrophils 62 Not Estab. %   Lymphs 20 Not Estab. %   Monocytes 12 Not Estab. %   Eos 3 Not Estab. %   Basos 1 Not Estab. %   Neutrophils Absolute 3.4 1 - 7 x10E3/uL   Lymphocytes Absolute 1.1 0 - 3 x10E3/uL   Monocytes Absolute 0.6 0 - 0 x10E3/uL   EOS (ABSOLUTE) 0.2 0.0 - 0.4 x10E3/uL   Basophils Absolute 0.0 0 - 0 x10E3/uL   Immature Granulocytes 2 Not Estab. %   Immature Grans (Abs) 0.1 0.0 - 0.1 x10E3/uL  QuantiFERON-TB Gold Plus     Status: None   Collection Time: 05/19/20  8:39 AM  Result Value Ref Range   QuantiFERON Incubation Incubation performed.    QuantiFERON Criteria Comment     Comment: The QuantiFERON-TB Gold Plus result is determined by subtracting the Nil value from either TB antigen (Ag) tube. The mitogen tube serves as a control for the test.    QuantiFERON TB1 Ag Value 0.19 IU/mL   QuantiFERON TB2 Ag Value 0.19 IU/mL   QuantiFERON Nil Value 0.18 IU/mL   QuantiFERON Mitogen Value >10.00 IU/mL  QuantiFERON-TB Gold Plus Negative Negative    Comment: Chemiluminescence immunoassay methodology  POCT urinalysis dipstick     Status: Normal   Collection Time: 05/19/20  9:13 AM  Result Value Ref Range   Color, UA Yellow    Clarity, UA Clear    Glucose, UA Negative Negative   Bilirubin, UA Negative    Ketones, UA Negative    Spec Grav, UA 1.020 1.010 - 1.025   Blood, UA Negative    pH, UA 6.0 5.0 - 8.0    Protein, UA Negative Negative   Urobilinogen, UA 0.2 0.2 or 1.0 E.U./dL   Nitrite, UA Negative    Leukocytes, UA Negative Negative   Appearance     Odor    SARS CORONAVIRUS 2 (TAT 6-24 HRS) Nasopharyngeal Nasopharyngeal Swab     Status: None   Collection Time: 05/30/20 10:45 AM   Specimen: Nasopharyngeal Swab  Result Value Ref Range   SARS Coronavirus 2 NEGATIVE NEGATIVE    Comment: (NOTE) SARS-CoV-2 target nucleic acids are NOT DETECTED.  The SARS-CoV-2 RNA is generally detectable in upper and lower respiratory specimens during the acute phase of infection. Negative results do not preclude SARS-CoV-2 infection, do not rule out co-infections with other pathogens, and should not be used as the sole basis for treatment or other patient management decisions. Negative results must be combined with clinical observations, patient history, and epidemiological information. The expected result is Negative.  Fact Sheet for Patients: SugarRoll.be  Fact Sheet for Healthcare Providers: https://www.woods-mathews.com/  This test is not yet approved or cleared by the [place] FDA and  has been authorized for detection and/or diagnosis of SARS-CoV-2 by FDA under an Emergency Use Authorization (EUA). This EUA will remain  in effect (meaning this test can be used) for the duration of the COVID-19 declaration under Se ction 564(b)(1) of the Act, 21 U.S.C. section 360bbb-3(b)(1), unless the authorization is terminated or revoked sooner.  Performed at [facility], [place] [address] [place], [place] [address]   Basic metabolic panel per protocol     Status: Abnormal   Collection Time: 05/30/20  2:22 PM  Result Value Ref Range   Sodium 138 135 - 145 mmol/L   Potassium 4.1 3.5 - 5.1 mmol/L   Chloride 104 98 - 111 mmol/L   CO2 24 22 - 32 mmol/L   Glucose, Bld 113 (H) 70 - 99 mg/dL    Comment: Glucose reference range applies only to samples taken  after fasting for at least 8 hours.   BUN 16 6 - 20 mg/dL   Creatinine, Ser 0.92 0.61 - 1.24 mg/dL   Calcium 9.1 8.9 - 10.3 mg/dL   GFR calc non Af Amer >60 >60 mL/min   GFR calc Af Amer >60 >60 mL/min   Anion gap 10 5 - 15    Comment: Performed at [facility] [address], [place], [place] [address]  CBC per protocol     Status: Abnormal   Collection Time: 05/30/20  2:22 PM  Result Value Ref Range   WBC 10.7 (H) 4.0 - 10.5 K/uL   RBC 5.36 4.22 - 5.81 MIL/uL   Hemoglobin 16.5 13.0 - 17.0 g/dL   HCT 48.6 39 - 52 %   MCV 90.7 80.0 - 100.0 fL   MCH 30.8 26.0 - 34.0 pg   MCHC 34.0 30.0 - 36.0 g/dL   RDW 12.4 11.5 - 15.5 %   Platelets 309 150 - 400 K/uL  nRBC 0.0 0.0 - 0.2 %    Comment: Performed at [facility], [place] [address] [place], [place] [address]  Surgical pathology     Status: None   Collection Time: 06/02/20  3:00 PM  Result Value Ref Range   SURGICAL PATHOLOGY      SURGICAL PATHOLOGY CASE: [id number] PATIENT: Joshva Argabright Surgical Pathology Report     Clinical History: chronic cholecystitis (cm)     FINAL MICROSCOPIC DIAGNOSIS:  A. GALLBLADDER, CHOLECYSTECTOMY: - Chronic cholecystitis    GROSS DESCRIPTION:  Size/?Intact: An intact gallbladder measuring 10.0 x 3.5 x 2.1 cm Serosal surface: Tan blue and smooth Mucosa/Wall: Mucosa is tan-yellow to green, level measures 0.2 cm in thickness Contents: Gallbladder contains a moderate amount of green-yellow bile, no calculi are identified in the gallbladder specimen container Cystic duct: 0.3 cm in diameter Block Summary: 1 block submitted  Craig Staggers 06/05/2020)    Final Diagnosis performed by Jaquita Folds, MD.   Electronically signed 06/05/2020 Technical component performed at [employer]. [facility], [place] [address], [place], [place] [address].  Professional component performed at [facility], [place] [address] [place], [place] [address].  Im munohistochemistry  Technical component (if applicable) was performed at [facility]. [address] [place], [place], [place] [address].   IMMUNOHISTOCHEMISTRY DISCLAIMER (if applicable): Some of these immunohistochemical stains may have been developed and the performance characteristics determine by [facility]. Some may not have been cleared or approved by the U.S. Food and Drug Administration. The FDA has determined that such clearance or approval is not necessary. This test is used for clinical purposes. It should not be regarded as investigational or for research. This laboratory is certified under the [place] (CLIA-88) as qualified to perform high complexity clinical laboratory testing.  The controls stained appropriately.      ASSESSMENT AND PLAN  Annual physical examination   Plan: The patient had presented for annual physical examination. Patient's labs are grossly unremarkable.  He appears to be progressing well from recent surgeries.  He does not need a refill of any medications at this time.  He will require clearance from his orthopedic surgeon prior to returning to full activity.  Lenise Arena MD    Note: This note was generated in part or whole with voice recognition software. Voice recognition is usually quite accurate but there are transcription errors that can and very often do occur. I apologize for any typographical errors that were not detected and corrected.

## 2020-06-29 ENCOUNTER — Other Ambulatory Visit: Payer: Self-pay

## 2020-06-29 MED ORDER — MONTELUKAST SODIUM 10 MG PO TABS: ORAL_TABLET | ORAL | 3 refills | Status: DC

## 2020-07-03 ENCOUNTER — Encounter: Payer: Self-pay | Admitting: Podiatry

## 2020-07-05 ENCOUNTER — Telehealth: Payer: Self-pay

## 2020-07-05 NOTE — Telephone Encounter (Signed)
Pt called concerned about his foot being numb. Please advise

## 2020-07-06 NOTE — Telephone Encounter (Signed)
Still early in healing. Can take up to 6 months for numbness to go away

## 2020-07-17 DIAGNOSIS — R109 Unspecified abdominal pain: Secondary | ICD-10-CM | POA: Diagnosis not present

## 2020-07-17 DIAGNOSIS — K5900 Constipation, unspecified: Secondary | ICD-10-CM | POA: Diagnosis not present

## 2020-07-21 ENCOUNTER — Other Ambulatory Visit: Payer: Self-pay | Admitting: Family Medicine

## 2020-07-28 DIAGNOSIS — Z09 Encounter for follow-up examination after completed treatment for conditions other than malignant neoplasm: Secondary | ICD-10-CM | POA: Diagnosis not present

## 2020-08-01 ENCOUNTER — Other Ambulatory Visit: Payer: Self-pay | Admitting: Surgery

## 2020-08-01 DIAGNOSIS — R109 Unspecified abdominal pain: Secondary | ICD-10-CM

## 2020-08-07 ENCOUNTER — Other Ambulatory Visit: Payer: Self-pay

## 2020-08-07 ENCOUNTER — Encounter: Payer: Self-pay | Admitting: Podiatry

## 2020-08-07 ENCOUNTER — Ambulatory Visit (INDEPENDENT_AMBULATORY_CARE_PROVIDER_SITE_OTHER): Payer: 59 | Admitting: Podiatry

## 2020-08-07 DIAGNOSIS — M722 Plantar fascial fibromatosis: Secondary | ICD-10-CM

## 2020-08-07 NOTE — Progress Notes (Signed)
Subjective:   Patient ID: Walter Edwards, male   DOB: 49 y.o.   MRN: [id number]   HPI Patient presents with some concerns about some numbness on the side of his left leg but overall states it seems to be gradually getting better and his heel continues to improve   ROS      Objective:  Physical Exam  Neurovascular status intact negative [name]' sign noted with mild sensory numbness on the outside of the left lower leg foot but it does appear to be improving     Assessment:  Doing well post gastroc recession and endoscopic heel with mild numbness but that appears to be getting better     Plan:  Instructed on stretching exercises anti-inflammatories physical therapy and patient is discharged and will return to work in the next 3 to 4 weeks as strength continues to build

## 2020-08-15 ENCOUNTER — Ambulatory Visit
Admission: RE | Admit: 2020-08-15 | Discharge: 2020-08-15 | Disposition: A | Discharge status: Home / Self Care | Payer: 59 | Source: Ambulatory Visit | Attending: Surgery | Admitting: Surgery

## 2020-08-15 DIAGNOSIS — R109 Unspecified abdominal pain: Secondary | ICD-10-CM

## 2020-08-15 DIAGNOSIS — N400 Enlarged prostate without lower urinary tract symptoms: Secondary | ICD-10-CM | POA: Diagnosis not present

## 2020-08-15 DIAGNOSIS — K429 Umbilical hernia without obstruction or gangrene: Secondary | ICD-10-CM | POA: Diagnosis not present

## 2020-08-15 DIAGNOSIS — K4020 Bilateral inguinal hernia, without obstruction or gangrene, not specified as recurrent: Secondary | ICD-10-CM | POA: Diagnosis not present

## 2020-08-15 DIAGNOSIS — Z981 Arthrodesis status: Secondary | ICD-10-CM | POA: Diagnosis not present

## 2020-08-15 MED ORDER — IOPAMIDOL (ISOVUE-300) INJECTION 61%
100.0000 mL | Freq: Once | INTRAVENOUS | Status: AC | PRN
  Administered 2020-08-15: 100 mL via INTRAVENOUS

## 2020-08-24 ENCOUNTER — Ambulatory Visit: Payer: Self-pay

## 2020-08-24 DIAGNOSIS — Z23 Encounter for immunization: Secondary | ICD-10-CM

## 2020-08-28 ENCOUNTER — Encounter: Payer: Self-pay | Admitting: Physician Assistant

## 2020-08-28 ENCOUNTER — Ambulatory Visit: Payer: Self-pay | Admitting: Physician Assistant

## 2020-08-28 ENCOUNTER — Other Ambulatory Visit: Payer: Self-pay

## 2020-08-28 VITALS — BP 138/90 | HR 97 | Temp 98.9°F | Resp 16 | Ht 70.0 in | Wt 220.0 lb

## 2020-08-28 DIAGNOSIS — S90812A Abrasion, left foot, initial encounter: Secondary | ICD-10-CM

## 2020-08-28 MED ORDER — NEOSPORIN PLUS PAIN RELIEF MS 3.5-10000-10 EX CREA: TOPICAL_CREAM | Freq: Two times a day (BID) | CUTANEOUS | 0 refills | Status: DC

## 2020-08-28 NOTE — Progress Notes (Signed)
Pt presents today complete physical with Ron Smith,PA-C. Pt states he does have some concerns he wants to mention and wants his left hill looked at.   [facility]

## 2020-08-28 NOTE — Progress Notes (Signed)
   Subjective: Left foot pain    Patient ID: Walter Edwards, male    DOB: 26-May-1971, 49 y.o.   MRN: [id number]  HPI Patient presents with left foot pain secondary to abrasion.  Patient state recently had foot surgery and has lost sensation in his foot.  Patient state he noticed an abrasion on the posterior heel secondary to rubbing by his shoes.  Patient concerned for infection developing.  Patient also wished to review his latest CT scan due to his continue mid and right abdominal pain.   Review of Systems    Abdominal pain and left foot pain. Objective:   Physical Exam  No acute distress.  Patient has an abrasion to the posterior to left heel.      Assessment & Plan: Left posterior heel abrasion  Patient abrasions clean, Neosporin applied, and bandaged.  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  Patient given discharge care instruction prescription for Neosporin.  Follow-up with podiatry.

## 2020-09-12 ENCOUNTER — Encounter: Payer: Self-pay | Admitting: Podiatry

## 2020-10-03 DIAGNOSIS — M5137 Other intervertebral disc degeneration, lumbosacral region: Secondary | ICD-10-CM | POA: Insufficient documentation

## 2020-10-17 DIAGNOSIS — Z20822 Contact with and (suspected) exposure to covid-19: Secondary | ICD-10-CM | POA: Diagnosis not present

## 2020-10-17 DIAGNOSIS — U071 COVID-19: Secondary | ICD-10-CM | POA: Diagnosis not present

## 2020-10-17 DIAGNOSIS — Z03818 Encounter for observation for suspected exposure to other biological agents ruled out: Secondary | ICD-10-CM | POA: Diagnosis not present

## 2020-10-27 IMAGING — DX DG CHEST 2V
2 series · 2 of 2 positions shown · non-contrast
Comparison: August 26, 2013

CLINICAL DATA: Chest pain

EXAM:
CHEST - 2 VIEW

[chest pa]
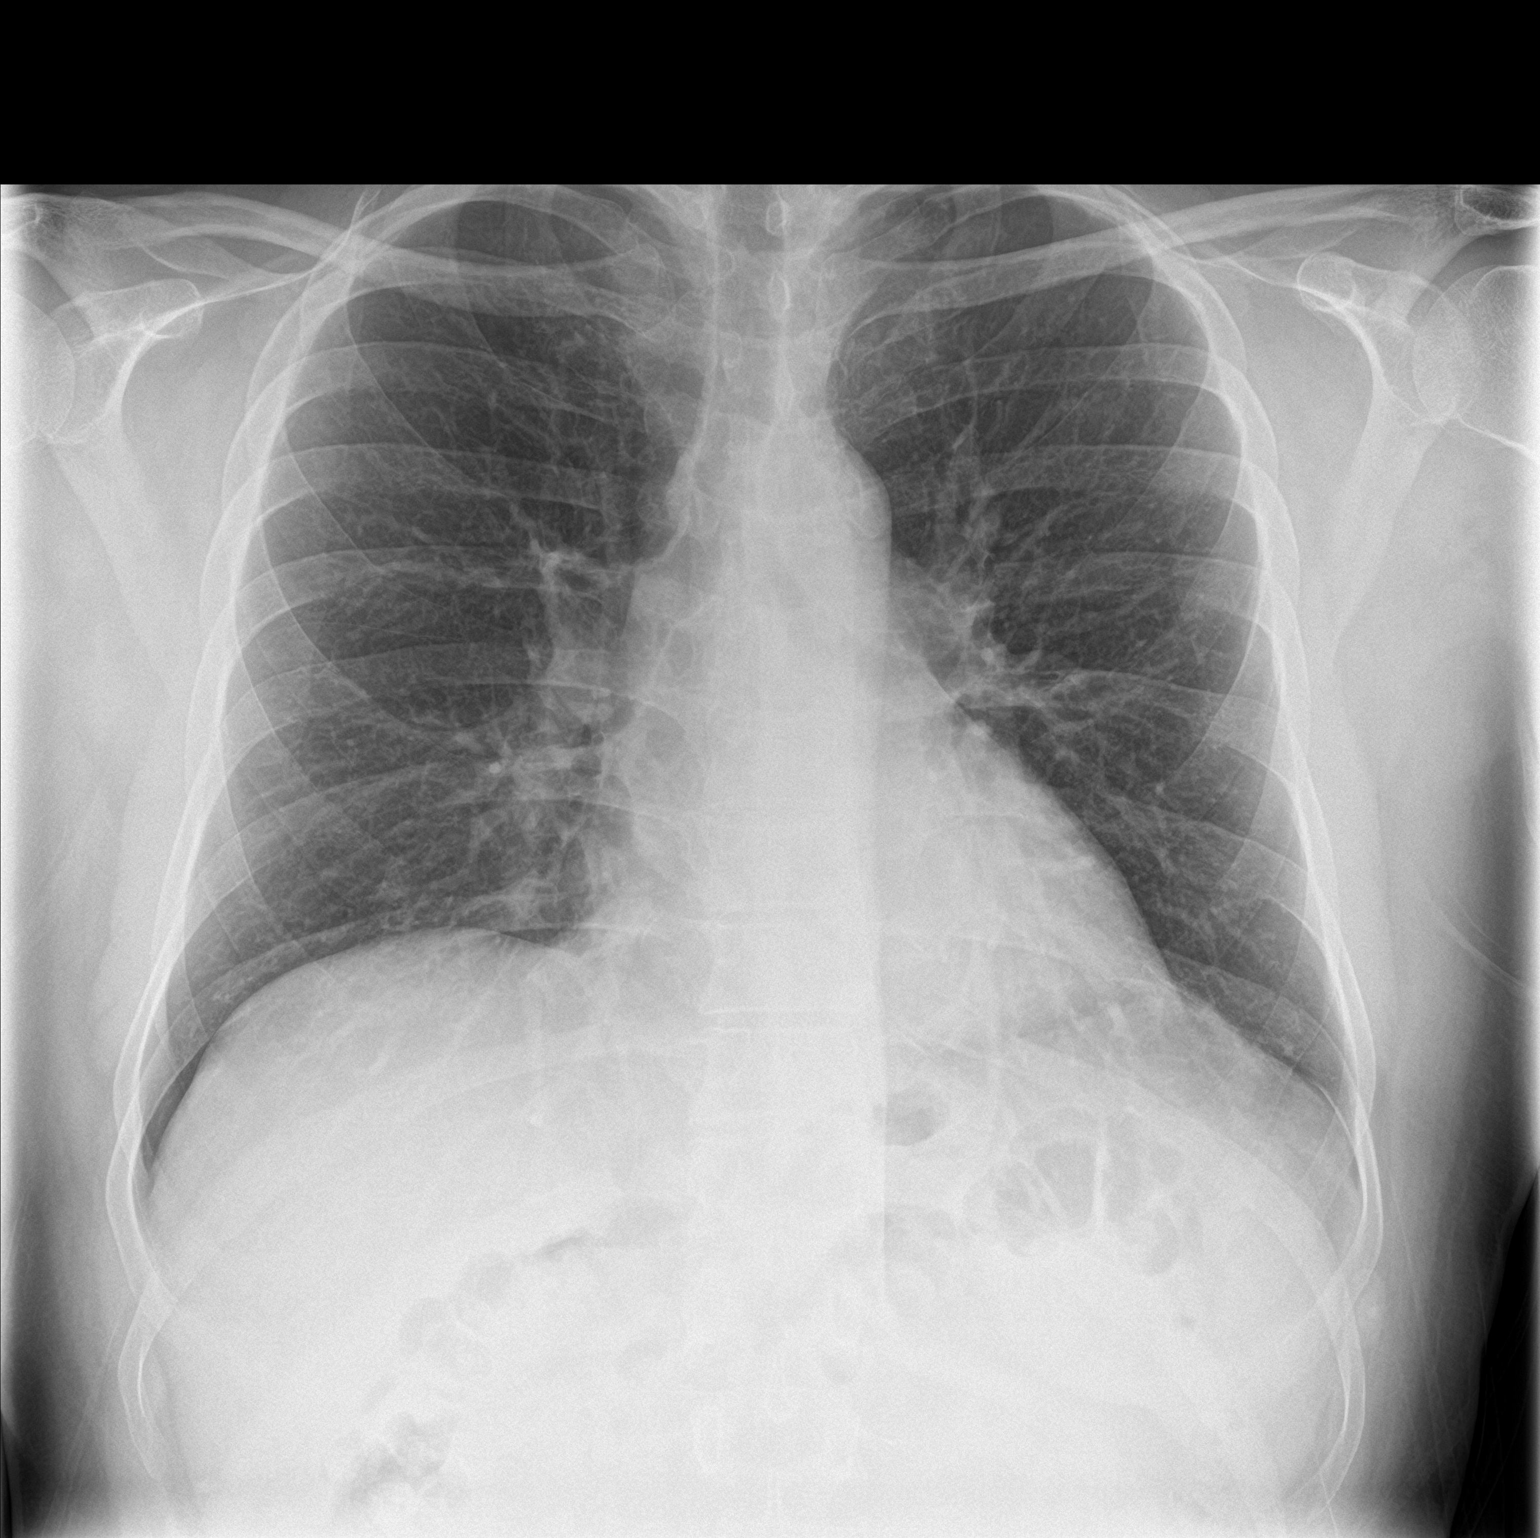

[chest lat]
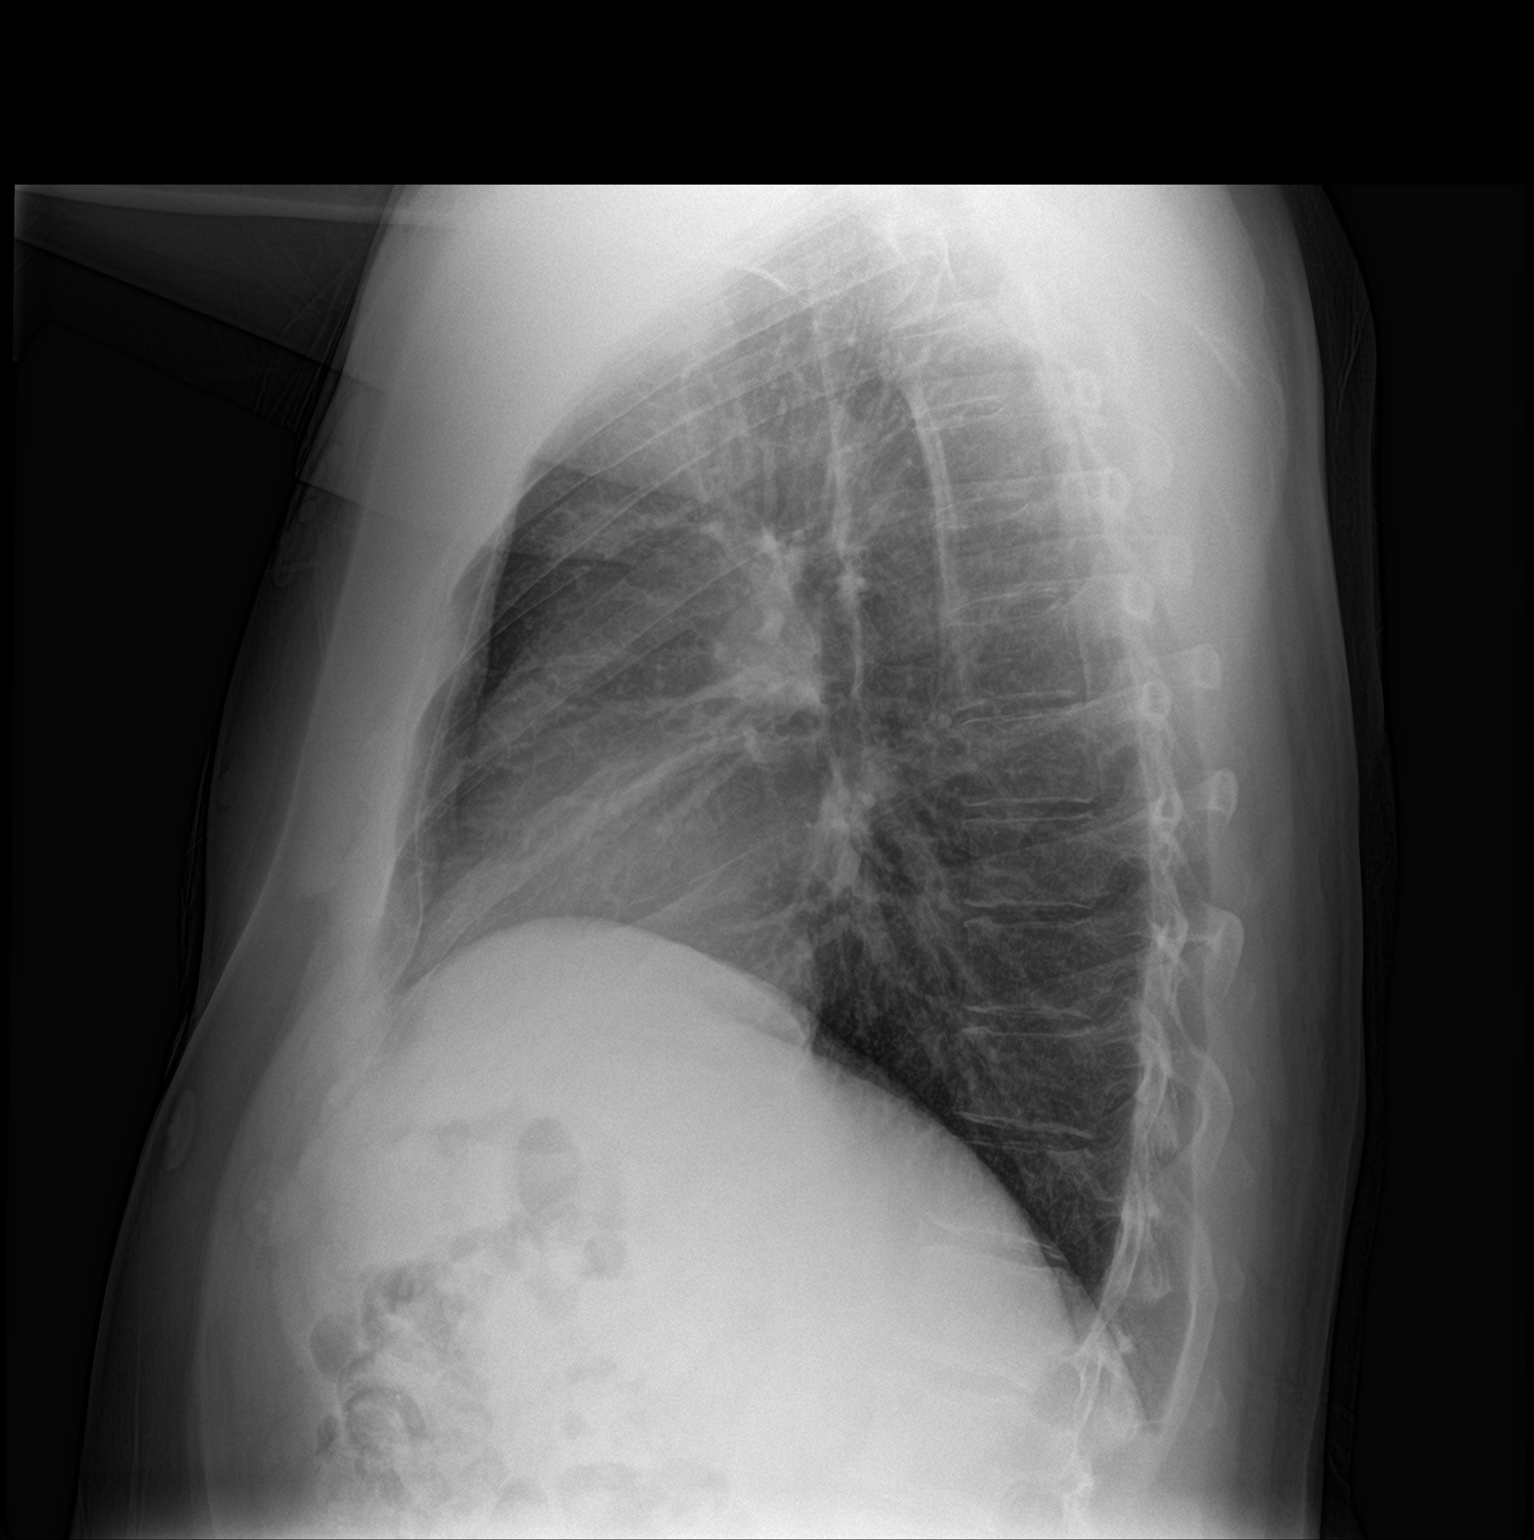

[2 of 2 positions shown; findings below may reference images not displayed]

FINDINGS: Lungs are clear. Heart size and pulmonary vascularity are normal. No
adenopathy. No pneumothorax. No bone lesions.
IMPRESSION: No edema or consolidation.

## 2020-11-01 DIAGNOSIS — Z03818 Encounter for observation for suspected exposure to other biological agents ruled out: Secondary | ICD-10-CM | POA: Diagnosis not present

## 2020-11-01 DIAGNOSIS — Z20822 Contact with and (suspected) exposure to covid-19: Secondary | ICD-10-CM | POA: Diagnosis not present

## 2020-11-10 ENCOUNTER — Telehealth: Payer: Self-pay | Admitting: Family Medicine

## 2020-11-10 DIAGNOSIS — J302 Other seasonal allergic rhinitis: Secondary | ICD-10-CM

## 2020-11-10 NOTE — Telephone Encounter (Signed)
I cannot find where we discussed allergy testing in the past. Please call pt to get more information or he can send a mychart  message to remind me of the symptoms he was having.

## 2020-11-10 NOTE — Telephone Encounter (Signed)
Patient states that he interested in doing an allergy testing that was talked about in his prior appt. Does he need to come in for a visit or will you send a referral? EM

## 2020-11-14 ENCOUNTER — Other Ambulatory Visit: Payer: Self-pay | Admitting: Internal Medicine

## 2020-11-14 ENCOUNTER — Other Ambulatory Visit: Payer: Self-pay | Admitting: Family Medicine

## 2020-11-14 DIAGNOSIS — I10 Essential (primary) hypertension: Secondary | ICD-10-CM

## 2020-11-15 NOTE — Assessment & Plan Note (Signed)
Pt note persistent symptoms and is interested in allergy testing and referral.

## 2020-11-15 NOTE — Telephone Encounter (Signed)
Patient states he has been trying Singular, Allegra-higher dose, nasal spray and allergies are not getting better. He states when he saw his previous PCP they did a little bit of allergy testing and it showed allergies to trees, grass, etc. And he was told to see allergy specialist for more in depth testing. Patient states he put this off due to dealing with other health issues and he said he discussed with Dr Selena Batten about waiting on this until other concerns were addressed.   He is ready to see allergist specialist-his symptoms are nasal congestion, drainage, sneezing, he also states sometimes in different restaurants he goes to as soon as he walks in his head feels stopped up, neck turns red with certain foods he eats. No chocking or difficulty with breathing at those times. He also gets severe headaches with mowing the grass and had to start having someone else do this for him, that is when he originally went to get some of the allergy testing with previous PCP.  Patient prefers [facility] location for the referral unless Dr Selena Batten prefers a certain office in [place] or has certain recommendations. CB: [phone].

## 2020-11-15 NOTE — Telephone Encounter (Signed)
Referral placed.

## 2020-11-15 NOTE — Addendum Note (Signed)
Addended by: Gweneth Dimitri R on: 11/15/2020 02:09 PM   Modules accepted: Orders

## 2020-11-15 NOTE — Telephone Encounter (Signed)
Patient advised and provided information for Allergy Asthma Office in [place].

## 2020-11-21 ENCOUNTER — Other Ambulatory Visit: Payer: Self-pay

## 2020-11-21 ENCOUNTER — Ambulatory Visit: Payer: Self-pay | Admitting: Adult Medicine

## 2020-11-21 ENCOUNTER — Encounter: Payer: Self-pay | Admitting: Adult Medicine

## 2020-11-21 VITALS — BP 133/101 | HR 104 | Temp 99.5°F | Resp 14 | Ht 71.0 in | Wt 215.0 lb

## 2020-11-21 DIAGNOSIS — J329 Chronic sinusitis, unspecified: Secondary | ICD-10-CM

## 2020-11-21 MED ORDER — ZINC GLUCONATE 50 MG PO TABS: 50.0000 mg | ORAL_TABLET | Freq: Two times a day (BID) | ORAL | 0 refills | Status: AC

## 2020-11-21 MED ORDER — VIT C-QUERCET-BIOFLV-BROMELAIN 450-250-125-50 MG PO CAPS: 2.0000 | ORAL_CAPSULE | Freq: Three times a day (TID) | ORAL | 0 refills | Status: AC

## 2020-11-21 MED ORDER — AMOXICILLIN-POT CLAVULANATE 875-125 MG PO TABS: 1.0000 | ORAL_TABLET | Freq: Two times a day (BID) | ORAL | 0 refills | Status: DC

## 2020-11-21 NOTE — Progress Notes (Signed)
   Subjective:    Patient ID: DAMANI KELEMEN, male    DOB: 1971-04-18, 50 y.o.   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   developed  Hx allergy sinus problems  Worse over lasted 10 yrtakes singulair, allegra fluonase allergy testing per MD Selena Batten 3/22  Worse in cold air  pcp stated MD Selena Batten Review of Systems     Objective:   Physical Exam Coughing vigorously  bp 133/101 t99.5 Heent Diggins at, throat non erythema + post nasal drip Nose with yellow encrustation, no palp tender to frontal maxilla sinus, retromandible submandible nontender lymph nodes present Pulm bronchial bs, no rhonchi a to e changes,  Cardia nsr no murmer Abd soft nontender +bs Ext no clubbing Neuro no focal findings        Assessment & Plan:  Sinus inflammation Augmentin 500mg   ordered  Cough due to post nasal yellow drip- clear lung finding  robitussinDM not given patient has rash allergy to benzonatate/  May use mucinex Instructed to Hydrate  Zinc, Quercitin C tid , Vitd  F/u as needed

## 2020-11-27 ENCOUNTER — Telehealth: Payer: Self-pay | Admitting: Internal Medicine

## 2020-11-27 NOTE — Telephone Encounter (Signed)
Hi Dr. Rhea Belton,  This patient would like to transfer from [facility] to you. He requested you as his provider. The patient states he was informed there was no other treatment for him at [facility]. We have records for this in Epic communicating  Colonoscopy/Pathology reports by [facility] providers    Please advise on scheduling.. Thank you

## 2020-11-28 NOTE — Telephone Encounter (Signed)
I received this note yesterday though I am just reading it today. I am asked to see the patient but see that he already has an appointment scheduled with me in 1 week. I have not yet reviewed records and while it may be acceptable to leave this appointment in place I would like to review the records prior to this visit.  In the future it would likely be best for me to review records before appointment is scheduled so that I can ensure appointment is appropriate.

## 2020-11-28 NOTE — Telephone Encounter (Signed)
Appointment has been cancelled. Walter Edwards did not inform us that he had been to [facility] when the appointment was scheduled. We apologize for the confusion to both the provider and the patient.Marland Kitchen He is aware we will get back to him once records are reviewed.

## 2020-12-05 ENCOUNTER — Ambulatory Visit: Payer: 59 | Admitting: Internal Medicine

## 2020-12-29 ENCOUNTER — Telehealth: Payer: Self-pay | Admitting: *Deleted

## 2020-12-29 NOTE — Telephone Encounter (Signed)
-----   Message from Beverley Fiedler, MD sent at 12/29/2020  1:43 PM EST ----- Pt needs an appt with me or APP  I see the prev telephone encounter -- ok for visit JMP

## 2021-01-01 ENCOUNTER — Encounter: Payer: Self-pay | Admitting: Internal Medicine

## 2021-01-01 NOTE — Telephone Encounter (Signed)
I have spoken to patient who has scheduled an appointment for 02/27/21 at 10:30 am.

## 2021-01-04 ENCOUNTER — Ambulatory Visit (INDEPENDENT_AMBULATORY_CARE_PROVIDER_SITE_OTHER): Payer: 59 | Admitting: Allergy

## 2021-01-04 ENCOUNTER — Encounter: Payer: Self-pay | Admitting: Allergy

## 2021-01-04 ENCOUNTER — Other Ambulatory Visit: Payer: Self-pay

## 2021-01-04 VITALS — BP 118/80 | HR 82 | Temp 98.2°F | Resp 18 | Ht 71.0 in | Wt 222.0 lb

## 2021-01-04 DIAGNOSIS — H1013 Acute atopic conjunctivitis, bilateral: Secondary | ICD-10-CM

## 2021-01-04 DIAGNOSIS — J3089 Other allergic rhinitis: Secondary | ICD-10-CM | POA: Diagnosis not present

## 2021-01-04 MED ORDER — EPINEPHRINE 0.3 MG/0.3ML IJ SOAJ: 0.3000 mg | Freq: Once | INTRAMUSCULAR | 0 refills | Status: AC

## 2021-01-04 MED ORDER — TRIAMCINOLONE ACETONIDE 55 MCG/ACT NA AERO: 2.0000 | INHALATION_SPRAY | Freq: Every day | NASAL | 5 refills | Status: AC

## 2021-01-04 NOTE — Addendum Note (Signed)
Addended by: Grier Rocher on: 01/04/2021 11:34 AM   Modules accepted: Orders

## 2021-01-04 NOTE — Addendum Note (Signed)
Addended by: Lorrin Mais on: 01/04/2021 11:36 AM   Modules accepted: Orders

## 2021-01-04 NOTE — Progress Notes (Signed)
New Patient Note  RE: Walter Edwards MRN: [id number] DOB: 06/02/1971 Date of Office Visit: 01/04/2021  Referring provider: Lynnda Child, MD Primary care provider: Lynnda Child, MD  Chief Complaint: Allergies  History of present illness: Walter Edwards is a 50 y.o. male presenting today for consultation for environmental allergies.  He would like to have allergy testing.  He has primary symptoms of nasal congestion and will have some drainage as well. Sometimes he has watery eyes.  He states he can develop nasal congestion when she goes in certain buildings like restaurants.  He does have nasal atrovent that he uses as needed. During spring and fall he does use it every day 2 sprays daily.  He states it does dry his nose out.  During winter or summer may use once or twice a week.  He is also taking generic allegra which was increased to 180mg  dosing about a year ago.  He is also on singulair daily.  He has been on singulair and allegra for past 4-5 years.  He states since starting the allergy medications he has noted improvement in his symptoms.  He also has done other things to help with symptom control like he quit mowing his yard several years which has helped.  He has had allergy testing via blood work about 5 years or so ago that was done by the doctor as he is a [employer].  He states it showed positive to tree pollen, grass pollen, egg and sesame seed. He states he was started on the allergy medication above at that time.  He does eat eggs often without issue.  He does avoid sesame however.   He states his coworker has noticed sometimes when he eats his neck gets red.  He states he doesn't feel any symptoms with that.    No history of asthma or eczema.    Review of systems: Review of Systems  Constitutional: Negative.   HENT: Positive for congestion.   Eyes: Negative.   Respiratory: Negative.   Cardiovascular: Negative.   Gastrointestinal: Negative.    Musculoskeletal: Negative.   Skin:       Neck erythema  Neurological: Negative.     All other systems negative unless noted above in HPI  Past medical history: Past Medical History:  Diagnosis Date  . Allergy   . GERD (gastroesophageal reflux disease)   . Hyperlipidemia   . Hypertension   . Kidney stones   . Low HDL (under 40)   . Mild depression (HCC)   . Otitis media   . Seasonal rhinitis     Past surgical history: Past Surgical History:  Procedure Laterality Date  . BACK SURGERY     x3  . CHOLECYSTECTOMY  06/02/2020  . CHOLECYSTECTOMY N/A 06/02/2020   Procedure: LAPAROSCOPIC CHOLECYSTECTOMY;  Surgeon: 06/04/2020, MD;  Location: [facility] OR;  Service: General;  Laterality: N/A;  . COLONOSCOPY    . ENDOSCOPIC PLANTAR FASCIOTOMY  05/23/2020    Family history:  Family History  Problem Relation Age of Onset  . Diabetes Mother   . Anxiety disorder Mother   . Hypertension Mother   . Diabetes Father   . Hypertension Father   . Obesity Sister   . Hypertension Sister     Social history: Lives in a mobile home with carpeting with electric heating and central cooling.  2 dogs in the home.  There is no concern for water damage, mildew in the home.  There is concern for roaches in the home.  He is a firefighter/EMT.  He denies a smoking history.   Medication List: Current Outpatient Medications  Medication Sig Dispense Refill  . amLODipine (NORVASC) 10 MG tablet TAKE 1 TABLET BY MOUTH ONCE A DAY 90 tablet 1  . amoxicillin-clavulanate (AUGMENTIN) 875-125 MG tablet Take 1 tablet by mouth 2 (two) times daily. 14 tablet 0  . cyclobenzaprine (FLEXERIL) 10 MG tablet Take 10 mg by mouth daily as needed (Back pain).     Marland Kitchen diphenhydrAMINE (BENADRYL) 25 MG tablet Take 25 mg by mouth See admin instructions. Takes with hydrocodone to prevent itching as needed    . docusate sodium (COLACE) 250 MG capsule Take 250 mg by mouth daily after lunch.     . fexofenadine (ALLEGRA) 180 MG  tablet Take 1 tablet (180 mg total) by mouth daily. 90 tablet 3  . HYDROcodone-acetaminophen (NORCO/VICODIN) 5-325 MG tablet Take 1 tablet by mouth every 6 (six) hours as needed for moderate pain (Back pain).     Marland Kitchen ibuprofen (ADVIL) 800 MG tablet Take 800 mg by mouth 3 (three) times daily.    Marland Kitchen ipratropium (ATROVENT) 0.06 % nasal spray Place 2 sprays into both nostrils in the morning and at bedtime. 15 mL 3  . montelukast (SINGULAIR) 10 MG tablet TAKE 1 TABLET BY MOUTH EVERY MORNING FORALLERGIES 90 tablet 3  . Multiple Vitamin (MULTIVITAMIN) tablet Take 1 tablet by mouth daily.    . pantoprazole (PROTONIX) 40 MG tablet TAKE 1 TABLET BY MOUTH ONCE DAILY 90 tablet 3  . polyethylene glycol powder (GLYCOLAX/MIRALAX) 17 GM/SCOOP powder Take 1 Scoop by mouth at bedtime. Clear lax    . psyllium (METAMUCIL) 58.6 % powder Take 1 packet by mouth at bedtime.    . Vit C-Quercet-Bioflv-Bromelain 450-250-125-50 MG CAPS Take 2 Doses by mouth 3 (three) times daily between meals. 42 capsule 0   No current facility-administered medications for this visit.    Known medication allergies: Allergies  Allergen Reactions  . Hydrocodone Itching  . Tessalon Perles [Benzonatate] Rash    itching     Physical examination: Blood pressure 118/80, pulse 82, temperature 98.2 F (36.8 C), temperature source Temporal, resp. rate 18, height 5\' 11"  (1.803 m), weight 222 lb (100.7 kg), SpO2 97 %.  General: Alert, interactive, in no acute distress. HEENT: PERRLA, TMs pearly gray, turbinates mildly edematous without discharge, post-pharynx non erythematous. Neck: Supple without lymphadenopathy. Lungs: Clear to auscultation without wheezing, rhonchi or rales. {no increased work of breathing. CV: Normal S1, S2 without murmurs. Abdomen: Nondistended, nontender. Skin: Warm and dry, without lesions or rashes. Extremities:  No clubbing, cyanosis or edema. Neuro:   Grossly intact.  Diagnositics/Labs:  Allergy testing:  Environmental allergy skin prick testing is positive grass pollens, tree pollens, weed pollens, molds, horse, dog, tobacco leaf.   Intradermal testing is positive to ragweed, mold mix 2 and 4 and cat Common food allergy skin prick testing is negative Allergy testing results were read and interpreted by provider, documented by clinical staff.   Assessment and plan: Allergic rhinitis with conjunctivitis  -Environmental allergy testing is positive to grass pollen, weed pollen, tree pollen, molds, cat, dog, horse ad tobacco leaf -Allergen avoidance measures discussed/handouts provided -Common food allergy testing is negative -Continue fexofenadine 180 mg daily as needed (continue daily use during spring and fall) -Continue montelukast 10 mg daily at bedtime -Start nasal steroid spray like Nasacort, Rhinocort or Flonase 2 sprays each nostril daily for 1-2 weeks at  a time before stopping once nasal congestion improves for maximum benefit -Reserve nasal Atrovent use for nasal drainage and can use 2 sprays each nostril up to 3-4 times a day if needed -For watery eyes can use over-the-counter Pataday 1 drop each eye daily as needed -Allergen immunotherapy discussed today including protocol, benefits and risk.  Informational handout provided.  If interested in this therapuetic option you can check with your insurance carrier for coverage. You can schedule new start appointment.  Let us know if [facility] is willing to provide your injections and we will send then information and consent form as well.    Follow-up in 4-6 months or sooner if needed  I appreciate the opportunity to take part in Mahonri's care. Please do not hesitate to contact me with questions.  Sincerely,   Margo Aye, MD Allergy/Immunology [facility]

## 2021-01-04 NOTE — Patient Instructions (Addendum)
-  Environmental allergy testing is positive to grass pollen, weed pollen, tree pollen, molds, cat, dog, horse ad tobacco leaf -Allergen avoidance measures discussed/handouts provided -Common food allergy testing is negative -Continue fexofenadine 180 mg daily as needed (continue daily use during spring and fall) -Continue montelukast 10 mg daily at bedtime -Start nasal steroid spray like Nasacort, Rhinocort or Flonase 2 sprays each nostril daily for 1-2 weeks at a time before stopping once nasal congestion improves for maximum benefit -Reserve nasal Atrovent use for nasal drainage and can use 2 sprays each nostril up to 3-4 times a day if needed -For watery eyes can use over-the-counter Pataday 1 drop each eye daily as needed -Allergen immunotherapy discussed today including protocol, benefits and risk.  Informational handout provided.  If interested in this therapuetic option you can check with your insurance carrier for coverage. You can schedule new start appointment.  Let us know if [facility] is willing to provide your injections and we will send then information and consent form as well.    Follow-up in 4-6 months or sooner if needed

## 2021-01-09 ENCOUNTER — Telehealth: Payer: Self-pay

## 2021-01-09 DIAGNOSIS — J3081 Allergic rhinitis due to animal (cat) (dog) hair and dander: Secondary | ICD-10-CM | POA: Diagnosis not present

## 2021-01-09 DIAGNOSIS — K219 Gastro-esophageal reflux disease without esophagitis: Secondary | ICD-10-CM | POA: Insufficient documentation

## 2021-01-09 DIAGNOSIS — K760 Fatty (change of) liver, not elsewhere classified: Secondary | ICD-10-CM | POA: Insufficient documentation

## 2021-01-09 NOTE — Progress Notes (Signed)
Aeroallergen Immunotherapy    Patient Details  Name: Walter Edwards  MRN: [id number]  Date of Birth: 01/03/1971   Order 1 of 2   Vial Label: Pollen, pet   0.3 ml (Volume) BAU Concentration -- 7 Grass Mix* 100,000 ([address] [place], [place], Ruth, Perennial [place], RedTop, Sweet Vernal, Timothy)  0.2 ml (Volume) 1:20 Concentration -- [place]  0.3 ml (Volume) BAU Concentration -- [place] 10,000  0.3 ml (Volume) 1:20 Concentration -- Ragweed Mix  0.2 ml (Volume) 1:20 Concentration -- Cocklebur  0.2 ml (Volume) 1:20 Concentration -- Burweed [place]  0.5 ml (Volume) 1:20 Concentration -- Eastern 10 Tree Mix (also Sweet Gum)  0.2 ml (Volume) 1:10 Concentration -- Pecan Pollen  0.2 ml (Volume) 1:10 Concentration -- Pine Mix  0.5 ml (Volume) 1:10 Concentration -- Cat Hair  0.5 ml (Volume) 1:10 Concentration -- Dog Epithelia    3.4 ml Extract Subtotal  1.6 ml Diluent  5.0 ml Maintenance Total    Final Concentration above is stated in weight/volume (wt/vol). Allergen units (AU/ml) biological units (BAU/ml). The total volume is 5 ml.    Schedule: B   Special Instructions: 1 injection/week

## 2021-01-09 NOTE — Progress Notes (Signed)
VIALS EXP 01-09-22 

## 2021-01-09 NOTE — Telephone Encounter (Signed)
PA submitted through cover my meds for: Pantoprazole Sodium 40mg  DR tablets.

## 2021-01-09 NOTE — Addendum Note (Signed)
Addended by: Lorrin Mais on: 01/09/2021 09:43 AM   Modules accepted: Orders

## 2021-01-09 NOTE — Telephone Encounter (Signed)
PA approved by [facility] for the following time period: 01/09/21 - 01/09/2024

## 2021-01-09 NOTE — Progress Notes (Signed)
Aeroallergen Immunotherapy    Patient Details  Name: Walter Edwards  MRN: [id number]  Date of Birth: Sep 10, 1971   Order 2 of 2   Vial Label: Mold   0.2 ml (Volume) 1:20 Concentration -- Alternaria alternata  0.2 ml (Volume) 1:10 Concentration -- Aspergillus mix  0.2 ml (Volume) 1:10 Concentration -- Penicillium mix  0.2 ml (Volume) 1:20 Concentration -- Drechslera spicifera  0.2 ml (Volume) 1:10 Concentration -- Fusarium moniliforme  0.2 ml (Volume) 1:40 Concentration -- Aureobasidium pullulans  0.2 ml (Volume) 1:10 Concentration -- Rhizopus oryzae  0.2 ml (Volume) 1:40 Concentration -- Phoma betae    1.6 ml Extract Subtotal  3.4 ml Diluent  5.0 ml Maintenance Total    Final Concentration above is stated in weight/volume (wt/vol). Allergen units (AU/ml) biological units (BAU/ml). The total volume is 5 ml.    Schedule: B   Special Instructions: 1 injection/week

## 2021-01-10 DIAGNOSIS — J302 Other seasonal allergic rhinitis: Secondary | ICD-10-CM | POA: Diagnosis not present

## 2021-01-12 ENCOUNTER — Telehealth: Payer: Self-pay

## 2021-01-12 NOTE — Telephone Encounter (Signed)
Pt left v/m that he went to [facility] allergist for testing and mid April pt is to start on allergy shots once a wk and pt wants to know if he can get allergy shots at [facility], I spoke with Ileene Rubens RN and was advised we do not give allergy shots at [facility]. Pt notified as instructed and voiced understanding.

## 2021-01-15 ENCOUNTER — Telehealth: Payer: Self-pay

## 2021-01-15 NOTE — Telephone Encounter (Signed)
Unfortunately he needs to get his allergy injections at a place where the vials can be properly monitored and stored which would need to be a medical office.  If he would like, the [facility] has a [place] location and can see if he would be able to receive his injections there at their office.  Otherwise need to come to [facility] for his injections or what ever of our locations would be the most convenient.

## 2021-01-15 NOTE — Telephone Encounter (Signed)
Patient called wanting to let Dr. Delorse Lek know that unfortunately his PCP does not want to take responsibility for giving him his allergy injections. 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 Patient is wanting to know if he can receive his allergy injections at the [employer] from his coworkers since they all are certified to administer injections. I informed patient that we usually want a nurse to administer them in case of reactions. Please advise.

## 2021-01-15 NOTE — Telephone Encounter (Signed)
Lm for pt to call us back about allergy injection sites

## 2021-01-15 NOTE — Telephone Encounter (Signed)
Spoke with pt and he is going to reach out to [facility]

## 2021-01-15 NOTE — Telephone Encounter (Signed)
Spoke with pt again he said with his work schedule that [facility] might now be the best as they are only open a few days aweek. Theres a ent in [place] he will reach out and see if they will do the injections for him and get back to [place]

## 2021-01-16 DIAGNOSIS — M62830 Muscle spasm of back: Secondary | ICD-10-CM | POA: Diagnosis not present

## 2021-01-16 DIAGNOSIS — M5136 Other intervertebral disc degeneration, lumbar region: Secondary | ICD-10-CM | POA: Diagnosis not present

## 2021-01-16 DIAGNOSIS — M9903 Segmental and somatic dysfunction of lumbar region: Secondary | ICD-10-CM | POA: Diagnosis not present

## 2021-01-16 DIAGNOSIS — M5416 Radiculopathy, lumbar region: Secondary | ICD-10-CM | POA: Diagnosis not present

## 2021-01-22 DIAGNOSIS — M62830 Muscle spasm of back: Secondary | ICD-10-CM | POA: Diagnosis not present

## 2021-01-22 DIAGNOSIS — M5416 Radiculopathy, lumbar region: Secondary | ICD-10-CM | POA: Diagnosis not present

## 2021-01-22 DIAGNOSIS — M5136 Other intervertebral disc degeneration, lumbar region: Secondary | ICD-10-CM | POA: Diagnosis not present

## 2021-01-22 DIAGNOSIS — M9903 Segmental and somatic dysfunction of lumbar region: Secondary | ICD-10-CM | POA: Diagnosis not present

## 2021-01-23 DIAGNOSIS — M5136 Other intervertebral disc degeneration, lumbar region: Secondary | ICD-10-CM | POA: Diagnosis not present

## 2021-01-23 DIAGNOSIS — M9903 Segmental and somatic dysfunction of lumbar region: Secondary | ICD-10-CM | POA: Diagnosis not present

## 2021-01-23 DIAGNOSIS — M62830 Muscle spasm of back: Secondary | ICD-10-CM | POA: Diagnosis not present

## 2021-01-23 DIAGNOSIS — M5416 Radiculopathy, lumbar region: Secondary | ICD-10-CM | POA: Diagnosis not present

## 2021-01-25 DIAGNOSIS — M62830 Muscle spasm of back: Secondary | ICD-10-CM | POA: Diagnosis not present

## 2021-01-25 DIAGNOSIS — M9903 Segmental and somatic dysfunction of lumbar region: Secondary | ICD-10-CM | POA: Diagnosis not present

## 2021-01-25 DIAGNOSIS — M5416 Radiculopathy, lumbar region: Secondary | ICD-10-CM | POA: Diagnosis not present

## 2021-01-25 DIAGNOSIS — M5136 Other intervertebral disc degeneration, lumbar region: Secondary | ICD-10-CM | POA: Diagnosis not present

## 2021-01-29 ENCOUNTER — Encounter: Payer: Self-pay | Admitting: Family Medicine

## 2021-01-29 ENCOUNTER — Other Ambulatory Visit: Payer: Self-pay

## 2021-01-29 ENCOUNTER — Ambulatory Visit (INDEPENDENT_AMBULATORY_CARE_PROVIDER_SITE_OTHER): Payer: 59 | Admitting: Family Medicine

## 2021-01-29 VITALS — BP 130/78 | HR 93 | Temp 98.5°F | Ht 71.0 in | Wt 218.2 lb

## 2021-01-29 DIAGNOSIS — M5136 Other intervertebral disc degeneration, lumbar region: Secondary | ICD-10-CM | POA: Diagnosis not present

## 2021-01-29 DIAGNOSIS — R1011 Right upper quadrant pain: Secondary | ICD-10-CM

## 2021-01-29 DIAGNOSIS — Q181 Preauricular sinus and cyst: Secondary | ICD-10-CM

## 2021-01-29 DIAGNOSIS — M5416 Radiculopathy, lumbar region: Secondary | ICD-10-CM | POA: Diagnosis not present

## 2021-01-29 DIAGNOSIS — M62830 Muscle spasm of back: Secondary | ICD-10-CM | POA: Diagnosis not present

## 2021-01-29 DIAGNOSIS — M9903 Segmental and somatic dysfunction of lumbar region: Secondary | ICD-10-CM | POA: Diagnosis not present

## 2021-01-29 NOTE — Patient Instructions (Signed)
#  Referral I have placed a referral to a specialist for you. You should receive a phone call from the specialty office. Make sure your voicemail is not full and that if you are able to answer your phone to unknown or new numbers.   It may take up to 2 weeks to hear about the referral. If you do not hear anything in 2 weeks, please call our office and ask to speak with the referral coordinator.   

## 2021-01-29 NOTE — Assessment & Plan Note (Signed)
Continues to have chronic abdominal pain. No improvement with surgery, PT. Some improvement with constipation treatment. Discussed that it is possible that regular bowel regimen may be his only option. Reviewed CT from 10/21 with patient. Agree with and appreciate GI second opinion

## 2021-01-29 NOTE — Progress Notes (Signed)
   Subjective:     Walter Edwards is a 50 y.o. male presenting for Mass (Small bump on top of R ear. Pt states that it swells off and on x 6 months. Recently been larger. )     HPI  #Ear mass - swelling intermittently - occasionally not noticeable - no pain - present for 6 months - no redness - not itchy  #Abdominal pain - has new appt with Dr. Di Kindle  - changing from [place] - seems to be better when constipation treated - worse with exercising so still limiting - had his gallbladder removed w/o improvement - Ct from 07/2020 with umblical hernia but otherwise no cause  Review of Systems   Social History   Tobacco Use  Smoking Status Never Smoker  Smokeless Tobacco Former User        Objective:    BP Readings from Last 3 Encounters:  01/29/21 130/78  01/04/21 118/80  11/21/20 (!) 133/101   Wt Readings from Last 3 Encounters:  01/29/21 218 lb 4 oz (99 kg)  01/04/21 222 lb (100.7 kg)  11/21/20 215 lb (97.5 kg)    BP 130/78   Pulse 93   Temp 98.5 F (36.9 C) (Temporal)   Ht 5\' 11"  (1.803 m)   Wt 218 lb 4 oz (99 kg)   SpO2 95%   BMI 30.44 kg/m    Physical Exam Constitutional:      Appearance: Normal appearance. He is not ill-appearing or diaphoretic.  HENT:     Right Ear: External ear normal.     Left Ear: External ear normal.     Nose: Nose normal.  Eyes:     General: No scleral icterus.    Extraocular Movements: Extraocular movements intact.     Conjunctiva/sclera: Conjunctivae normal.  Cardiovascular:     Rate and Rhythm: Normal rate.  Pulmonary:     Effort: Pulmonary effort is normal.  Musculoskeletal:     Cervical back: Neck supple.  Skin:    General: Skin is warm and dry.     Comments: Mobile subdermal cyst on the right medial helix  Neurological:     Mental Status: He is alert. Mental status is at baseline.  Psychiatric:        Mood and Affect: Mood normal.           Assessment & Plan:   Problem List Items Addressed This  Visit      Nervous and Auditory   Cyst on ear - Primary    Discussed likely benign lesion. He is interested in possible removal. Given location recommend dermatology referral.       Relevant Orders   Ambulatory referral to Dermatology     Other   RUQ abdominal pain    Continues to have chronic abdominal pain. No improvement with surgery, PT. Some improvement with constipation treatment. Discussed that it is possible that regular bowel regimen may be his only option. Reviewed CT from 10/21 with patient. Agree with and appreciate GI second opinion          Return if symptoms worsen or fail to improve.  11/21, MD  This visit occurred during the SARS-CoV-2 public health emergency.  Safety protocols were in place, including screening questions prior to the visit, additional usage of staff PPE, and extensive cleaning of exam room while observing appropriate contact time as indicated for disinfecting solutions.

## 2021-01-29 NOTE — Assessment & Plan Note (Signed)
Discussed likely benign lesion. He is interested in possible removal. Given location recommend dermatology referral.

## 2021-01-31 ENCOUNTER — Telehealth: Payer: Self-pay

## 2021-01-31 ENCOUNTER — Ambulatory Visit (INDEPENDENT_AMBULATORY_CARE_PROVIDER_SITE_OTHER): Payer: 59

## 2021-01-31 DIAGNOSIS — J309 Allergic rhinitis, unspecified: Secondary | ICD-10-CM | POA: Diagnosis not present

## 2021-01-31 NOTE — Telephone Encounter (Signed)
I believe this was addressed already.  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  It is not just providing the injection that we have to take into consideration; it is also storing the allergen vials appropriately which would need to be done in a medical office setting like an allergy office or his PCPs office which to my understanding they were not willing to do so.  I believe he is in the [place] area.  Was he able to ask the [facility] office if they could accommodate providing his allergy shots as they do have a office there?  If he is not able been able to ask them if he would like we can facilitate this.  However would like to know if their office location would be convenient for him.

## 2021-01-31 NOTE — Telephone Encounter (Signed)
Spoke with [facility] here in [place] they said I need to reach out to the [place] location tomorrow Thursday and talk to them about we need to get done for this pt to take his vials there will call tomorrow [phone]

## 2021-01-31 NOTE — Progress Notes (Signed)
Immunotherapy   Patient Details  Name: Walter Edwards MRN: [id number] Date of Birth: Jun 08, 1971  01/31/2021  [name] pt here to start injections blue vial Following schedule: b  Frequency:weekly Epi-Pen:yes Consent signed and patient instructions given.   [name] 01/31/2021, 9:17 AM

## 2021-01-31 NOTE — Telephone Encounter (Signed)
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

## 2021-02-01 DIAGNOSIS — D485 Neoplasm of uncertain behavior of skin: Secondary | ICD-10-CM | POA: Diagnosis not present

## 2021-02-01 DIAGNOSIS — D225 Melanocytic nevi of trunk: Secondary | ICD-10-CM | POA: Diagnosis not present

## 2021-02-01 DIAGNOSIS — D2261 Melanocytic nevi of right upper limb, including shoulder: Secondary | ICD-10-CM | POA: Diagnosis not present

## 2021-02-01 DIAGNOSIS — D2262 Melanocytic nevi of left upper limb, including shoulder: Secondary | ICD-10-CM | POA: Diagnosis not present

## 2021-02-01 DIAGNOSIS — L821 Other seborrheic keratosis: Secondary | ICD-10-CM | POA: Diagnosis not present

## 2021-02-01 DIAGNOSIS — D2271 Melanocytic nevi of right lower limb, including hip: Secondary | ICD-10-CM | POA: Diagnosis not present

## 2021-02-01 DIAGNOSIS — D2272 Melanocytic nevi of left lower limb, including hip: Secondary | ICD-10-CM | POA: Diagnosis not present

## 2021-02-05 DIAGNOSIS — D210 Benign neoplasm of connective and other soft tissue of head, face and neck: Secondary | ICD-10-CM | POA: Diagnosis not present

## 2021-02-06 ENCOUNTER — Ambulatory Visit (INDEPENDENT_AMBULATORY_CARE_PROVIDER_SITE_OTHER): Payer: 59 | Admitting: *Deleted

## 2021-02-06 DIAGNOSIS — J309 Allergic rhinitis, unspecified: Secondary | ICD-10-CM | POA: Diagnosis not present

## 2021-02-06 NOTE — Telephone Encounter (Signed)
Called and spoke with [facility] in [place] and spoke with Wynona Canes and she stated that he can continue his injections in their office. She did state that they have limited hours so they are open on Tuesdays and Thursdays from 8:30-12:30, 2-4:30 then Fridays from 1-4:30. She stated that he would just need to schedule an appointment to establish care with them and then we can send over his injection records and injection protocol paperwork and he can continue his injections there. Called patient and informed of hours and their requirements and he stated he could make this work. He stated that he will call them today and will keep [place] updated on the status. Their location is [address] [place] [address]. Phone number [phone], Fax [phone].

## 2021-02-12 ENCOUNTER — Ambulatory Visit (INDEPENDENT_AMBULATORY_CARE_PROVIDER_SITE_OTHER): Payer: 59 | Admitting: *Deleted

## 2021-02-12 DIAGNOSIS — J309 Allergic rhinitis, unspecified: Secondary | ICD-10-CM | POA: Diagnosis not present

## 2021-02-19 ENCOUNTER — Ambulatory Visit (INDEPENDENT_AMBULATORY_CARE_PROVIDER_SITE_OTHER): Payer: 59

## 2021-02-19 DIAGNOSIS — J309 Allergic rhinitis, unspecified: Secondary | ICD-10-CM | POA: Diagnosis not present

## 2021-02-23 ENCOUNTER — Encounter: Payer: Self-pay | Admitting: *Deleted

## 2021-02-27 ENCOUNTER — Encounter: Payer: Self-pay | Admitting: Internal Medicine

## 2021-02-27 ENCOUNTER — Ambulatory Visit (INDEPENDENT_AMBULATORY_CARE_PROVIDER_SITE_OTHER): Payer: 59 | Admitting: Internal Medicine

## 2021-02-27 ENCOUNTER — Ambulatory Visit (INDEPENDENT_AMBULATORY_CARE_PROVIDER_SITE_OTHER): Payer: 59 | Admitting: *Deleted

## 2021-02-27 VITALS — BP 120/82 | HR 85 | Ht 71.0 in | Wt 216.0 lb

## 2021-02-27 DIAGNOSIS — J309 Allergic rhinitis, unspecified: Secondary | ICD-10-CM | POA: Diagnosis not present

## 2021-02-27 DIAGNOSIS — K5909 Other constipation: Secondary | ICD-10-CM | POA: Diagnosis not present

## 2021-02-27 DIAGNOSIS — R1033 Periumbilical pain: Secondary | ICD-10-CM | POA: Diagnosis not present

## 2021-02-27 NOTE — Patient Instructions (Signed)
CAPSULE ENDOSCOPY PATIENT INSTRUCTION Walter Edwards 06-12-1971 [id number]   1. 03/13/21 Seven (7) days prior to capsule endoscopy stop taking iron supplements and carafate.  2. 03/18/21 Two (2) days prior to capsule endoscopy stop taking aspirin or any arthritis drugs.  3. 03/19/21 Day before capsule endoscopy purchase a 238 gram bottle of Miralax from the laxative section of your drug store, and a 32 oz. bottle of Gatorade (no red).    4. 03/19/21 One (1) day prior to capsule endoscopy: a) Stop smoking. b) Eat a regular diet until 12:00 Noon. c) After 12:00 Noon take only the following: Black coffee  Jell-O (no fruit or red Jell-o) Water   Bouillon (chicken or beef) 7-Up   Cranberry Juice Tea   Kool-Aid Popsicle (not red) Sprite   Coke Ginger Ale  Pepsi Mountain Dew Gatorade d) At 6:00 pm the evening before your appointment, drink 7 capfuls (105 grams) of Miralax with 32 oz. Gatorade. Drink 8 oz every 15 minutes until gone. e) Nothing to eat or drink after midnight except medications with a sip of water.  5. 03/20/21 Day of capsule endoscopy:  No medications for 2 hours prior to your test.  6. Please arrive at [facility]  3rd floor patient registration area by 8:30 am on: 03/20/21.   For any questions: Call [facility] at [phone] and ask to speak with one of the capsule endoscopy nurses.  YOU WILL NEED TO RETURN THE EQUIPMENT AT 4 PM ON THE DAY OF THE PROCEDURE.  PLEASE KEEP THIS IN MIND WHEN SCHEDULING.    Small Bowel Capsule Endoscopy  What you should know: Small Bowel capsule endoscopy is a procedure that takes pictures of the inside of your small intestine (bowel).  Your small bowel connects to your stomach on one end, and your large bowel (colon) on the other.  A capsule endoscopy is done by swallowing a pill size camera.  The capsule moves through your stomach and into your small bowel, where pictures are taken.   You may need a small bowel capsule  endoscopy if you have symptoms, such as blood in your stool, chronic stomach pain, and diarrhea.  The pictures may show if you have growths, swelling, and bleeding area in you small bowel.  A capsule endoscopy may also show if diseases such as Crohn's or celiac disease are causing your symptoms.  Having a small bowel capsule endoscopy may help you and your caregiver learn the cause of your symptoms.  Learning what is causing your symptoms allows you to receive needed treatment and prevent further problems. Risks: . You may have stomach pain during your procedure.   . The pictures taken by the capsule may not be clear.   . The pictures may not show the cause of your symptoms.   . You may need another endoscopy procedure.  .  The capsule may get trapped in your esophagus or intestines. You may need surgery or additional procedures to remove the capsule from your body.    Before your procedure: You will be instructed to stop certain prescription medications or over- the -counter medications prior to the procedure.   The day before your scheduled appointment you will need to be on a restricted diet and will need to drink a bowel prep that will clean out your bowels.   The day of the procedure: . You may drive yourself to the procedure.   . You will need to plan on 2 trips to the  office on the day of the procedure. Morning: . Plan to be at the office about 45 minutes. . The morning of the procedure a sensor belt and recorder will be placed on you.  You will wear this for 8 hours.  (The sensor belt transfers pictures of your small bowel to the recorder.)   You will be given a pill-sized capsule endoscope to swallow.  Once you swallow the capsule it will travel through your body the same way food does, constantly taking pictures along the way.  The capsule takes 2-3 pictures a second.   . Once you have left the office you may go about your normal day with a few exceptions: You may not go near a MRI  machine or a radio or television towers; You need to avoid other patients having capsule endoscopy; You will be given a written diet to follow for the day.  Afternoon: . You will need to be return to the office at your designated time. . The sensors belt will be removed . You will need to be at the office about 15 minutes. ___________________________________________________________  If you are age 39 or younger, your body mass index should be between 19-25. Your Body mass index is 30.13 kg/m. If this is out of the aformentioned range listed, please consider follow up with your Primary Care Provider.   Due to recent changes in healthcare laws, you may see the results of your imaging and laboratory studies on MyChart before your provider has had a chance to review them.  We understand that in some cases there may be results that are confusing or concerning to you. Not all laboratory results come back in the same time frame and the provider may be waiting for multiple results in order to interpret others.  Please give [place] 48 hours in order for your provider to thoroughly review all the results before contacting the office for clarification of your results.

## 2021-02-27 NOTE — Progress Notes (Signed)
Patient ID: Walter Edwards, male   DOB: 07/17/1971, 50 y.o.   MRN: [id number] HPI: Walter Edwards is a 50 year old male with a past medical history of constipation, chronic intermittent mid abdominal pain, gallbladder disease status postcholecystectomy in August 2021, history of GERD, hypertension, hyperlipidemia, umbilical and inguinal hernias seen by CT scan of the abdomen pelvis who is seen in consult at the request of Dr. Selena Battenody to evaluate intermittent mid abdominal pain.  He is here alone today.  He reports that this pain has been present on and off for several years.  He has had this evaluated multiple imaging tests as well as upper endoscopy and colonoscopy with [facility].  He reports he was then seen by Dr. Magnus IvanBlackman who recommended cholecystectomy and cholecystectomy was performed in August 2021 (pathology chronic cholecystitis).  He now is seeking second opinion.  He reports that about 3 years ago while hiking in an acute setting he developed a mid abdominal pain just above the umbilicus which was sharp and stabbing.  Was a very quick pain but worse with activity.  Etiology was never completely determined but he developed constipation in the months after this pain started.  Initially he was seen and diagnosed with IBS-C and placed on stool softeners and fiber.  The bowel regimen helped with his constipation but he would still have intermittent fullness and pressure in the mid abdomen.  This would be also associated with intermittent sharp more stabbing type pains.  Often he would have painful attacks and would then have a bowel movement and the pain would go away.  Pain is worse with activity.  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  He reports upper endoscopy and colonoscopy were performed which were unrevealing.  He was sent for physical therapy for musculoskeletal type pain which did not improve his symptoms.  He also tried Linzess and another laxative but this caused diarrhea.  Currently  his bowel habits are regular.  He has 3-4 soft but formed stools per day.  Rarely if he is active at work working on a fire he can get dehydrated and have transit constipation.  No blood in stool or melena.  He will still get these episodes of pain and a "circular" type area above the umbilicus radiating slightly to the right.  No upper GI symptoms.  He is using Colace at lunch and dinner, fiber powder dissolved in fluid midday and MiraLAX at bedtime.  He had a CT scan in October 2021.  After cholecystectomy did not improve his pain  EGD 03/15/20 --normal esophagus.  Normal stomach.  Normal duodenum. Colonoscopy 04/16/2019 --normal.  Nonbleeding internal hemorrhoids.   Past Medical History:  Diagnosis Date  . Allergy   . Gastritis   . GERD (gastroesophageal reflux disease)   . Hyperlipidemia   . Hypertension   . Inguinal hernia   . Internal hemorrhoids   . Kidney stones   . Low HDL (under 40)   . Mild depression (HCC)   . Otitis media   . Seasonal rhinitis     Past Surgical History:  Procedure Laterality Date  . BACK SURGERY     x3  . CHOLECYSTECTOMY  06/02/2020  . CHOLECYSTECTOMY N/A 06/02/2020   Procedure: LAPAROSCOPIC CHOLECYSTECTOMY;  Surgeon: Abigail MiyamotoBlackman, Douglas, MD;  [facility];  Service: General;  Laterality: N/A;  . COLONOSCOPY    . ENDOSCOPIC PLANTAR FASCIOTOMY  05/23/2020    Outpatient Medications Prior to Visit  Medication Sig Dispense Refill  .  amLODipine (NORVASC) 10 MG tablet TAKE 1 TABLET BY MOUTH ONCE A DAY 90 tablet 1  . cyclobenzaprine (FLEXERIL) 10 MG tablet Take 10 mg by mouth daily as needed (Back pain).     Marland Kitchen diphenhydrAMINE (BENADRYL) 25 MG tablet Take 25 mg by mouth See admin instructions. Takes with hydrocodone to prevent itching as needed    . docusate sodium (COLACE) 250 MG capsule Take 250 mg by mouth daily after lunch.     . EPINEPHrine 0.3 mg/0.3 mL IJ SOAJ injection Inject 0.3 mg into the muscle as needed.    . fexofenadine (ALLEGRA) 180 MG  tablet Take 1 tablet (180 mg total) by mouth daily. 90 tablet 3  . HYDROcodone-acetaminophen (NORCO/VICODIN) 5-325 MG tablet Take 1 tablet by mouth every 6 (six) hours as needed for moderate pain (Back pain).     Marland Kitchen ibuprofen (ADVIL) 800 MG tablet Take 800 mg by mouth 3 (three) times daily.    . montelukast (SINGULAIR) 10 MG tablet TAKE 1 TABLET BY MOUTH EVERY MORNING FORALLERGIES 90 tablet 3  . Multiple Vitamin (MULTIVITAMIN) tablet Take 1 tablet by mouth daily.    . pantoprazole (PROTONIX) 40 MG tablet TAKE 1 TABLET BY MOUTH ONCE DAILY 90 tablet 3  . polyethylene glycol powder (GLYCOLAX/MIRALAX) 17 GM/SCOOP powder Take 1 Scoop by mouth at bedtime. Clear lax    . triamcinolone (NASACORT) 55 MCG/ACT AERO nasal inhaler Place 2 sprays into the nose daily. 16.5 g 5  . Vit C-Quercet-Bioflv-Bromelain 450-250-125-50 MG CAPS Take 2 Doses by mouth 3 (three) times daily between meals. 42 capsule 0  . Wheat Dextrin (BENEFIBER PO) 1 Tablespoon    . psyllium (METAMUCIL) 58.6 % powder Take 1 packet by mouth at bedtime.     No facility-administered medications prior to visit.    Allergies  Allergen Reactions  . Hydrocodone Itching  . Tessalon Perles [Benzonatate] Rash    itching    Family History  Problem Relation Age of Onset  . Diabetes Mother   . Anxiety disorder Mother   . Hypertension Mother   . Diabetes Father   . Hypertension Father   . Obesity Sister   . Hypertension Sister   . Colon cancer Neg Hx   . Pancreatic cancer Neg Hx   . Esophageal cancer Neg Hx   . Liver cancer Neg Hx     Social History   Tobacco Use  . Smoking status: Never Smoker  . Smokeless tobacco: Former Clinical biochemist  . Vaping Use: Never used  Substance Use Topics  . Alcohol use: Yes    Comment: on the weekends maybe  . Drug use: Never    ROS: As per history of present illness, otherwise negative  BP 120/82   Pulse 85   Ht 5\' 11"  (1.803 m)   Wt 216 lb (98 kg)   SpO2 99%   BMI 30.13 kg/m   Constitutional: Well-developed and well-nourished. No distress. HEENT: Normocephalic and atraumatic. Oropharynx is clear and moist. Conjunctivae are normal.  No scleral icterus. Neck: Neck supple. Trachea midline. Cardiovascular: Normal rate, regular rhythm and intact distal pulses. No M/R/G Pulmonary/chest: Effort normal and breath sounds normal. No wheezing, rales or rhonchi. Abdominal: Soft, mild tenderness superior to the umbilibus, nondistended. Bowel sounds active throughout. There are no masses palpable. No hepatosplenomegaly. Extremities: no clubbing, cyanosis, or edema Neurological: Alert and oriented to person place and time. Skin: Skin is warm and dry.  Psychiatric: Normal mood and affect. Behavior is normal.  RELEVANT  LABS AND IMAGING: CBC    Component Value Date/Time   WBC 10.7 (H) 05/30/2020 1422   RBC 5.36 05/30/2020 1422   HGB 16.5 05/30/2020 1422   HGB 15.5 05/19/2020 0839   HCT 48.6 05/30/2020 1422   HCT 44.3 05/19/2020 0839   PLT 309 05/30/2020 1422   PLT 264 05/19/2020 0839   MCV 90.7 05/30/2020 1422   MCV 88 05/19/2020 0839   MCH 30.8 05/30/2020 1422   MCHC 34.0 05/30/2020 1422   RDW 12.4 05/30/2020 1422   RDW 12.4 05/19/2020 0839   LYMPHSABS 1.1 05/19/2020 0839   EOSABS 0.2 05/19/2020 0839   BASOSABS 0.0 05/19/2020 0839    CMP     Component Value Date/Time   NA 138 05/30/2020 1422   NA 140 05/19/2020 0839   K 4.1 05/30/2020 1422   CL 104 05/30/2020 1422   CO2 24 05/30/2020 1422   GLUCOSE 113 (H) 05/30/2020 1422   BUN 16 05/30/2020 1422   BUN 11 05/19/2020 0839   CREATININE 0.92 05/30/2020 1422   CALCIUM 9.1 05/30/2020 1422   PROT 6.6 05/19/2020 0839   ALBUMIN 4.2 05/19/2020 0839   AST 18 05/19/2020 0839   ALT 24 05/19/2020 0839   ALKPHOS 106 05/19/2020 0839   BILITOT 0.3 05/19/2020 0839   GFRNONAA >60 05/30/2020 1422   GFRAA >60 05/30/2020 1422   CLINICAL DATA:  Right-sided abdominal pain.   EXAM: CT ABDOMEN AND PELVIS WITH CONTRAST    TECHNIQUE: Multidetector CT imaging of the abdomen and pelvis was performed using the standard protocol following bolus administration of intravenous contrast.   CONTRAST:  ISOVUE-300 IOPAMIDOL (ISOVUE-300) INJECTION 61%   COMPARISON:  January 04, 2020   FINDINGS: Lower chest: The lung bases are clear. The heart size is normal.   Hepatobiliary: The liver is normal. Status post cholecystectomy.There is no biliary ductal dilation.   Pancreas: Normal contours without ductal dilatation. No peripancreatic fluid collection.   Spleen: Unremarkable.   Adrenals/Urinary Tract:   --Adrenal glands: Unremarkable.   --Right kidney/ureter: No hydronephrosis or radiopaque kidney stones.   --Left kidney/ureter: No hydronephrosis or radiopaque kidney stones.   --Urinary bladder: Unremarkable.   Stomach/Bowel:   --Stomach/Duodenum: No hiatal hernia or other gastric abnormality. Normal duodenal course and caliber.   --Small bowel: Unremarkable.   --Colon: Unremarkable.   --Appendix: Normal.   Vascular/Lymphatic: Normal course and caliber of the major abdominal vessels.   --No retroperitoneal lymphadenopathy.   --No mesenteric lymphadenopathy.   --No pelvic or inguinal lymphadenopathy.   Reproductive: The prostate gland is enlarged.   Other: No ascites or free air. There are bilateral fat containing inguinal hernias. There is a fat containing umbilical hernia.   Musculoskeletal. Patient is status post prior L5-S1 posterior fusion. There is an interbody spacer at the L5-S1 level.   IMPRESSION: 1. No CT findings to explain the patient's right-sided abdominal pain. 2. Status post cholecystectomy. 3. Fat containing umbilical and bilateral inguinal hernias. 4. Enlarged prostate gland.     Electronically Signed   By: Katherine Mantle M.D.   On: 08/17/2020 19:49  ASSESSMENT/PLAN: 50 year old male with a past medical history of constipation, chronic intermittent mid  abdominal pain, gallbladder disease status postcholecystectomy in August 2021, history of GERD, hypertension, hyperlipidemia, umbilical and inguinal hernias seen by CT scan of the abdomen pelvis who is seen in consult at the request of Dr. Selena Batten to evaluate intermittent mid abdominal pain.   1.  Intermittent mid abdominal pain --the nature of his pain  raises the question of symptomatic umbilical hernia or internal hernia.  It relates more to activity and movement increasing intra-abdominal pressure.  Pain does improve with bowel movement but there is no clinical evidence of constipation.  He is maintaining a healthy bowel movement regimen with dietary fiber, stool softener and MiraLAX.  His CT scan of the intestines, upper endoscopy and colonoscopy are all reassuring.  The pain did not improve with cholecystectomy.  The only remaining GI evaluation to exclude luminal disease would be video capsule endoscopy.  We will proceed as follows: --Video capsule endoscopy rule out enteritis -- If video capsule endoscopy negative I would recommend that he visit back with Dr. Magnus Ivan to discuss umbilical hernia repair  2.  Chronic constipation --continue stool softener twice daily, MiraLAX daily and fiber supplement  3.  CRC screening --normal colonoscopy in 2020, repeat recommended in 2030    IP:JASN, Chryl Heck, Md [address] [place],  [place] [address]

## 2021-02-28 ENCOUNTER — Telehealth: Payer: Self-pay | Admitting: Internal Medicine

## 2021-02-28 NOTE — Telephone Encounter (Signed)
I have spoken to patient and have advised that we have rescheduled his capsule endoscopy to 03/21/21. He will change his prep instructions accordingly.

## 2021-02-28 NOTE — Telephone Encounter (Signed)
Inbound call from patient. Have Capsule Endo scheduled 5/31. 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 Best contact number [phone]

## 2021-03-02 ENCOUNTER — Telehealth: Payer: Self-pay | Admitting: Podiatry

## 2021-03-02 DIAGNOSIS — M722 Plantar fascial fibromatosis: Secondary | ICD-10-CM | POA: Diagnosis not present

## 2021-03-02 NOTE — Telephone Encounter (Signed)
Pt left message statng he was wanting to get another pair of orthotics.  I returned the call and pt wants to bill the insurance for another pair of orthotics and he has called his insurance 2 times and got 2 different answers about 2 prs. I told him if he gets these and within 6 months orders an additional pair they are 219 self pay or if he wants to get the old ones refurbished it would be 90.00 self pay. He wants another pair ordered just like the last ones.

## 2021-03-05 ENCOUNTER — Ambulatory Visit (INDEPENDENT_AMBULATORY_CARE_PROVIDER_SITE_OTHER): Payer: 59 | Admitting: *Deleted

## 2021-03-05 DIAGNOSIS — J309 Allergic rhinitis, unspecified: Secondary | ICD-10-CM

## 2021-03-08 DIAGNOSIS — H11002 Unspecified pterygium of left eye: Secondary | ICD-10-CM | POA: Diagnosis not present

## 2021-03-12 ENCOUNTER — Other Ambulatory Visit: Payer: Self-pay

## 2021-03-12 ENCOUNTER — Ambulatory Visit (INDEPENDENT_AMBULATORY_CARE_PROVIDER_SITE_OTHER): Payer: 59

## 2021-03-12 DIAGNOSIS — J309 Allergic rhinitis, unspecified: Secondary | ICD-10-CM | POA: Diagnosis not present

## 2021-03-12 NOTE — Progress Notes (Signed)
err

## 2021-03-14 ENCOUNTER — Telehealth: Payer: Self-pay | Admitting: Podiatry

## 2021-03-14 NOTE — Telephone Encounter (Signed)
Orthotics in..left message for pt ok to pick up.Marland KitchenMarland Kitchen

## 2021-03-21 ENCOUNTER — Ambulatory Visit (INDEPENDENT_AMBULATORY_CARE_PROVIDER_SITE_OTHER): Payer: 59 | Admitting: Internal Medicine

## 2021-03-21 ENCOUNTER — Ambulatory Visit (INDEPENDENT_AMBULATORY_CARE_PROVIDER_SITE_OTHER): Payer: 59

## 2021-03-21 ENCOUNTER — Encounter: Payer: Self-pay | Admitting: Internal Medicine

## 2021-03-21 DIAGNOSIS — J309 Allergic rhinitis, unspecified: Secondary | ICD-10-CM | POA: Diagnosis not present

## 2021-03-21 DIAGNOSIS — K529 Noninfective gastroenteritis and colitis, unspecified: Secondary | ICD-10-CM | POA: Diagnosis not present

## 2021-03-21 DIAGNOSIS — R109 Unspecified abdominal pain: Secondary | ICD-10-CM | POA: Diagnosis not present

## 2021-03-21 NOTE — Progress Notes (Signed)
SN: [id number] Exp: 06/05/2022 LOT: [id number] Patient arrived for Capsule Endoscopy. Reported the prep went well. This nurse explained dietary restrictions for the next few hours. Patient verbalized understanding. Opened capsule, ensured capsule was flashing prior to the patient swallowing the capsule. Patient swallowed capsule without difficulty. Patient instructed to return to the office at 4:00 pm today for removal of the recording equipment, to call the office with any questions and if no capsule was visualized after 72 hours. No further questions by the conclusion of the visit.

## 2021-03-23 ENCOUNTER — Telehealth: Payer: Self-pay

## 2021-03-23 DIAGNOSIS — T189XXA Foreign body of alimentary tract, part unspecified, initial encounter: Secondary | ICD-10-CM

## 2021-03-23 NOTE — Telephone Encounter (Signed)
Patient notified of the results and recommendations He understands to contact Dr. Eliberto Ivory office for appointment to discuss hernia repair.

## 2021-03-23 NOTE — Telephone Encounter (Signed)
-----   Message from Beverley Fiedler, MD sent at 03/22/2021  5:09 PM EDT ----- Regarding: VCE Please let patient know that his video capsule endoscopy was normal There is no evidence of enteritis or Crohn's disease  My recommendation is at this point is that he return to see Dr. Magnus Ivan for consideration of umbilical hernia repair  Thanks JMP

## 2021-03-26 NOTE — Addendum Note (Signed)
Addended by: Annett Fabian on: 03/26/2021 01:48 PM   Modules accepted: Orders

## 2021-03-26 NOTE — Telephone Encounter (Signed)
I left a detailed message for the patient to come for a KUB or to call back if he had additional questions or concerns.

## 2021-03-26 NOTE — Telephone Encounter (Signed)
Patient called in with capsule questions. States he have not pass it and wanted to discuss it. Best contact [phone]

## 2021-03-27 ENCOUNTER — Other Ambulatory Visit: Payer: Self-pay

## 2021-03-27 ENCOUNTER — Ambulatory Visit (INDEPENDENT_AMBULATORY_CARE_PROVIDER_SITE_OTHER): Payer: 59 | Admitting: *Deleted

## 2021-03-27 ENCOUNTER — Ambulatory Visit (INDEPENDENT_AMBULATORY_CARE_PROVIDER_SITE_OTHER)
Admission: RE | Admit: 2021-03-27 | Discharge: 2021-03-27 | Disposition: A | Discharge status: Home / Self Care | Payer: 59 | Source: Ambulatory Visit | Attending: Internal Medicine | Admitting: Internal Medicine

## 2021-03-27 DIAGNOSIS — J309 Allergic rhinitis, unspecified: Secondary | ICD-10-CM

## 2021-03-27 DIAGNOSIS — R935 Abnormal findings on diagnostic imaging of other abdominal regions, including retroperitoneum: Secondary | ICD-10-CM | POA: Diagnosis not present

## 2021-03-27 DIAGNOSIS — T189XXA Foreign body of alimentary tract, part unspecified, initial encounter: Secondary | ICD-10-CM

## 2021-04-02 ENCOUNTER — Ambulatory Visit (INDEPENDENT_AMBULATORY_CARE_PROVIDER_SITE_OTHER): Payer: 59

## 2021-04-02 DIAGNOSIS — J309 Allergic rhinitis, unspecified: Secondary | ICD-10-CM | POA: Diagnosis not present

## 2021-04-10 ENCOUNTER — Ambulatory Visit (INDEPENDENT_AMBULATORY_CARE_PROVIDER_SITE_OTHER): Payer: 59 | Admitting: *Deleted

## 2021-04-10 DIAGNOSIS — J309 Allergic rhinitis, unspecified: Secondary | ICD-10-CM | POA: Diagnosis not present

## 2021-04-16 ENCOUNTER — Telehealth: Payer: Self-pay | Admitting: Podiatry

## 2021-04-16 ENCOUNTER — Ambulatory Visit (INDEPENDENT_AMBULATORY_CARE_PROVIDER_SITE_OTHER): Payer: 59

## 2021-04-16 DIAGNOSIS — J309 Allergic rhinitis, unspecified: Secondary | ICD-10-CM | POA: Diagnosis not present

## 2021-04-16 DIAGNOSIS — M722 Plantar fascial fibromatosis: Secondary | ICD-10-CM | POA: Diagnosis not present

## 2021-04-16 NOTE — Telephone Encounter (Signed)
Pt returned call and is wanting to proceed with another pair of orthotics just like the last ones he received and I told pt I would order and bill them for today.I will call when they come in.

## 2021-04-16 NOTE — Telephone Encounter (Signed)
Pt left message stating he talked with his insurance and they would cover another pair of orthotics if they were billed by end of this month..  I returned call and left message for pt to call to discuss that I definitely could get it billed and ordered by end of month but just need to talk directly to him to confirm.

## 2021-04-25 ENCOUNTER — Ambulatory Visit (INDEPENDENT_AMBULATORY_CARE_PROVIDER_SITE_OTHER): Payer: 59

## 2021-04-25 DIAGNOSIS — J309 Allergic rhinitis, unspecified: Secondary | ICD-10-CM

## 2021-04-25 IMAGING — NM NM HEPATO W/GB/PHARM/[PERSON_NAME]
2 series · 12 of 12 positions shown · non-contrast
Comparison: Ultrasound 12/04/2018.

CLINICAL DATA: Right upper quadrant pain.

EXAM:
NUCLEAR MEDICINE HEPATOBILIARY IMAGING WITH GALLBLADDER EF
TECHNIQUE: Sequential images of the abdomen were obtained [DATE] minutes
following intravenous administration of radiopharmaceutical. After
oral ingestion of Ensure, gallbladder ejection fraction was
determined. At 60 min, normal ejection fraction is greater than 33%.
RADIOPHARMACEUTICALS:  5.3 mCi Sc-66m  Choletec IV

[he hepatobiliary · 4.52mm/px · 6 of 60 frames shown (1 of 2)]
[frame 6/60]
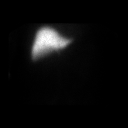
[frame 16/60]
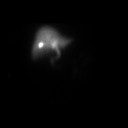
[frame 26/60]
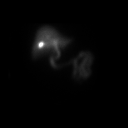
[frame 36/60]
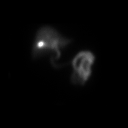
[frame 46/60]
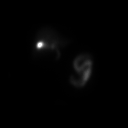
[frame 56/60]
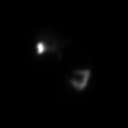

[he hepatobiliary · 4.52mm/px · 6 of 60 frames shown (2 of 2)]
[frame 6/60]
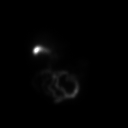
[frame 16/60]
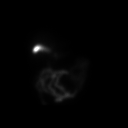
[frame 26/60]
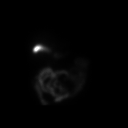
[frame 36/60]
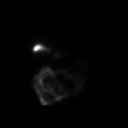
[frame 46/60]
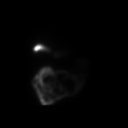
[frame 56/60]
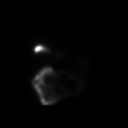

[12 of 12 positions shown; findings below may reference images not displayed]

FINDINGS: Prompt uptake and biliary excretion of activity by the liver is
seen. Gallbladder activity is visualized, consistent with patency of
cystic duct. Biliary activity passes into small bowel, consistent
with patent common bile duct.

Calculated gallbladder ejection fraction is 40%. (Normal gallbladder
ejection fraction with Ensure is greater than 33%.)
IMPRESSION: Normal exam with gallbladder ejection fraction of 40%.

## 2021-04-27 ENCOUNTER — Other Ambulatory Visit: Payer: Self-pay | Admitting: Family Medicine

## 2021-04-27 DIAGNOSIS — I10 Essential (primary) hypertension: Secondary | ICD-10-CM

## 2021-04-27 NOTE — Telephone Encounter (Signed)
Pt needs appt. An annual physical would be ideal. 30 day refill sent in.

## 2021-04-30 ENCOUNTER — Other Ambulatory Visit: Payer: Self-pay | Admitting: Family Medicine

## 2021-04-30 DIAGNOSIS — R972 Elevated prostate specific antigen [PSA]: Secondary | ICD-10-CM

## 2021-04-30 DIAGNOSIS — Z0000 Encounter for general adult medical examination without abnormal findings: Secondary | ICD-10-CM

## 2021-04-30 DIAGNOSIS — I10 Essential (primary) hypertension: Secondary | ICD-10-CM

## 2021-04-30 DIAGNOSIS — K760 Fatty (change of) liver, not elsewhere classified: Secondary | ICD-10-CM

## 2021-04-30 NOTE — Progress Notes (Signed)
Annual appointment labs ordered.   PSA done due to prior elevation - but if seeing urology can cancel  If he requests additional labs advise him we can discuss these at our appointment

## 2021-05-01 ENCOUNTER — Ambulatory Visit (INDEPENDENT_AMBULATORY_CARE_PROVIDER_SITE_OTHER): Payer: 59 | Admitting: *Deleted

## 2021-05-01 DIAGNOSIS — J309 Allergic rhinitis, unspecified: Secondary | ICD-10-CM | POA: Diagnosis not present

## 2021-05-07 ENCOUNTER — Ambulatory Visit (INDEPENDENT_AMBULATORY_CARE_PROVIDER_SITE_OTHER): Payer: 59 | Admitting: *Deleted

## 2021-05-07 DIAGNOSIS — J309 Allergic rhinitis, unspecified: Secondary | ICD-10-CM

## 2021-05-10 ENCOUNTER — Other Ambulatory Visit: Payer: Self-pay | Admitting: Family Medicine

## 2021-05-10 ENCOUNTER — Other Ambulatory Visit: Payer: Self-pay

## 2021-05-10 ENCOUNTER — Other Ambulatory Visit (INDEPENDENT_AMBULATORY_CARE_PROVIDER_SITE_OTHER): Payer: 59

## 2021-05-10 DIAGNOSIS — R972 Elevated prostate specific antigen [PSA]: Secondary | ICD-10-CM | POA: Diagnosis not present

## 2021-05-10 DIAGNOSIS — Z113 Encounter for screening for infections with a predominantly sexual mode of transmission: Secondary | ICD-10-CM

## 2021-05-10 DIAGNOSIS — I10 Essential (primary) hypertension: Secondary | ICD-10-CM

## 2021-05-10 DIAGNOSIS — K760 Fatty (change of) liver, not elsewhere classified: Secondary | ICD-10-CM

## 2021-05-10 DIAGNOSIS — R69 Illness, unspecified: Secondary | ICD-10-CM | POA: Diagnosis not present

## 2021-05-10 DIAGNOSIS — Z1159 Encounter for screening for other viral diseases: Secondary | ICD-10-CM | POA: Diagnosis not present

## 2021-05-10 DIAGNOSIS — Z114 Encounter for screening for human immunodeficiency virus [HIV]: Secondary | ICD-10-CM | POA: Diagnosis not present

## 2021-05-10 DIAGNOSIS — Z0000 Encounter for general adult medical examination without abnormal findings: Secondary | ICD-10-CM

## 2021-05-10 LAB — COMPREHENSIVE METABOLIC PANEL
ALT: 24 U/L (ref 0–53)
AST: 16 U/L (ref 0–37)
Albumin: 4.2 g/dL (ref 3.5–5.2)
Alkaline Phosphatase: 79 U/L (ref 39–117)
BUN: 13 mg/dL (ref 6–23)
CO2: 31 mEq/L (ref 19–32)
Calcium: 9.1 mg/dL (ref 8.4–10.5)
Chloride: 103 mEq/L (ref 96–112)
Creatinine, Ser: 1.06 mg/dL (ref 0.40–1.50)
GFR: 82.18 mL/min (ref >60.00)
Glucose, Bld: 100 mg/dL — ABNORMAL HIGH (ref 70–99)
Potassium: 4.4 mEq/L (ref 3.5–5.1)
Sodium: 140 mEq/L (ref 135–145)
Total Bilirubin: 0.7 mg/dL (ref 0.2–1.2)
Total Protein: 6.5 g/dL (ref 6.0–8.3)

## 2021-05-10 LAB — LIPID PANEL
Cholesterol: 159 mg/dL (ref 0–200)
HDL: 34.5 mg/dL — ABNORMAL LOW (ref >39.00)
LDL Cholesterol: 105 mg/dL — ABNORMAL HIGH (ref 0–99)
NonHDL: 124.08
Total CHOL/HDL Ratio: 5
Triglycerides: 96 mg/dL (ref 0.0–149.0)
VLDL: 19.2 mg/dL (ref 0.0–40.0)

## 2021-05-10 LAB — PSA: PSA: 3.78 ng/mL (ref 0.10–4.00)

## 2021-05-10 NOTE — Addendum Note (Signed)
Addended by: Alvina Chou on: 05/10/2021 10:48 AM   Modules accepted: Orders

## 2021-05-11 ENCOUNTER — Other Ambulatory Visit: Payer: Self-pay | Admitting: Surgery

## 2021-05-11 LAB — HEPATITIS C ANTIBODY
Hepatitis C Ab: NONREACTIVE
SIGNAL TO CUT-OFF: 0.01 (ref <1.00)

## 2021-05-11 LAB — RPR: RPR Ser Ql: NONREACTIVE

## 2021-05-11 LAB — HIV ANTIBODY (ROUTINE TESTING W REFLEX): HIV 1&2 Ab, 4th Generation: NONREACTIVE

## 2021-05-14 ENCOUNTER — Ambulatory Visit (INDEPENDENT_AMBULATORY_CARE_PROVIDER_SITE_OTHER): Payer: 59

## 2021-05-14 DIAGNOSIS — J309 Allergic rhinitis, unspecified: Secondary | ICD-10-CM

## 2021-05-16 ENCOUNTER — Encounter: Payer: Self-pay | Admitting: Family Medicine

## 2021-05-16 ENCOUNTER — Ambulatory Visit (INDEPENDENT_AMBULATORY_CARE_PROVIDER_SITE_OTHER): Payer: 59 | Admitting: Family Medicine

## 2021-05-16 ENCOUNTER — Other Ambulatory Visit: Payer: Self-pay

## 2021-05-16 VITALS — BP 136/74 | HR 80 | Temp 98.1°F | Ht 71.0 in | Wt 214.0 lb

## 2021-05-16 DIAGNOSIS — Z0000 Encounter for general adult medical examination without abnormal findings: Secondary | ICD-10-CM | POA: Diagnosis not present

## 2021-05-16 NOTE — Progress Notes (Signed)
Annual Exam   Chief Complaint:  Chief Complaint  Patient presents with   Annual Exam    History of Present Illness:  Walter Edwards is a 50 y.o. presents today for annual examination.     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 No  Social History   Tobacco Use  Smoking Status Never  Smokeless Tobacco Former   Social History   Substance and Sexual Activity  Alcohol Use Yes   Comment: on the weekends maybe   Social History   Substance and Sexual Activity  Drug Use Never     Safety The patient wears seatbelts: yes.     The patient feels safe at home and in their relationships: yes.  General Health Dentist in the last year: Yes Eye doctor: yes  Weight Wt Readings from Last 3 Encounters:  05/16/21 214 lb (97.1 kg)  02/27/21 216 lb (98 kg)  01/29/21 218 lb 4 oz (99 kg)   Patient has high BMI  BMI Readings from Last 1 Encounters:  05/16/21 29.85 kg/m     Chronic disease screening Blood pressure monitoring:  BP Readings from Last 3 Encounters:  05/16/21 136/74  02/27/21 120/82  01/29/21 130/78    Lipid Monitoring: Indication for screening: age >35, obesity, diabetes, family hx, CV risk factors.  Lipid screening: Yes  Lab Results  Component Value Date   CHOL 159 05/10/2021   HDL 34.50 (L) 05/10/2021   LDLCALC 105 (H) 05/10/2021   TRIG 96.0 05/10/2021   CHOLHDL 5 05/10/2021     Diabetes Screening: age >53, overweight, family hx, PCOS, hx of gestational diabetes, at risk ethnicity, elevated blood pressure >135/80.  Diabetes Screening screening: Yes  Lab Results  Component Value Date   HGBA1C 5.4 04/27/2020     Prostate Cancer Screening: Not Indicated Age 21-69 yo Shared Decision Making Higher Risk: Older age, African American, Family Hx of  Prostate Cancer - No Benefits: screening may prevent 1.3 deaths from prostate cancer over 13 years per 1000 men screened and prevent 3 metastatic cases per 1000 men screened. Not enough evidence to support more benefit for AA or FMH Harms: False Positive and psychological harms. 15% of me with false positive over a 2 to 4 year period > resulting in biopsy and complications such as pain, hematospermia, infections. Overdiagnosis - increases with age - found that 20-50% of prostate cancer through screening may have never caused any issues. Harms of treatment include - erectile dysfunction, urinary incontinence, and bothersome bowel symptoms.   After discussion he does want to get a PSA checked today.   Inadequate evidence for screening <55 No mortality benefit for screening >70   Lab Results  Component Value Date   PSA1 6.4 (H) 05/19/2020   PSA1 4.6 (H) 10/11/2019   PSA 3.78 05/10/2021       Colon Cancer Screening:  Age 37-75 yo - benefits outweigh the risk. Adults 3-85 yo who have never been screened benefit.  Benefits: 134000 people in 2016 will be diagnosed and 49,000 will die - early detection helps Harms: Complications 2/2 to colonoscopy High Risk (Colonoscopy): genetic disorder (Lynch syndrome or familial adenomatous polyposis), personal hx of IBD, previous adenomatous polyp, or previous colorectal cancer, FamHx start 10 years before the age at diagnosis, increased in males and black  race  Options:  FIT - looks for hemoglobin (blood in the stool) - specific and fairly sensitive - must be done annually Cologuard - looks for DNA and blood - more sensitive - therefore can have more false positives, every 3 years Colonoscopy - every 10 years if normal - sedation, bowl prep, must have someone drive you  Shared decision making and the patient had decided to do colonoscopy due in 2030 .   Social History   Tobacco Use  Smoking Status Never  Smokeless Tobacco Former    Lung Cancer  Screening (Ages 52-80): not applicable     Past Medical History:  Diagnosis Date   Allergy    Gastritis    GERD (gastroesophageal reflux disease)    Hyperlipidemia    Hypertension    Inguinal hernia    Internal hemorrhoids    Kidney stones    Low HDL (under 40)    Mild depression (HCC)    Otitis media    Seasonal rhinitis     Past Surgical History:  Procedure Laterality Date   BACK SURGERY     x3   CHOLECYSTECTOMY  06/02/2020   CHOLECYSTECTOMY N/A 06/02/2020   Procedure: LAPAROSCOPIC CHOLECYSTECTOMY;  Surgeon: [name], MD;  Location: [facility] OR;  Service: General;  Laterality: N/A;   COLONOSCOPY     ENDOSCOPIC PLANTAR FASCIOTOMY  05/23/2020    Prior to Admission medications   Medication Sig Start Date End Date Taking? Authorizing Provider  amLODipine (NORVASC) 10 MG tablet TAKE 1 TABLET BY MOUTH ONCE A DAY 04/27/21  Yes Lynnda Child, MD  cyclobenzaprine (FLEXERIL) 10 MG tablet Take 10 mg by mouth daily as needed (Back pain).  04/04/20  Yes [provider]  diphenhydrAMINE (BENADRYL) 25 MG tablet Take 25 mg by mouth See admin instructions. Takes with hydrocodone to prevent itching as needed   Yes [provider]  docusate sodium (COLACE) 250 MG capsule Take 250 mg by mouth daily after lunch.    Yes [provider]  EPINEPHrine 0.3 mg/0.3 mL IJ SOAJ injection Inject 0.3 mg into the muscle as needed. 01/04/21  Yes [provider]  fexofenadine (ALLEGRA) 180 MG tablet Take 1 tablet (180 mg total) by mouth daily. 03/29/20  Yes Lynnda Child, MD  HYDROcodone-acetaminophen [facility]) 10-325 MG tablet  03/08/21  Yes [provider]  ibuprofen (ADVIL) 800 MG tablet Take 800 mg by mouth 3 (three) times daily. 11/14/20  Yes [provider]  montelukast (SINGULAIR) 10 MG tablet TAKE 1 TABLET BY MOUTH EVERY MORNING FORALLERGIES 06/29/20  Yes Lynnda Child, MD  Multiple Vitamin (MULTIVITAMIN) tablet Take 1 tablet by mouth daily.   Yes  [provider]  pantoprazole (PROTONIX) 40 MG tablet TAKE 1 TABLET BY MOUTH ONCE DAILY 07/21/20  Yes Lynnda Child, MD  polyethylene glycol powder (GLYCOLAX/MIRALAX) 17 GM/SCOOP powder Take 1 Scoop by mouth at bedtime. Clear lax   Yes [provider]  triamcinolone (NASACORT) 55 MCG/ACT AERO nasal inhaler Place 2 sprays into the nose daily. 01/04/21  Yes Padgett, Pilar Grammes, MD  Vit C-Quercet-Bioflv-Bromelain [phone] MG CAPS Take 2 Doses by mouth 3 (three) times daily between meals. 11/21/20  Yes Pricilla Handler, MD  Wheat Dextrin (BENEFIBER PO) 1 Tablespoon 01/26/20  Yes [provider]    Allergies  Allergen Reactions   Hydrocodone Itching   Tessalon Perles [Benzonatate] Rash    itching     Social History   Socioeconomic History   Marital status: Single  Spouse name: Not on file   Number of children: 1   Years of education: [employer]   Highest education level: Not on file  Occupational History   Not on file  Tobacco Use   Smoking status: Never   Smokeless tobacco: Former  Building services engineerVaping Use   Vaping Use: Never used  Substance and Sexual Activity   Alcohol use: Yes    Comment: on the weekends maybe   Drug use: Never   Sexual activity: Yes    Birth control/protection: Condom  Other Topics Concern   Not on file  Social History Narrative   11/11/19   From: the area   Living: alone   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[employer] [place]      Family: 1 daughter - Ladona Ridgelaylor - 2003      Enjoys: relax, outdoor activity, hiking, concerts      Exercise: hiking, 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, 9-10K steps per day   Diet: had done low carb, but not doing great now, meat/veggies/fruit      Safety   Seat belts: Yes    Guns: Yes  and secure   Safe in relationships: Yes    Social Determinants of Corporate investment bankerHealth   Financial Resource Strain: Not on file  Food Insecurity: Not on file  Transportation Needs: Not on file  Physical Activity: Not  on file  Stress: Not on file  Social Connections: Not on file  Intimate Partner Violence: Not on file    Family History  Problem Relation Age of Onset   Diabetes Mother    Anxiety disorder Mother    Hypertension Mother    Diabetes Father    Hypertension Father    Obesity Sister    Hypertension Sister    Colon cancer Neg Hx    Pancreatic cancer Neg Hx    Esophageal cancer Neg Hx    Liver cancer Neg Hx     Review of Systems  Constitutional:  Negative for chills and fever.  HENT:  Negative for congestion and sore throat.   Eyes:  Negative for blurred vision and double vision.  Respiratory:  Negative for shortness of breath.   Cardiovascular:  Negative for chest pain.  Gastrointestinal:  Negative for heartburn, nausea and vomiting.  Genitourinary: Negative.   Musculoskeletal: Negative.  Negative for myalgias.  Skin:  Negative for rash.  Neurological:  Negative for dizziness and headaches.  Endo/Heme/Allergies:  Does not bruise/bleed easily.  Psychiatric/Behavioral:  Negative for depression. The patient is not nervous/anxious.     Physical Exam BP 136/74   Pulse 80   Temp 98.1 F (36.7 C) (Temporal)   Ht 5\' 11"  (1.803 m)   Wt 214 lb (97.1 kg)   SpO2 98%   BMI 29.85 kg/m    BP Readings from Last 3 Encounters:  05/16/21 136/74  02/27/21 120/82  01/29/21 130/78      Physical Exam Constitutional:      General: He is not in acute distress.    Appearance: He is well-developed. He is not diaphoretic.  HENT:     Head: Normocephalic and atraumatic.     Right Ear: Tympanic membrane and ear canal normal.     Left Ear: Tympanic membrane and ear canal normal.     Nose: Nose normal.     Mouth/Throat:     Pharynx: Uvula midline.  Eyes:     General: No scleral icterus.    Conjunctiva/sclera: Conjunctivae normal.     Pupils:  Pupils are equal, round, and reactive to light.  Cardiovascular:     Rate and Rhythm: Normal rate and regular rhythm.     Heart sounds: Normal  heart sounds. No murmur heard. Pulmonary:     Effort: Pulmonary effort is normal. No respiratory distress.     Breath sounds: Normal breath sounds. No wheezing.  Abdominal:     General: Bowel sounds are normal. There is no distension.     Palpations: Abdomen is soft. There is no mass.     Tenderness: There is no abdominal tenderness. There is no guarding.  Musculoskeletal:        General: Normal range of motion.     Cervical back: Normal range of motion and neck supple.  Lymphadenopathy:     Cervical: No cervical adenopathy.  Skin:    General: Skin is warm and dry.     Capillary Refill: Capillary refill takes less than 2 seconds.  Neurological:     Mental Status: He is alert and oriented to person, place, and time.       Results:  PHQ-9:  Flowsheet Row Office Visit from 04/27/2020 in [facility]  PHQ-9 Total Score 2         Assessment: 50 y.o. here for routine annual physical examination.  Plan: Problem List Items Addressed This Visit   None Visit Diagnoses     Annual physical exam    -  Primary       Screening: -- Blood pressure screen  controlled -- cholesterol screening:  elevated -- Weight screening: overweight: continue to monitor -- Diabetes Screening: not due for screening -- Nutrition: normal - Encouraged healthy diet  The 10-year ASCVD risk score Denman George DC Jr., et al., 2013) is: 4.4%   Values used to calculate the score:     Age: 8 years     Sex: Male     Is Non-Hispanic African American: No     Diabetic: No     Tobacco smoker: No     Systolic Blood Pressure: 136 mmHg     Is BP treated: Yes     HDL Cholesterol: 34.5 mg/dL     Total Cholesterol: 159 mg/dL  -- ASA 81 mg discussed if CVD risk >10% age 37-59 and willing to take for 10 years -- Statin therapy for Age 34-75 with CVD risk >7.5%  Psych -- Depression screening (PHQ-9): negative  Safety -- tobacco screening: not using -- alcohol screening:  low-risk  usage. -- no evidence of domestic violence or intimate partner violence.  Cancer Screening -- Prostate (age 70-69)  normal -- Colon (age 26-75)  up to date -- Lung not indicated   Immunizations Immunization History  Administered Date(s) Administered   Influenza,inj,Quad PF,6+ Mos 07/22/2019, 08/24/2020   Influenza-Unspecified 08/24/2008, 08/31/2009, 08/08/2010, 08/27/2012, 08/20/2017   PFIZER(Purple Top)SARS-COV-2 Vaccination 11/11/2019, 12/02/2019, 10/30/2020   PPD Test 06/30/2014, 08/03/2015, 07/04/2016, 07/08/2017, 06/03/2018, 06/03/2018   Td 07/30/2010   Tdap 04/27/2020    -- flu vaccine up to date -- TDAP q10 years up to date -- Covid-19 Vaccine up to date  Encouraged regular vision and dental screening. Encouraged healthy exercise and diet.   Lynnda Child

## 2021-05-16 NOTE — Patient Instructions (Signed)
Preventive Care 40-50 Years Old, Male Preventive care refers to lifestyle choices and visits with your health care provider that can promote health and wellness. This includes: A yearly physical exam. This is also called an annual wellness visit. Regular dental and eye exams. Immunizations. Screening for certain conditions. Healthy lifestyle choices, such as: Eating a healthy diet. Getting regular exercise. Not using drugs or products that contain nicotine and tobacco. Limiting alcohol use. What can I expect for my preventive care visit? Physical exam Your health care provider will check your: Height and weight. These may be used to calculate your BMI (body mass index). BMI is a measurement that tells if you are at a healthy weight. Heart rate and blood pressure. Body temperature. Skin for abnormal spots. Counseling Your health care provider may ask you questions about your: Past medical problems. Family's medical history. Alcohol, tobacco, and drug use. Emotional well-being. Home life and relationship well-being. Sexual activity. Diet, exercise, and sleep habits. Work and work environment. Access to firearms. What immunizations do I need?  Vaccines are usually given at various ages, according to a schedule. Your health care provider will recommend vaccines for you based on your age, medicalhistory, and lifestyle or other factors, such as travel or where you work. What tests do I need? Blood tests Lipid and cholesterol levels. These may be checked every 5 years, or more often if you are over 50 years old. Hepatitis C test. Hepatitis B test. Screening Lung cancer screening. You may have this screening every year starting at age 55 if you have a 30-pack-year history of smoking and currently smoke or have quit within the past 15 years. Prostate cancer screening. Recommendations will vary depending on your family history and other risks. Genital exam to check for testicular cancer  or hernias. Colorectal cancer screening. All adults should have this screening starting at age 50 and continuing until age 75. Your health care provider may recommend screening at age 45 if you are at increased risk. You will have tests every 1-10 years, depending on your results and the type of screening test. Diabetes screening. This is done by checking your blood sugar (glucose) after you have not eaten for a while (fasting). You may have this done every 1-3 years. STD (sexually transmitted disease) testing, if you are at risk. Follow these instructions at home: Eating and drinking  Eat a diet that includes fresh fruits and vegetables, whole grains, lean protein, and low-fat dairy products. Take vitamin and mineral supplements as recommended by your health care provider. Do not drink alcohol if your health care provider tells you not to drink. If you drink alcohol: Limit how much you have to 0-2 drinks a day. Be aware of how much alcohol is in your drink. In the [place] one drink equals one 12 oz bottle of beer (355 mL), one 5 oz glass of wine (148 mL), or one 1 oz glass of hard liquor (44 mL).  Lifestyle Take daily care of your teeth and gums. Brush your teeth every morning and night with fluoride toothpaste. Floss one time each day. Stay active. Exercise for at least 30 minutes 5 or more days each week. Do not use any products that contain nicotine or tobacco, such as cigarettes, e-cigarettes, and chewing tobacco. If you need help quitting, ask your health care provider. Do not use drugs. If you are sexually active, practice safe sex. Use a condom or other form of protection to prevent STIs (sexually transmitted infections). If told by   your health care provider, take low-dose aspirin daily starting at age 50. Find healthy ways to cope with stress, such as: Meditation, yoga, or listening to music. Journaling. Talking to a trusted person. Spending time with friends and  family. Safety Always wear your seat belt while driving or riding in a vehicle. Do not drive: If you have been drinking alcohol. Do not ride with someone who has been drinking. When you are tired or distracted. While texting. Wear a helmet and other protective equipment during sports activities. If you have firearms in your house, make sure you follow all gun safety procedures. What's next? Go to your health care provider once a year for an annual wellness visit. Ask your health care provider how often you should have your eyes and teeth checked. Stay up to date on all vaccines. This information is not intended to replace advice given to you by your health care provider. Make sure you discuss any questions you have with your healthcare provider. Document Revised: 07/06/2019 Document Reviewed: 10/01/2018 Elsevier Patient Education  2022 Elsevier Inc.  

## 2021-05-22 ENCOUNTER — Ambulatory Visit (INDEPENDENT_AMBULATORY_CARE_PROVIDER_SITE_OTHER): Payer: 59

## 2021-05-22 DIAGNOSIS — J309 Allergic rhinitis, unspecified: Secondary | ICD-10-CM | POA: Diagnosis not present

## 2021-05-25 ENCOUNTER — Other Ambulatory Visit: Payer: Self-pay

## 2021-05-25 DIAGNOSIS — I10 Essential (primary) hypertension: Secondary | ICD-10-CM

## 2021-05-25 MED ORDER — AMLODIPINE BESYLATE 10 MG PO TABS: 10.0000 mg | ORAL_TABLET | Freq: Every day | ORAL | 3 refills | Status: AC

## 2021-05-28 ENCOUNTER — Ambulatory Visit (INDEPENDENT_AMBULATORY_CARE_PROVIDER_SITE_OTHER): Payer: 59 | Admitting: *Deleted

## 2021-05-28 DIAGNOSIS — J309 Allergic rhinitis, unspecified: Secondary | ICD-10-CM

## 2021-06-06 ENCOUNTER — Ambulatory Visit (INDEPENDENT_AMBULATORY_CARE_PROVIDER_SITE_OTHER): Payer: 59

## 2021-06-06 DIAGNOSIS — J309 Allergic rhinitis, unspecified: Secondary | ICD-10-CM

## 2021-06-12 ENCOUNTER — Ambulatory Visit (INDEPENDENT_AMBULATORY_CARE_PROVIDER_SITE_OTHER): Payer: 59 | Admitting: *Deleted

## 2021-06-12 DIAGNOSIS — J309 Allergic rhinitis, unspecified: Secondary | ICD-10-CM

## 2021-06-18 ENCOUNTER — Ambulatory Visit (INDEPENDENT_AMBULATORY_CARE_PROVIDER_SITE_OTHER): Payer: 59

## 2021-06-18 DIAGNOSIS — J309 Allergic rhinitis, unspecified: Secondary | ICD-10-CM

## 2021-06-27 ENCOUNTER — Ambulatory Visit (INDEPENDENT_AMBULATORY_CARE_PROVIDER_SITE_OTHER): Payer: 59

## 2021-06-27 DIAGNOSIS — J309 Allergic rhinitis, unspecified: Secondary | ICD-10-CM

## 2021-07-03 ENCOUNTER — Ambulatory Visit (INDEPENDENT_AMBULATORY_CARE_PROVIDER_SITE_OTHER): Payer: 59 | Admitting: *Deleted

## 2021-07-03 DIAGNOSIS — J309 Allergic rhinitis, unspecified: Secondary | ICD-10-CM | POA: Diagnosis not present

## 2021-07-05 ENCOUNTER — Other Ambulatory Visit: Payer: Self-pay | Admitting: Family Medicine

## 2021-07-09 ENCOUNTER — Ambulatory Visit (INDEPENDENT_AMBULATORY_CARE_PROVIDER_SITE_OTHER): Payer: 59 | Admitting: *Deleted

## 2021-07-09 DIAGNOSIS — J309 Allergic rhinitis, unspecified: Secondary | ICD-10-CM | POA: Diagnosis not present

## 2021-07-10 ENCOUNTER — Encounter (HOSPITAL_BASED_OUTPATIENT_CLINIC_OR_DEPARTMENT_OTHER): Payer: Self-pay | Admitting: Surgery

## 2021-07-10 ENCOUNTER — Other Ambulatory Visit: Payer: Self-pay

## 2021-07-16 ENCOUNTER — Ambulatory Visit (INDEPENDENT_AMBULATORY_CARE_PROVIDER_SITE_OTHER): Payer: 59

## 2021-07-16 ENCOUNTER — Encounter (HOSPITAL_BASED_OUTPATIENT_CLINIC_OR_DEPARTMENT_OTHER)
Admission: RE | Admit: 2021-07-16 | Discharge: 2021-07-16 | Disposition: A | Discharge status: Home / Self Care | Payer: 59 | Source: Ambulatory Visit | Attending: Surgery | Admitting: Surgery

## 2021-07-16 DIAGNOSIS — K439 Ventral hernia without obstruction or gangrene: Secondary | ICD-10-CM | POA: Diagnosis not present

## 2021-07-16 DIAGNOSIS — J309 Allergic rhinitis, unspecified: Secondary | ICD-10-CM

## 2021-07-16 MED ORDER — ENSURE PRE-SURGERY PO LIQD
296.0000 mL | Freq: Once | ORAL | Status: AC
  Administered 2021-07-17: 296 mL via ORAL

## 2021-07-16 NOTE — Anesthesia Preprocedure Evaluation (Addendum)
Anesthesia Evaluation  Patient identified by MRN, date of birth, ID band Patient awake    Reviewed: Allergy & Precautions, Patient's Chart, lab work & pertinent test results  Airway Mallampati: II  TM Distance: >3 FB Neck ROM: Full    Dental  (+) Chipped,    Pulmonary neg pulmonary ROS,    Pulmonary exam normal        Cardiovascular hypertension, Pt. on medications  Rhythm:Regular Rate:Normal  HLD   Neuro/Psych PSYCHIATRIC DISORDERS Depression negative neurological ROS     GI/Hepatic Neg liver ROS, GERD  Medicated and Controlled,  Endo/Other  negative endocrine ROS  Renal/GU negative Renal ROS  negative genitourinary   Musculoskeletal  (+) Arthritis , narcotic dependent  Abdominal (+)  Abdomen: soft.    Peds  Hematology negative hematology ROS (+)   Anesthesia Other Findings   Reproductive/Obstetrics                           Anesthesia Physical Anesthesia Plan  ASA: 2  Anesthesia Plan: General   Post-op Pain Management:    Induction: Intravenous  PONV Risk Score and Plan: 2 and Midazolam, Dexamethasone and Ondansetron  Airway Management Planned: Oral ETT  Additional Equipment: None  Intra-op Plan:   Post-operative Plan: Extubation in OR  Informed Consent: I have reviewed the patients History and Physical, chart, labs and discussed the procedure including the risks, benefits and alternatives for the proposed anesthesia with the patient or authorized representative who has indicated his/her understanding and acceptance.     Dental advisory given  Plan Discussed with: CRNA  Anesthesia Plan Comments:        Anesthesia Quick Evaluation

## 2021-07-16 NOTE — Progress Notes (Signed)

## 2021-07-16 NOTE — H&P (Signed)
PROVIDER:  Wayne Both, MD   MRN: [id number] DOB: 12/19/1970  Subjective    Chief Complaint: No chief complaint on file.       History of Present Illness: He is here for a long-term follow-up.  I performed a laparoscopic cholecystectomy on him last year for chronic cholecystitis.  He has continued to have some abdominal pain mostly at his upper midline toward the right side of the abdomen.  He is a IT sales professional.  He is occasionally noticed a bulge which will pop out.  He had a CT scan prior to the cholecystectomy and another one after.  This suggested a small umbilical hernia but no other fascial defects.  There were also questionable inguinal hernias.  He has never noticed any bulge at his umbilicus or at the groins.  His area of concern is higher up.  He has been having severe constipation intermittently as well.       Review of Systems: A complete review of systems was obtained from the patient.  I have reviewed this information and discussed as appropriate with the patient.  See HPI as well for other ROS.   ROS      Medical History: No past medical history on file.   There is no problem list on file for this patient.     No past surgical history on file. lap chole   Not on File   No current outpatient medications on file prior to visit.    No current facility-administered medications on file prior to visit.      No family history on file.    Social History       Tobacco Use  Smoking Status Not on file  Smokeless Tobacco Not on file      Social History        Socioeconomic History   Marital status: Single      Objective:      There were no vitals filed for this visit.  There is no height or weight on file to calculate BMI.   Physical Exam    He appears well on exam.   With him both lying and standing, there is no hernia or fascial defect at the umbilicus.  There is a questionable fascial defect several inches above the umbilicus which is the  area of his tenderness.  There are no obvious inguinal hernias on physical examination either.  Lungs clear  CV RRR  Skin without rash     Labs, Imaging and Diagnostic Testing:         Assessment and Plan:  Diagnoses and all orders for this visit:   Ventral hernia without obstruction or gangrene       At this point, we had a long discussion regarding his symptoms.  We discussed a diagnostic laparoscopy versus an open hernia repair.  There is definitely either a fascial defect or some abnormality at the midline which is not evident on the CT scan but is evident on physical examination and is the area of tenderness.  I recommend that I do an open procedure there at this area which is likely a ventral hernia to see if this will relieve his discomfort and symptoms. Discussed the risk of surgery which includes but is not limited to bleeding, infection, the chance this may not resolve any of his symptoms, the use of mesh of a hernia is present, the need for further procedures, postoperative recovery, etc.  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

## 2021-07-17 ENCOUNTER — Ambulatory Visit (HOSPITAL_BASED_OUTPATIENT_CLINIC_OR_DEPARTMENT_OTHER)
Admission: RE | Admit: 2021-07-17 | Discharge: 2021-07-17 | Disposition: A | Discharge status: Home / Self Care | Payer: 59 | Source: Ambulatory Visit | Attending: Surgery | Admitting: Surgery

## 2021-07-17 ENCOUNTER — Ambulatory Visit (HOSPITAL_BASED_OUTPATIENT_CLINIC_OR_DEPARTMENT_OTHER): Payer: 59 | Admitting: Anesthesiology

## 2021-07-17 ENCOUNTER — Encounter (HOSPITAL_BASED_OUTPATIENT_CLINIC_OR_DEPARTMENT_OTHER)
Admission: RE | Disposition: A | Discharge status: Home / Self Care | Payer: Self-pay | Source: Ambulatory Visit | Attending: Surgery

## 2021-07-17 ENCOUNTER — Encounter (HOSPITAL_BASED_OUTPATIENT_CLINIC_OR_DEPARTMENT_OTHER): Payer: Self-pay | Admitting: Surgery

## 2021-07-17 ENCOUNTER — Other Ambulatory Visit: Payer: Self-pay

## 2021-07-17 DIAGNOSIS — I10 Essential (primary) hypertension: Secondary | ICD-10-CM | POA: Diagnosis not present

## 2021-07-17 DIAGNOSIS — K439 Ventral hernia without obstruction or gangrene: Secondary | ICD-10-CM | POA: Insufficient documentation

## 2021-07-17 DIAGNOSIS — J302 Other seasonal allergic rhinitis: Secondary | ICD-10-CM | POA: Diagnosis not present

## 2021-07-17 DIAGNOSIS — K219 Gastro-esophageal reflux disease without esophagitis: Secondary | ICD-10-CM | POA: Diagnosis not present

## 2021-07-17 HISTORY — PX: VENTRAL HERNIA REPAIR: SHX424

## 2021-07-17 HISTORY — DX: Other chronic pain: G89.29

## 2021-07-17 HISTORY — DX: Personal history of urinary calculi: Z87.442

## 2021-07-17 SURGERY — REPAIR, HERNIA, VENTRAL
Anesthesia: General | Site: Abdomen

## 2021-07-17 MED ORDER — ACETAMINOPHEN 500 MG PO TABS
1000.0000 mg | ORAL_TABLET | Freq: Once | ORAL | Status: AC
  Administered 2021-07-17: 1000 mg via ORAL

## 2021-07-17 MED ORDER — DEXAMETHASONE SODIUM PHOSPHATE 10 MG/ML IJ SOLN
INTRAMUSCULAR | Status: AC
  Filled 2021-07-17: qty 1

## 2021-07-17 MED ORDER — ONDANSETRON HCL 4 MG/2ML IJ SOLN
INTRAMUSCULAR | Status: DC | PRN
  Administered 2021-07-17: 4 mg via INTRAVENOUS

## 2021-07-17 MED ORDER — LIDOCAINE HCL (PF) 2 % IJ SOLN
INTRAMUSCULAR | Status: DC | PRN
  Administered 2021-07-17: 60 mg
  Administered 2021-07-17: 40 mg

## 2021-07-17 MED ORDER — LACTATED RINGERS IV SOLN: INTRAVENOUS | Status: DC

## 2021-07-17 MED ORDER — CEFAZOLIN SODIUM-DEXTROSE 2-4 GM/100ML-% IV SOLN
2.0000 g | INTRAVENOUS | Status: AC
  Administered 2021-07-17: 2 g via INTRAVENOUS

## 2021-07-17 MED ORDER — FENTANYL CITRATE (PF) 100 MCG/2ML IJ SOLN
INTRAMUSCULAR | Status: AC
  Filled 2021-07-17: qty 2

## 2021-07-17 MED ORDER — ESMOLOL HCL 100 MG/10ML IV SOLN
INTRAVENOUS | Status: AC
  Filled 2021-07-17: qty 10

## 2021-07-17 MED ORDER — DEXAMETHASONE SODIUM PHOSPHATE 4 MG/ML IJ SOLN
INTRAMUSCULAR | Status: DC | PRN
  Administered 2021-07-17: 10 mg via INTRAVENOUS

## 2021-07-17 MED ORDER — BUPIVACAINE-EPINEPHRINE 0.5% -1:200000 IJ SOLN
INTRAMUSCULAR | Status: DC | PRN
  Administered 2021-07-17: 15 mL

## 2021-07-17 MED ORDER — CHLORHEXIDINE GLUCONATE CLOTH 2 % EX PADS: 6.0000 | MEDICATED_PAD | Freq: Once | CUTANEOUS | Status: DC

## 2021-07-17 MED ORDER — OXYCODONE HCL 5 MG PO TABS: 5.0000 mg | ORAL_TABLET | Freq: Four times a day (QID) | ORAL | 0 refills | Status: DC | PRN

## 2021-07-17 MED ORDER — ESMOLOL HCL 100 MG/10ML IV SOLN
INTRAVENOUS | Status: DC | PRN
  Administered 2021-07-17 (×2): 30 mg via INTRAVENOUS

## 2021-07-17 MED ORDER — MIDAZOLAM HCL 5 MG/5ML IJ SOLN
INTRAMUSCULAR | Status: DC | PRN
  Administered 2021-07-17: 2 mg via INTRAVENOUS

## 2021-07-17 MED ORDER — FENTANYL CITRATE (PF) 100 MCG/2ML IJ SOLN: 25.0000 ug | INTRAMUSCULAR | Status: DC | PRN

## 2021-07-17 MED ORDER — DEXMEDETOMIDINE (PRECEDEX) IN NS 20 MCG/5ML (4 MCG/ML) IV SYRINGE
PREFILLED_SYRINGE | INTRAVENOUS | Status: DC | PRN
  Administered 2021-07-17 (×4): 4 ug via INTRAVENOUS

## 2021-07-17 MED ORDER — LIDOCAINE 2% (20 MG/ML) 5 ML SYRINGE
INTRAMUSCULAR | Status: AC
  Filled 2021-07-17: qty 5

## 2021-07-17 MED ORDER — ONDANSETRON HCL 4 MG/2ML IJ SOLN
INTRAMUSCULAR | Status: AC
  Filled 2021-07-17: qty 2

## 2021-07-17 MED ORDER — ROCURONIUM BROMIDE 10 MG/ML (PF) SYRINGE
PREFILLED_SYRINGE | INTRAVENOUS | Status: AC
  Filled 2021-07-17: qty 10

## 2021-07-17 MED ORDER — MIDAZOLAM HCL 2 MG/2ML IJ SOLN
INTRAMUSCULAR | Status: AC
  Filled 2021-07-17: qty 2

## 2021-07-17 MED ORDER — PROPOFOL 10 MG/ML IV BOLUS
INTRAVENOUS | Status: DC | PRN
  Administered 2021-07-17: 180 mg via INTRAVENOUS
  Administered 2021-07-17: 20 mg via INTRAVENOUS

## 2021-07-17 MED ORDER — KETOROLAC TROMETHAMINE 30 MG/ML IJ SOLN
INTRAMUSCULAR | Status: AC
  Filled 2021-07-17: qty 1

## 2021-07-17 MED ORDER — CEFAZOLIN SODIUM-DEXTROSE 2-4 GM/100ML-% IV SOLN
INTRAVENOUS | Status: AC
  Filled 2021-07-17: qty 100

## 2021-07-17 MED ORDER — ROCURONIUM BROMIDE 100 MG/10ML IV SOLN
INTRAVENOUS | Status: DC | PRN
  Administered 2021-07-17: 50 mg via INTRAVENOUS

## 2021-07-17 MED ORDER — DEXMEDETOMIDINE (PRECEDEX) IN NS 20 MCG/5ML (4 MCG/ML) IV SYRINGE
PREFILLED_SYRINGE | INTRAVENOUS | Status: AC
  Filled 2021-07-17: qty 5

## 2021-07-17 MED ORDER — FENTANYL CITRATE (PF) 100 MCG/2ML IJ SOLN
INTRAMUSCULAR | Status: DC | PRN
  Administered 2021-07-17: 100 ug via INTRAVENOUS

## 2021-07-17 MED ORDER — SUGAMMADEX SODIUM 200 MG/2ML IV SOLN
INTRAVENOUS | Status: DC | PRN
  Administered 2021-07-17: 200 mg via INTRAVENOUS

## 2021-07-17 MED ORDER — KETOROLAC TROMETHAMINE 30 MG/ML IJ SOLN
INTRAMUSCULAR | Status: DC | PRN
  Administered 2021-07-17: 30 mg via INTRAVENOUS

## 2021-07-17 MED ORDER — ACETAMINOPHEN 500 MG PO TABS
ORAL_TABLET | ORAL | Status: AC
  Filled 2021-07-17: qty 2

## 2021-07-17 MED ORDER — PROPOFOL 10 MG/ML IV BOLUS
INTRAVENOUS | Status: AC
  Filled 2021-07-17: qty 20

## 2021-07-17 SURGICAL SUPPLY — 35 items
ADH SKN CLS APL DERMABOND .7 (GAUZE/BANDAGES/DRESSINGS) ×2
APL PRP STRL LF DISP 70% ISPRP (MISCELLANEOUS) ×1
BLADE CLIPPER SURG (BLADE) ×1 IMPLANT
BLADE SURG 15 STRL LF DISP TIS (BLADE) ×1 IMPLANT
BLADE SURG 15 STRL SS (BLADE) ×2
CHLORAPREP W/TINT 26 (MISCELLANEOUS) ×2 IMPLANT
CLEANER CAUTERY TIP 5X5 PAD (MISCELLANEOUS) ×1 IMPLANT
COVER BACK TABLE 60X90IN (DRAPES) ×2 IMPLANT
COVER MAYO STAND STRL (DRAPES) ×2 IMPLANT
DERMABOND ADVANCED (GAUZE/BANDAGES/DRESSINGS) ×2
DERMABOND ADVANCED .7 DNX12 (GAUZE/BANDAGES/DRESSINGS) ×2 IMPLANT
DRAPE LAPAROTOMY 100X72 PEDS (DRAPES) ×2 IMPLANT
DRAPE UTILITY XL STRL (DRAPES) ×2 IMPLANT
ELECT REM PT RETURN 9FT ADLT (ELECTROSURGICAL) ×2
ELECTRODE REM PT RTRN 9FT ADLT (ELECTROSURGICAL) ×1 IMPLANT
GLOVE SURG SIGNA 7.5 PF LTX (GLOVE) ×2 IMPLANT
GLOVE SURG UNDER POLY LF SZ7 (GLOVE) ×1 IMPLANT
GOWN STRL REUS W/ TWL LRG LVL3 (GOWN DISPOSABLE) ×1 IMPLANT
GOWN STRL REUS W/ TWL XL LVL3 (GOWN DISPOSABLE) ×1 IMPLANT
GOWN STRL REUS W/TWL LRG LVL3 (GOWN DISPOSABLE) ×2
GOWN STRL REUS W/TWL XL LVL3 (GOWN DISPOSABLE) ×2
NDL HYPO 25X1 1.5 SAFETY (NEEDLE) ×1 IMPLANT
NEEDLE HYPO 25X1 1.5 SAFETY (NEEDLE) ×2 IMPLANT
NS IRRIG 1000ML POUR BTL (IV SOLUTION) IMPLANT
PACK BASIN DAY SURGERY FS (CUSTOM PROCEDURE TRAY) ×2 IMPLANT
PAD CLEANER CAUTERY TIP 5X5 (MISCELLANEOUS) ×1
PENCIL SMOKE EVACUATOR (MISCELLANEOUS) ×2 IMPLANT
SLEEVE SCD COMPRESS KNEE MED (STOCKING) ×2 IMPLANT
SPONGE T-LAP 4X18 ~~LOC~~+RFID (SPONGE) ×2 IMPLANT
SUT MNCRL AB 4-0 PS2 18 (SUTURE) ×2 IMPLANT
SUT NOVA NAB DX-16 0-1 5-0 T12 (SUTURE) ×1 IMPLANT
SUT VIC AB 3-0 SH 27 (SUTURE) ×2
SUT VIC AB 3-0 SH 27X BRD (SUTURE) ×1 IMPLANT
SYR CONTROL 10ML LL (SYRINGE) ×2 IMPLANT
TOWEL GREEN STERILE FF (TOWEL DISPOSABLE) ×2 IMPLANT

## 2021-07-17 NOTE — Transfer of Care (Signed)
Immediate Anesthesia Transfer of Care Note  Patient: Walter Edwards  Procedure(s) Performed: Caren Macadam HERNIA REPAIR (Abdomen)  Patient Location: PACU  Anesthesia Type:General  Level of Consciousness: drowsy  Airway & Oxygen Therapy: Patient Spontanous Breathing and Patient connected to face mask oxygen  Post-op Assessment: Report given to RN and Post -op Vital signs reviewed and stable  Post vital signs: Reviewed and stable  Last Vitals:  Vitals Value Taken Time  BP 123/90 07/17/21 0912  Temp    Pulse 85 07/17/21 0913  Resp 16 07/17/21 0913  SpO2 99 % 07/17/21 0913  Vitals shown include unvalidated device data.  Last Pain:  Vitals:   07/17/21 0650  TempSrc: Oral  PainSc: 0-No pain         Complications: No notable events documented.

## 2021-07-17 NOTE — Op Note (Signed)
[facility] HERNIA REPAIR  Procedure Note  Walter Edwards 07/17/2021   Pre-op Diagnosis: VENTRAL HERNIA     Post-op Diagnosis: same  Procedure(s): VENTRA HERNIA REPAIR  Surgeon(s): [name], MD  Anesthesia: General  Staff:  Circulator: Maryan Rued, RN Scrub Person: Wardell Heath, CST  Estimated Blood Loss: Minimal               Findings: The patient was found to have a small fascial defect in the mid upper abdomen measuring 5 to 7 mm in size.  It was repaired primarily.  Procedure: Patient brought to operating identifies correct patient.  He is placed upon the operating table general anesthesia was induced.  His abdomen was then prepped and draped in the usual sterile fashion.  I made a small vertical incision in the patient's upper midline over the area of tenderness.  I take this down to the fascia.  I explored the surrounding tissue and could only find a very small fascial defect which measured 5 to 7 mm.  I extended it slightly further with the cautery.  There is a small hernia containing preperitoneal fat.  I was able to insert my finger into the abdominal cavity I could feel the fascia all the way down to the umbilicus and up to the xiphoid and could find no other defects.  I could not see any other abnormalities through the small incision.  I then elected to close the small fascial incision with 2 separate figure-of-eight #1 Novafil sutures.  I anesthetized the fascia further with Marcaine.  Hemostasis appeared to be achieved.  I then closed the subcutaneous tissue with interrupted 3-0 Vicryl sutures and closed the skin with a running 4-0 Monocryl.  Dermabond was then applied.  The patient tolerated the procedure well.  All the counts were correct at the end of the procedure.  The patient was then extubated in the operating room and taken in a stable condition to the recovery room.  [name]   Date: 07/17/2021  Time: 9:06 AM

## 2021-07-17 NOTE — Anesthesia Postprocedure Evaluation (Signed)
Anesthesia Post Note  Patient: Walter Edwards  Procedure(s) Performed: VENTRA HERNIA REPAIR (Abdomen)     Patient location during evaluation: PACU Anesthesia Type: General Level of consciousness: awake and alert Pain management: pain level controlled Vital Signs Assessment: post-procedure vital signs reviewed and stable Respiratory status: spontaneous breathing, nonlabored ventilation, respiratory function stable and patient connected to nasal cannula oxygen Cardiovascular status: blood pressure returned to baseline and stable Postop Assessment: no apparent nausea or vomiting Anesthetic complications: no   No notable events documented.  Last Vitals:  Vitals:   07/17/21 0930 07/17/21 0954  BP: 114/80 125/85  Pulse: 84 65  Resp: 15 14  Temp:  36.4 C  SpO2: 97% 96%    Last Pain:  Vitals:   07/17/21 0954  TempSrc:   PainSc: 0-No pain                 Earl Lites P Dorethia Jeanmarie

## 2021-07-17 NOTE — Interval H&P Note (Signed)
History and Physical Interval Note: no change in H and P  07/17/2021 8:14 AM  Walter Edwards  has presented today for surgery, with the diagnosis of VENTRAL HERNIA.  The various methods of treatment have been discussed with the patient and family. After consideration of risks, benefits and other options for treatment, the patient has consented to  Procedure(s): VENTRA HERNIA REPAIR, POSSIBLE MESH (N/A) as a surgical intervention.  The patient's history has been reviewed, patient examined, no change in status, stable for surgery.  I have reviewed the patient's chart and labs.  Questions were answered to the patient's satisfaction.     [name]

## 2021-07-17 NOTE — Anesthesia Procedure Notes (Signed)
Procedure Name: Intubation Date/Time: 07/17/2021 8:36 AM Performed by: Ezequiel Kayser, CRNA Pre-anesthesia Checklist: Patient identified, Emergency Drugs available, Suction available and Patient being monitored Patient Re-evaluated:Patient Re-evaluated prior to induction Oxygen Delivery Method: Circle System Utilized Preoxygenation: Pre-oxygenation with 100% oxygen Induction Type: IV induction Ventilation: Mask ventilation without difficulty Laryngoscope Size: Mac and 4 Grade View: Grade II Tube type: Oral Tube size: 7.0 mm Number of attempts: 1 Airway Equipment and Method: Stylet and Oral airway Placement Confirmation: ETT inserted through vocal cords under direct vision, positive ETCO2 and breath sounds checked- equal and bilateral Tube secured with: Tape Dental Injury: Teeth and Oropharynx as per pre-operative assessment  Comments: Dlx1 by CRNA, grade 4 view. Atraumatic tracheal intubated with anterior pressure by Dr. Gloris Manchester

## 2021-07-17 NOTE — Discharge Instructions (Addendum)
[facility] _______[facility]  UMBILICAL OR INGUINAL HERNIA REPAIR: POST OP INSTRUCTIONS  Always review your discharge instruction sheet given to you by the facility where your surgery was performed. IF YOU HAVE DISABILITY OR FAMILY LEAVE FORMS, YOU MUST BRING THEM TO THE OFFICE FOR PROCESSING.   DO NOT GIVE THEM TO YOUR DOCTOR.  1. A  prescription for pain medication may be given to you upon discharge.  Take your pain medication as prescribed, if needed.  If narcotic pain medicine is not needed, then you may take acetaminophen (Tylenol) or ibuprofen (Advil) as needed. 2. Take your usually prescribed medications unless otherwise directed. If you need a refill on your pain medication, please contact your pharmacy.  They will contact our office to request authorization. Prescriptions will not be filled after 5 pm or on week-ends. 3. You should follow a light diet the first 24 hours after arrival home, such as soup and crackers, etc.  Be sure to include lots of fluids daily.  Resume your normal diet the day after surgery. 4.Most patients will experience some swelling and bruising around the umbilicus or in the groin and scrotum.  Ice packs and reclining will help.  Swelling and bruising can take several days to resolve.  6. It is common to experience some constipation if taking pain medication after surgery.  Increasing fluid intake and taking a stool softener (such as Colace) will usually help or prevent this problem from occurring.  A mild laxative (Milk of Magnesia or Miralax) should be taken according to package directions if there are no bowel movements after 48 hours. 7. Unless discharge instructions indicate otherwise, you may remove your bandages 24-48 hours after surgery, and you may shower at that time.  You may have steri-strips (small skin tapes) in place directly over the incision.  These strips should be left on the skin for 7-10 days.  If your surgeon used skin glue on the  incision, you may shower in 24 hours.  The glue will flake off over the next 2-3 weeks.  Any sutures or staples will be removed at the office during your follow-up visit. 8. ACTIVITIES:  You may resume regular (light) daily activities beginning the next day--such as daily self-care, walking, climbing stairs--gradually increasing activities as tolerated.  You may have sexual intercourse when it is comfortable.  Refrain from any heavy lifting or straining until approved by your doctor.  a.You may drive when you are no longer taking prescription pain medication, you can comfortably wear a seatbelt, and you can safely maneuver your car and apply brakes. b.RETURN TO WORK:   _____________________________________________  9.You should see your doctor in the office for a follow-up appointment approximately 2-3 weeks after your surgery.  Make sure that you call for this appointment within a day or two after you arrive home to insure a convenient appointment time. 10.OTHER INSTRUCTIONS: __OK TO SHOWER STARTING TOMORROW NO LIFTING MORE THAN 15 POUNDS FOR 4 WEEKS ICE PACK, TYLENOL, AND IBUPROFEN ALSO FOR PAIN _______________________    _____________________________________  WHEN TO CALL YOUR DOCTOR: Fever over 101.0 Inability to urinate Nausea and/or vomiting Extreme swelling or bruising Continued bleeding from incision. Increased pain, redness, or drainage from the incision  The clinic staff is available to answer your questions during regular business hours.  Please don't hesitate to call and ask to speak to one of the nurses for clinical concerns.  If you have a medical emergency, go to the nearest emergency room or call 911.  A surgeon from [facility] is always on call at the hospital   [address], [address], [place], [place]  [address] ?  [address] [address], [place], [place]   [address] [phone] ? [phone] ? FAX [phone] Web site: [email or web address]      Post Anesthesia Home Care Instructions  Activity: Get plenty of rest for the remainder of the day. A responsible individual must stay with you for 24 hours following the procedure.  For the next 24 hours, DO NOT: -Drive a car -Advertising copywriter -Drink alcoholic beverages -Take any medication unless instructed by your physician -Make any legal decisions or sign important papers.  Meals: Start with liquid foods such as gelatin or soup. Progress to regular foods as tolerated. Avoid greasy, spicy, heavy foods. If nausea and/or vomiting occur, drink only clear liquids until the nausea and/or vomiting subsides. Call your physician if vomiting continues.  Special Instructions/Symptoms: Your throat may feel dry or sore from the anesthesia or the breathing tube placed in your throat during surgery. If this causes discomfort, gargle with warm salt water. The discomfort should disappear within 24 hours.  Next dose of Tylenol can be taken at 1pm today if needed. Next dose of Ibuprofen/Motrin can be taken at 3pm today if needed.

## 2021-07-19 ENCOUNTER — Encounter (HOSPITAL_BASED_OUTPATIENT_CLINIC_OR_DEPARTMENT_OTHER): Payer: Self-pay | Admitting: Surgery

## 2021-07-23 ENCOUNTER — Telehealth: Payer: Self-pay | Admitting: Family Medicine

## 2021-07-23 NOTE — Telephone Encounter (Signed)
  Encourage patient to contact the pharmacy for refills or they can request refills through [facility]  LAST APPOINTMENT DATE:  Please schedule appointment if longer than 1 year  NEXT APPOINTMENT DATE:  MEDICATION:pantoprazole (PROTONIX) 40 MG tablet  Is the patient out of medication?   PHARMACY:[facility] [place],  [phone]   Let patient know to contact pharmacy at the end of the day to make sure medication is ready.  Please notify patient to allow 48-72 hours to process  CLINICAL FILLS OUT ALL BELOW:   LAST REFILL:  QTY:  REFILL DATE:    OTHER COMMENTS:    Okay for refill?  Please advise

## 2021-07-24 ENCOUNTER — Ambulatory Visit (INDEPENDENT_AMBULATORY_CARE_PROVIDER_SITE_OTHER): Payer: 59 | Admitting: *Deleted

## 2021-07-24 DIAGNOSIS — J309 Allergic rhinitis, unspecified: Secondary | ICD-10-CM | POA: Diagnosis not present

## 2021-07-24 MED ORDER — PANTOPRAZOLE SODIUM 40 MG PO TBEC: 40.0000 mg | DELAYED_RELEASE_TABLET | Freq: Every day | ORAL | 3 refills | Status: DC

## 2021-07-24 NOTE — Telephone Encounter (Signed)
refilled 

## 2021-07-31 ENCOUNTER — Ambulatory Visit (INDEPENDENT_AMBULATORY_CARE_PROVIDER_SITE_OTHER): Payer: 59

## 2021-07-31 DIAGNOSIS — J309 Allergic rhinitis, unspecified: Secondary | ICD-10-CM | POA: Diagnosis not present

## 2021-08-07 ENCOUNTER — Ambulatory Visit: Payer: Self-pay | Admitting: Physician Assistant

## 2021-08-07 ENCOUNTER — Other Ambulatory Visit: Payer: Self-pay

## 2021-08-07 ENCOUNTER — Encounter: Payer: Self-pay | Admitting: Physician Assistant

## 2021-08-07 ENCOUNTER — Ambulatory Visit (INDEPENDENT_AMBULATORY_CARE_PROVIDER_SITE_OTHER): Payer: 59

## 2021-08-07 DIAGNOSIS — J309 Allergic rhinitis, unspecified: Secondary | ICD-10-CM

## 2021-08-07 DIAGNOSIS — Z23 Encounter for immunization: Secondary | ICD-10-CM

## 2021-08-07 DIAGNOSIS — Z0289 Encounter for other administrative examinations: Secondary | ICD-10-CM

## 2021-08-07 LAB — POCT URINALYSIS DIPSTICK
Bilirubin, UA: NEGATIVE
Blood, UA: NEGATIVE
Glucose, UA: NEGATIVE
Ketones, UA: NEGATIVE
Leukocytes, UA: NEGATIVE
Nitrite, UA: NEGATIVE
Protein, UA: NEGATIVE
Spec Grav, UA: 1.02 (ref 1.010–1.025)
Urobilinogen, UA: 0.2 E.U./dL
pH, UA: 7 (ref 5.0–8.0)

## 2021-08-07 NOTE — Progress Notes (Signed)
Pt denies any issues or concerns at this time/CL,RMA ?

## 2021-08-07 NOTE — Progress Notes (Signed)
   Subjective: Annual firefighter exam    Patient ID: Walter Edwards, male    DOB: 01/17/1971, 50 y.o.   MRN: [id number]  HPI Patient presents for annual firefighter exam.  Patient relates right ankle surgery last month.  Still in recovery.   Review of Systems GERD and hypertension    Objective:   Physical Exam No acute distress.  See nursing notes for vital signs. HEENT is unremarkable.  Neck is supple without  adenopathy  or bruits.  Lungs clear to auscultation.  Heart is regular rate and rhythm.  No acute findings on EKG.  Abdomen is abdomen with negative HSM, surgical scar consistent recent history.  Normoactive bowel sounds, soft and mild guarding with palpation secondary to surgery. No obvious upper or lower extremity deformity.  Patient has full and equal range of motion of the upper and lower extremities. No obvious cervical or lumbar spine deformity.  Patient has full and equal range of motion of the cervical lumbar spine. Cranial nerves II through XII are grossly intact.  DTR 2+ without clonus.       Assessment & Plan: Well exam.  Reviewed patient labs and advised to continue previous medications.

## 2021-08-14 ENCOUNTER — Ambulatory Visit (INDEPENDENT_AMBULATORY_CARE_PROVIDER_SITE_OTHER): Payer: 59 | Admitting: *Deleted

## 2021-08-14 DIAGNOSIS — J309 Allergic rhinitis, unspecified: Secondary | ICD-10-CM

## 2021-08-15 ENCOUNTER — Other Ambulatory Visit: Payer: Self-pay

## 2021-08-15 ENCOUNTER — Telehealth: Payer: Self-pay | Admitting: Internal Medicine

## 2021-08-15 NOTE — Telephone Encounter (Signed)
Patient called requesting to see Dr. Rhea Belton asap as he is having severe constipation and pain in his side.  He did have the hernia surgery, which helped for a while, but the pain has returned.  Please call and advise.

## 2021-08-15 NOTE — Telephone Encounter (Signed)
Pt scheduled to see Amy Esterwood PA 08/24/21 at 1:30pm. Pt aware of appt.

## 2021-08-20 ENCOUNTER — Ambulatory Visit (INDEPENDENT_AMBULATORY_CARE_PROVIDER_SITE_OTHER): Payer: 59

## 2021-08-20 DIAGNOSIS — J309 Allergic rhinitis, unspecified: Secondary | ICD-10-CM

## 2021-08-22 DIAGNOSIS — J3081 Allergic rhinitis due to animal (cat) (dog) hair and dander: Secondary | ICD-10-CM | POA: Diagnosis not present

## 2021-08-22 NOTE — Progress Notes (Signed)
VIALS MADE. EXP 08-22-22 

## 2021-08-24 ENCOUNTER — Ambulatory Visit: Payer: 59 | Admitting: Physician Assistant

## 2021-08-28 ENCOUNTER — Ambulatory Visit (INDEPENDENT_AMBULATORY_CARE_PROVIDER_SITE_OTHER): Payer: 59 | Admitting: *Deleted

## 2021-08-28 ENCOUNTER — Ambulatory Visit (INDEPENDENT_AMBULATORY_CARE_PROVIDER_SITE_OTHER): Payer: 59 | Admitting: Physician Assistant

## 2021-08-28 ENCOUNTER — Encounter: Payer: Self-pay | Admitting: Physician Assistant

## 2021-08-28 VITALS — BP 108/80 | HR 88 | Ht 69.5 in | Wt 214.1 lb

## 2021-08-28 DIAGNOSIS — G8929 Other chronic pain: Secondary | ICD-10-CM

## 2021-08-28 DIAGNOSIS — K5909 Other constipation: Secondary | ICD-10-CM

## 2021-08-28 DIAGNOSIS — R1011 Right upper quadrant pain: Secondary | ICD-10-CM

## 2021-08-28 DIAGNOSIS — J309 Allergic rhinitis, unspecified: Secondary | ICD-10-CM

## 2021-08-28 MED ORDER — LUBIPROSTONE 8 MCG PO CAPS: 8.0000 ug | ORAL_CAPSULE | Freq: Two times a day (BID) | ORAL | 2 refills | Status: DC

## 2021-08-28 NOTE — Patient Instructions (Signed)
We have sent the following medications to your pharmacy for you to pick up at your convenience: Amitiza 8 mg twice daily.   If you are age 50 or older, your body mass index should be between 23-30. Your Body mass index is 31.17 kg/m. If this is out of the aforementioned range listed, please consider follow up with your Primary Care Provider.  If you are age 34 or younger, your body mass index should be between 19-25. Your Body mass index is 31.17 kg/m. If this is out of the aformentioned range listed, please consider follow up with your Primary Care Provider.   ________________________________________________________  The [facility] providers would like to encourage you to use [facility] to communicate with providers for non-urgent requests or questions.  Due to long hold times on the telephone, sending your provider a message by [facility] may be a faster and more efficient way to get a response.  Please allow 48 business hours for a response.  Please remember that this is for non-urgent requests.  _______________________________________________________

## 2021-08-28 NOTE — Progress Notes (Signed)
Chief Complaint: Continued right upper quadrant pain and constipation  HPI:    Mr. Walter Edwards a 50 year old Caucasian male with a past medical history as listed below including reflux and umbilical hernia (repaired July 17, 2021), constipation, chronic intermittent mid abdominal pain, gallbladder disease status post cholecystectomy in August 2021, GERD and others, who returns to clinic today for follow-up of his chronic right upper quadrant pain and constipation.    04/16/2019 normal colonoscopy with nonbleeding internal hemorrhoids.    03/15/2020 EGD with normal esophagus, normal stomach and normal duodenum.    02/27/2021 patient seen in clinic initially by Dr. Rhea Belton for second opinion given his right upper quadrant pain off-and-on over several years.  He previously been evaluated multiple imaging tests as well as upper endoscopy and colonoscopy with [facility].  He had also seen Dr. Magnus Ivan who recommended cholecystectomy and that was performed August 2021.  Continue to describe right upper quadrant pain and constipation.  He had had a CT scan in October 2021 which was normal.    Today, the patient tells me that he actually had his hernia repaired on September 27 and his right upper quadrant pain is slightly better.  Tells me that now he feels more of a "heaviness in his right upper quadrant/pressure".  Sometimes it is not there at all, but other times it is there for a while.  Tells me this is definitely worse if he needs to have a bowel movement and does improve after a bowel movement.  Describes feeling this when he is walking around.  Sometimes he can just sit down and drink a glass of water and feel better.  He did try coming off of his laxatives after time of his surgery to see if this "fixed everything", but about a week later his constipation came back so he started back on his stool softeners, MiraLAX and fiber.  Tells me this works most of the time but some days he will have a lot of loose  stools and other days he may not have a stool at all.      Tells me his mother also suffers from chronic constipation and feels like this may be a genetic thing.  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  He just wants to know what else to do.    Denies fever, chills, weight loss or blood in his stool.  Past Medical History:  Diagnosis Date   Allergy    Chronic back pain    Gastritis    GERD (gastroesophageal reflux disease)    History of kidney stones    Hyperlipidemia    Hypertension    Inguinal hernia    Internal hemorrhoids    Low HDL (under 40)    Mild depression    Otitis media    Seasonal rhinitis     Past Surgical History:  Procedure Laterality Date   BACK SURGERY     x3   CHOLECYSTECTOMY  06/02/2020   CHOLECYSTECTOMY N/A 06/02/2020   Procedure: LAPAROSCOPIC CHOLECYSTECTOMY;  Surgeon: [name], MD;  Location: [facility] OR;  Service: General;  Laterality: N/A;   COLONOSCOPY     ENDOSCOPIC PLANTAR FASCIOTOMY  05/23/2020   VENTRAL HERNIA REPAIR N/A 07/17/2021   Procedure: Caren Macadam HERNIA REPAIR;  Surgeon: [name], MD;  Location: [facility];  Service: General;  Laterality: N/A;    Current Outpatient Medications  Medication  Sig Dispense Refill   amLODipine (NORVASC) 10 MG tablet Take 1 tablet (10 mg total) by mouth daily. 90 tablet 3   cyclobenzaprine (FLEXERIL) 10 MG tablet Take 10 mg by mouth daily as needed (Back pain).      diphenhydrAMINE (BENADRYL) 25 MG tablet Take 25 mg by mouth See admin instructions. Takes with hydrocodone to prevent itching as needed     docusate sodium (COLACE) 250 MG capsule Take 500 mg by mouth daily after lunch.     fexofenadine (ALLEGRA) 180 MG tablet Take 1 tablet (180 mg total) by mouth daily. 90 tablet 3   HYDROcodone-acetaminophen (NORCO) 10-325 MG tablet Take by mouth.     ibuprofen (ADVIL) 800 MG tablet Take 800 mg by mouth 3  (three) times daily.     montelukast (SINGULAIR) 10 MG tablet TAKE 1 TABLET BY MOUTH EVERY MORNING FORALLERGIES 90 tablet 3   Multiple Vitamin (MULTIVITAMIN) tablet Take 1 tablet by mouth daily.     pantoprazole (PROTONIX) 40 MG tablet Take 1 tablet (40 mg total) by mouth daily. 90 tablet 3   polyethylene glycol powder (GLYCOLAX/MIRALAX) 17 GM/SCOOP powder Take 1 Scoop by mouth 2 (two) times daily. Clear lax     Sennosides-Docusate Sodium (EQ STOOL SOFTENER/LAXATIVE PO) Take 1 tablet by mouth daily.     triamcinolone (NASACORT) 55 MCG/ACT AERO nasal inhaler Place 2 sprays into the nose daily. 16.5 g 5   Vit C-Quercet-Bioflv-Bromelain 450-250-125-50 MG CAPS Take 2 Doses by mouth 3 (three) times daily between meals. 42 capsule 0   EPINEPHrine 0.3 mg/0.3 mL IJ SOAJ injection Inject 0.3 mg into the muscle as needed. (Patient not taking: Reported on 08/28/2021)     No current facility-administered medications for this visit.    Allergies as of 08/28/2021 - Review Complete 08/28/2021  Allergen Reaction Noted   Hydrocodone Itching 08/13/2019   Tessalon perles [benzonatate] Rash 11/11/2019    Family History  Problem Relation Age of Onset   Diabetes Mother    Anxiety disorder Mother    Hypertension Mother    Diabetes Father    Hypertension Father    Obesity Sister    Hypertension Sister    Colon cancer Neg Hx    Pancreatic cancer Neg Hx    Esophageal cancer Neg Hx    Liver cancer Neg Hx     Social History   Socioeconomic History   Marital status: Single    Spouse name: Not on file   Number of children: 1   Years of education: [employer] education level: Not on file  Occupational History   Not on file  Tobacco Use   Smoking status: Never   Smokeless tobacco: Former  Building services engineer Use: Never used  Substance and Sexual Activity   Alcohol use: Yes    Comment: on the weekends maybe   Drug use: Never   Sexual activity: Yes    Birth control/protection:  Condom  Other Topics Concern   Not on file  Social History Narrative   11/11/19   From: the area   Living: alone   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 [place]      Family: 1 daughter - Ladona Ridgel - 2003      Enjoys: relax, outdoor activity, hiking, concerts      Exercise: hiking, 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, 9-10K steps per day   Diet: had done low carb, but not doing great now, meat/veggies/fruit  Safety   Seat belts: Yes    Guns: Yes  and secure   Safe in relationships: Yes    Social Determinants of Health   Financial Resource Strain: Not on file  Food Insecurity: Not on file  Transportation Needs: Not on file  Physical Activity: Not on file  Stress: Not on file  Social Connections: Not on file  Intimate Partner Violence: Not on file    Review of Systems:    Constitutional: No weight loss, fever or chills Cardiovascular: No chest pain Respiratory: No SOB  Gastrointestinal: See HPI and otherwise negative  Physical Exam:  Vital signs: BP 108/80 (BP Location: Left Arm, Patient Position: Sitting, Cuff Size: Normal)   Pulse 88   Ht 5' 9.5" (1.765 m) Comment: height measured without shoes  Wt 214 lb 2 oz (97.1 kg)   BMI 31.17 kg/m   Constitutional:   Pleasant Caucasian male appears to be in NAD, Well developed, Well nourished, alert and cooperative Respiratory: Respirations even and unlabored. Lungs clear to auscultation bilaterally.   No wheezes, crackles, or rhonchi.  Cardiovascular: Normal S1, S2. No MRG. Regular rate and rhythm. No peripheral edema, cyanosis or pallor.  Gastrointestinal:  Soft, nondistended, nontender. No rebound or guarding. Normal bowel sounds. No appreciable masses or hepatomegaly. Rectal:  Not performed. Marland Kitchen Psychiatric: Oriented to person, place and time. Demonstrates good judgement and reason without abnormal affect or behaviors.  RELEVANT LABS AND IMAGING: CBC    Component Value Date/Time   WBC 10.7 (H) 05/30/2020 1422    RBC 5.36 05/30/2020 1422   HGB 16.5 05/30/2020 1422   HGB 15.5 05/19/2020 0839   HCT 48.6 05/30/2020 1422   HCT 44.3 05/19/2020 0839   PLT 309 05/30/2020 1422   PLT 264 05/19/2020 0839   MCV 90.7 05/30/2020 1422   MCV 88 05/19/2020 0839   MCH 30.8 05/30/2020 1422   MCHC 34.0 05/30/2020 1422   RDW 12.4 05/30/2020 1422   RDW 12.4 05/19/2020 0839   LYMPHSABS 1.1 05/19/2020 0839   EOSABS 0.2 05/19/2020 0839   BASOSABS 0.0 05/19/2020 0839    CMP     Component Value Date/Time   NA 140 05/10/2021 0824   NA 140 05/19/2020 0839   K 4.4 05/10/2021 0824   CL 103 05/10/2021 0824   CO2 31 05/10/2021 0824   GLUCOSE 100 (H) 05/10/2021 0824   BUN 13 05/10/2021 0824   BUN 11 05/19/2020 0839   CREATININE 1.06 05/10/2021 0824   CALCIUM 9.1 05/10/2021 0824   PROT 6.5 05/10/2021 0824   PROT 6.6 05/19/2020 0839   ALBUMIN 4.2 05/10/2021 0824   ALBUMIN 4.2 05/19/2020 0839   AST 16 05/10/2021 0824   ALT 24 05/10/2021 0824   ALKPHOS 79 05/10/2021 0824   BILITOT 0.7 05/10/2021 0824   BILITOT 0.3 05/19/2020 0839   GFRNONAA >60 05/30/2020 1422   GFRAA >60 05/30/2020 1422    Assessment: 1.  Chronic right upper quadrant pain: Previous cholecystectomy which helped with some of the pain, now status post hernia repair 07/17/2021 which is also helped with some of the pain, but it does continue and seems to be related to below 2.  Chronic constipation: Colonoscopy in 2020 was normal, diagnosed with IBS-C in the past, definitely has relief of pain above after having a bowel movement; likely IBS-C  Plan: 1.  Describes using Linzess in the past, unaware of what strength, but this was "way too strong".  Will trial Amitiza 8 mcg twice daily with a  meal.  Prescribed #60 with 3 refills.  We did not have any samples available today.  Discussed with patient that if it is too strong for him to take twice daily he can try once a day or one every other day.  Would recommend he stop all of his other laxatives to  see if this will work for him alone going forward. 2.  Discussed patient's normal colonoscopy in 2020.  This is reassuring that there is no organic cause for his constipation. 3.  Will arrange for patient to come back and see Dr. Rhea Belton in the next 2 to 3 months or sooner if necessary.  Hyacinth Meeker, PA-C [facility] 08/28/2021, 1:59 PM  Cc: Lynnda Child, MD

## 2021-09-03 NOTE — Progress Notes (Signed)
Addendum: Reviewed and agree with assessment and management plan. Mikaela Hilgeman M, MD  

## 2021-09-05 ENCOUNTER — Ambulatory Visit (INDEPENDENT_AMBULATORY_CARE_PROVIDER_SITE_OTHER): Payer: 59

## 2021-09-05 DIAGNOSIS — J309 Allergic rhinitis, unspecified: Secondary | ICD-10-CM

## 2021-09-10 ENCOUNTER — Ambulatory Visit (INDEPENDENT_AMBULATORY_CARE_PROVIDER_SITE_OTHER): Payer: 59 | Admitting: *Deleted

## 2021-09-10 DIAGNOSIS — J309 Allergic rhinitis, unspecified: Secondary | ICD-10-CM

## 2021-09-17 ENCOUNTER — Ambulatory Visit (INDEPENDENT_AMBULATORY_CARE_PROVIDER_SITE_OTHER): Payer: 59

## 2021-09-17 DIAGNOSIS — J309 Allergic rhinitis, unspecified: Secondary | ICD-10-CM

## 2021-09-25 ENCOUNTER — Ambulatory Visit (INDEPENDENT_AMBULATORY_CARE_PROVIDER_SITE_OTHER): Payer: 59

## 2021-09-25 DIAGNOSIS — J309 Allergic rhinitis, unspecified: Secondary | ICD-10-CM

## 2021-10-01 ENCOUNTER — Ambulatory Visit (INDEPENDENT_AMBULATORY_CARE_PROVIDER_SITE_OTHER): Payer: 59

## 2021-10-01 DIAGNOSIS — J309 Allergic rhinitis, unspecified: Secondary | ICD-10-CM | POA: Diagnosis not present

## 2021-10-08 ENCOUNTER — Ambulatory Visit (INDEPENDENT_AMBULATORY_CARE_PROVIDER_SITE_OTHER): Payer: 59

## 2021-10-08 DIAGNOSIS — J309 Allergic rhinitis, unspecified: Secondary | ICD-10-CM | POA: Diagnosis not present

## 2021-10-16 ENCOUNTER — Ambulatory Visit (INDEPENDENT_AMBULATORY_CARE_PROVIDER_SITE_OTHER): Payer: 59 | Admitting: *Deleted

## 2021-10-16 DIAGNOSIS — J309 Allergic rhinitis, unspecified: Secondary | ICD-10-CM | POA: Diagnosis not present

## 2021-10-25 ENCOUNTER — Ambulatory Visit (INDEPENDENT_AMBULATORY_CARE_PROVIDER_SITE_OTHER): Payer: 59

## 2021-10-25 DIAGNOSIS — J309 Allergic rhinitis, unspecified: Secondary | ICD-10-CM | POA: Diagnosis not present

## 2021-10-26 ENCOUNTER — Other Ambulatory Visit: Payer: Self-pay | Admitting: Physician Assistant

## 2021-11-06 ENCOUNTER — Ambulatory Visit (INDEPENDENT_AMBULATORY_CARE_PROVIDER_SITE_OTHER): Payer: 59

## 2021-11-06 DIAGNOSIS — J309 Allergic rhinitis, unspecified: Secondary | ICD-10-CM | POA: Diagnosis not present

## 2021-11-12 ENCOUNTER — Ambulatory Visit (INDEPENDENT_AMBULATORY_CARE_PROVIDER_SITE_OTHER): Payer: 59

## 2021-11-12 DIAGNOSIS — J309 Allergic rhinitis, unspecified: Secondary | ICD-10-CM | POA: Diagnosis not present

## 2021-11-14 DIAGNOSIS — J3081 Allergic rhinitis due to animal (cat) (dog) hair and dander: Secondary | ICD-10-CM | POA: Diagnosis not present

## 2021-11-14 NOTE — Progress Notes (Signed)
VIALS EXP 11-14-22 °

## 2021-11-21 ENCOUNTER — Encounter: Payer: Self-pay | Admitting: Internal Medicine

## 2021-11-21 ENCOUNTER — Ambulatory Visit (INDEPENDENT_AMBULATORY_CARE_PROVIDER_SITE_OTHER): Payer: 59 | Admitting: *Deleted

## 2021-11-21 ENCOUNTER — Ambulatory Visit (INDEPENDENT_AMBULATORY_CARE_PROVIDER_SITE_OTHER): Payer: 59 | Admitting: Internal Medicine

## 2021-11-21 VITALS — BP 114/84 | HR 82 | Resp 14 | Ht 70.0 in | Wt 223.4 lb

## 2021-11-21 DIAGNOSIS — R1011 Right upper quadrant pain: Secondary | ICD-10-CM

## 2021-11-21 DIAGNOSIS — J309 Allergic rhinitis, unspecified: Secondary | ICD-10-CM

## 2021-11-21 DIAGNOSIS — Z8719 Personal history of other diseases of the digestive system: Secondary | ICD-10-CM | POA: Diagnosis not present

## 2021-11-21 DIAGNOSIS — K5909 Other constipation: Secondary | ICD-10-CM | POA: Diagnosis not present

## 2021-11-21 MED ORDER — LUBIPROSTONE 24 MCG PO CAPS: 24.0000 ug | ORAL_CAPSULE | Freq: Two times a day (BID) | ORAL | 3 refills | Status: DC

## 2021-11-21 NOTE — Patient Instructions (Signed)
Stop Amitiza .   Stop Colace and Miralax.  Start Amitiza -Take 1 tablet by mouth twice daily.   You will be contacted by [facility] Scheduling in the next 2 days to arrange a MRI/MRCP.  The number on your caller ID will be [phone], please answer when they call.  If you have not heard from them in 2 days please call [phone] to schedule.  .    If you are age 51 or younger, your body mass index should be between 19-25. Your Body mass index is 32.05 kg/m. If this is out of the aformentioned range listed, please consider follow up with your Primary Care Provider.   ________________________________________________________  The [facility] providers would like to encourage you to use [facility] to communicate with providers for non-urgent requests or questions.  Due to long hold times on the telephone, sending your provider a message by [facility] may be a faster and more efficient way to get a response.  Please allow 48 business hours for a response.  Please remember that this is for non-urgent requests.  _______________________________________________________  Thank you for choosing me and [facility].  Dr. Vonna Kotyk Pyrtle

## 2021-11-22 ENCOUNTER — Encounter: Payer: Self-pay | Admitting: Internal Medicine

## 2021-11-22 NOTE — Progress Notes (Signed)
° °  Subjective:    Patient ID: Walter Edwards, male    DOB: [birth year], 51 y.o.   MRN: [id number]  HPI Walter Edwards is a 51 year old male with a history of constipation, chronic and intermittent right upper quadrant abdominal pain, prior gallbladder disease with cholecystectomy in August 2021, history of GERD, previous repair of umbilical and inguinal hernias who is here for follow-up.  Last seen in the office in November 2022.  He reports that he continues to have issues with constipation and at times hard and sticky stools.  At times he developed significant right upper quadrant pain and pressure.  This seems to always improved with bowel movement or passing gas.  It is not a constant pain.  He tried all 3 doses of Linzess and either had diarrhea or lack of response.  He has been using Amitiza 8 mcg twice daily but also with this still needs MiraLAX at bedtime as well as docusate twice daily.  Even with this he still feels like his bowel movements are at times incomplete and more difficult for him.  His appetite has been good and he is not had breakthrough heartburn on the pantoprazole 40 mg daily.  Right upper quadrant pain does not seem to relate to movement.   Review of Systems As per HPI, otherwise negative.  Current Medications, Allergies, Past Medical History, Past Surgical History, Family History and Social History were reviewed in [employer] record.    Objective:   Physical Exam BP 114/84 (BP Location: Right Arm, Patient Position: Sitting, Cuff Size: Normal)    Pulse 82    Resp 14    Ht 5\' 10"  (1.778 m)    Wt 223 lb 6.4 oz (101.3 kg)    SpO2 97%    BMI 32.05 kg/m  Gen: awake, alert, NAD HEENT: anicteric  CV: RRR, no mrg Pulm: CTA b/l Abd: soft, NT/ND, +BS throughout Ext: no c/c/e Neuro: nonfocal  EGD 03/15/20 --normal esophagus.  Normal stomach.  Normal duodenum. Colonoscopy 04/16/2019 --normal.  Nonbleeding internal hemorrhoids    Assessment & Plan:   51 year old male with a history of constipation, chronic and intermittent right upper quadrant abdominal pain, prior gallbladder disease with cholecystectomy in August 2021, history of GERD, previous repair of umbilical and inguinal hernias who is here for follow-up.   Chronic constipation --lack of success with Linzess at multiple doses.  Amitiza 8 mcg seems to work better though he still requiring MiraLAX and docusate.  The right upper quadrant pain may be constipation related though this would be an atypical location for chronic constipation type pain. --Increase Amitiza to 24 mcg twice daily --Would initially plan to stop the MiraLAX and Colace in the last Amitiza at higher doses not fully effective  2.  Right upper quadrant abdominal pain --intermittent, persistent despite hernia repair.  Seems to get better when he has bowel movement or passes gas.  This is a typical location for constipation but certainly not impossible.  We briefly discussed the possibility of some type of referred back pain.  However given his history of gallstones I feel that we should exclude choledocholithiasis --MRI abdomen with MRCP --Constipation treatment as above  3.  CRC screening --normal colonoscopy in 2020, repeat recommended in 2030  30 minutes total spent today including patient facing time, coordination of care, reviewing medical history/procedures/pertinent radiology studies, and documentation of the encounter.

## 2021-11-27 ENCOUNTER — Ambulatory Visit (INDEPENDENT_AMBULATORY_CARE_PROVIDER_SITE_OTHER): Payer: 59 | Admitting: *Deleted

## 2021-11-27 DIAGNOSIS — J309 Allergic rhinitis, unspecified: Secondary | ICD-10-CM | POA: Diagnosis not present

## 2021-12-03 ENCOUNTER — Ambulatory Visit (INDEPENDENT_AMBULATORY_CARE_PROVIDER_SITE_OTHER): Payer: 59

## 2021-12-03 ENCOUNTER — Ambulatory Visit (HOSPITAL_COMMUNITY)
Admission: RE | Admit: 2021-12-03 | Discharge: 2021-12-03 | Disposition: A | Discharge status: Home / Self Care | Payer: 59 | Source: Ambulatory Visit | Attending: Internal Medicine | Admitting: Internal Medicine

## 2021-12-03 ENCOUNTER — Other Ambulatory Visit: Payer: Self-pay | Admitting: Internal Medicine

## 2021-12-03 ENCOUNTER — Other Ambulatory Visit: Payer: Self-pay

## 2021-12-03 ENCOUNTER — Encounter: Payer: Self-pay | Admitting: Internal Medicine

## 2021-12-03 DIAGNOSIS — Z8719 Personal history of other diseases of the digestive system: Secondary | ICD-10-CM

## 2021-12-03 DIAGNOSIS — R935 Abnormal findings on diagnostic imaging of other abdominal regions, including retroperitoneum: Secondary | ICD-10-CM | POA: Diagnosis not present

## 2021-12-03 DIAGNOSIS — R1011 Right upper quadrant pain: Secondary | ICD-10-CM

## 2021-12-03 DIAGNOSIS — J309 Allergic rhinitis, unspecified: Secondary | ICD-10-CM | POA: Diagnosis not present

## 2021-12-03 DIAGNOSIS — Z9049 Acquired absence of other specified parts of digestive tract: Secondary | ICD-10-CM | POA: Diagnosis not present

## 2021-12-03 MED ORDER — GADOBUTROL 1 MMOL/ML IV SOLN
10.0000 mL | Freq: Once | INTRAVENOUS | Status: AC | PRN
  Administered 2021-12-03: 10 mL via INTRAVENOUS

## 2021-12-12 ENCOUNTER — Ambulatory Visit (INDEPENDENT_AMBULATORY_CARE_PROVIDER_SITE_OTHER): Payer: 59

## 2021-12-12 DIAGNOSIS — J309 Allergic rhinitis, unspecified: Secondary | ICD-10-CM | POA: Diagnosis not present

## 2021-12-18 ENCOUNTER — Ambulatory Visit (INDEPENDENT_AMBULATORY_CARE_PROVIDER_SITE_OTHER): Payer: 59

## 2021-12-18 DIAGNOSIS — J309 Allergic rhinitis, unspecified: Secondary | ICD-10-CM

## 2021-12-25 ENCOUNTER — Ambulatory Visit (INDEPENDENT_AMBULATORY_CARE_PROVIDER_SITE_OTHER): Payer: 59 | Admitting: *Deleted

## 2021-12-25 DIAGNOSIS — J309 Allergic rhinitis, unspecified: Secondary | ICD-10-CM

## 2021-12-31 ENCOUNTER — Ambulatory Visit (INDEPENDENT_AMBULATORY_CARE_PROVIDER_SITE_OTHER): Payer: 59

## 2021-12-31 DIAGNOSIS — J309 Allergic rhinitis, unspecified: Secondary | ICD-10-CM

## 2022-01-14 ENCOUNTER — Ambulatory Visit (INDEPENDENT_AMBULATORY_CARE_PROVIDER_SITE_OTHER): Payer: 59

## 2022-01-14 DIAGNOSIS — J309 Allergic rhinitis, unspecified: Secondary | ICD-10-CM | POA: Diagnosis not present

## 2022-01-29 ENCOUNTER — Ambulatory Visit (INDEPENDENT_AMBULATORY_CARE_PROVIDER_SITE_OTHER): Payer: 59 | Admitting: *Deleted

## 2022-01-29 DIAGNOSIS — J309 Allergic rhinitis, unspecified: Secondary | ICD-10-CM

## 2022-02-13 ENCOUNTER — Ambulatory Visit (INDEPENDENT_AMBULATORY_CARE_PROVIDER_SITE_OTHER): Payer: 59 | Admitting: *Deleted

## 2022-02-13 DIAGNOSIS — J309 Allergic rhinitis, unspecified: Secondary | ICD-10-CM

## 2022-02-19 ENCOUNTER — Telehealth: Payer: Self-pay | Admitting: Family Medicine

## 2022-02-19 DIAGNOSIS — J3081 Allergic rhinitis due to animal (cat) (dog) hair and dander: Secondary | ICD-10-CM

## 2022-02-19 NOTE — Progress Notes (Signed)
VIALS EXP 02-20-23 ?

## 2022-02-19 NOTE — Telephone Encounter (Signed)
Patient called back information provided he will start looking for another provider and let us know if anything needed.  ?

## 2022-02-19 NOTE — Telephone Encounter (Signed)
Left message on voicemail for patient to call the office back. 

## 2022-02-19 NOTE — Telephone Encounter (Signed)
Pt called to ask permission from Dr. Selena Batten to be transferred to [facility] as his primary care provider. He stated he wants to have his labs a week before his physical, it is harder the day of. Please advise when possible. ? ?Callback Number: [phone] ?

## 2022-02-25 ENCOUNTER — Ambulatory Visit (INDEPENDENT_AMBULATORY_CARE_PROVIDER_SITE_OTHER): Payer: 59

## 2022-02-25 DIAGNOSIS — J309 Allergic rhinitis, unspecified: Secondary | ICD-10-CM | POA: Diagnosis not present

## 2022-03-12 ENCOUNTER — Ambulatory Visit (INDEPENDENT_AMBULATORY_CARE_PROVIDER_SITE_OTHER): Payer: 59

## 2022-03-12 DIAGNOSIS — J309 Allergic rhinitis, unspecified: Secondary | ICD-10-CM

## 2022-03-16 ENCOUNTER — Other Ambulatory Visit: Payer: Self-pay | Admitting: Internal Medicine

## 2022-03-27 ENCOUNTER — Ambulatory Visit (INDEPENDENT_AMBULATORY_CARE_PROVIDER_SITE_OTHER): Payer: 59

## 2022-03-27 DIAGNOSIS — J309 Allergic rhinitis, unspecified: Secondary | ICD-10-CM | POA: Diagnosis not present

## 2022-04-08 ENCOUNTER — Ambulatory Visit (INDEPENDENT_AMBULATORY_CARE_PROVIDER_SITE_OTHER): Payer: 59

## 2022-04-08 DIAGNOSIS — J309 Allergic rhinitis, unspecified: Secondary | ICD-10-CM | POA: Diagnosis not present

## 2022-04-17 ENCOUNTER — Ambulatory Visit (INDEPENDENT_AMBULATORY_CARE_PROVIDER_SITE_OTHER): Payer: 59

## 2022-04-17 DIAGNOSIS — J309 Allergic rhinitis, unspecified: Secondary | ICD-10-CM | POA: Diagnosis not present

## 2022-04-26 ENCOUNTER — Ambulatory Visit (INDEPENDENT_AMBULATORY_CARE_PROVIDER_SITE_OTHER): Payer: 59

## 2022-04-26 DIAGNOSIS — J309 Allergic rhinitis, unspecified: Secondary | ICD-10-CM | POA: Diagnosis not present

## 2022-04-29 DIAGNOSIS — M545 Low back pain, unspecified: Secondary | ICD-10-CM | POA: Diagnosis not present

## 2022-04-29 DIAGNOSIS — E7801 Familial hypercholesterolemia: Secondary | ICD-10-CM | POA: Diagnosis not present

## 2022-04-29 DIAGNOSIS — M5137 Other intervertebral disc degeneration, lumbosacral region: Secondary | ICD-10-CM | POA: Diagnosis not present

## 2022-04-29 DIAGNOSIS — I10 Essential (primary) hypertension: Secondary | ICD-10-CM | POA: Diagnosis not present

## 2022-04-29 DIAGNOSIS — Z0000 Encounter for general adult medical examination without abnormal findings: Secondary | ICD-10-CM | POA: Diagnosis not present

## 2022-04-29 DIAGNOSIS — K219 Gastro-esophageal reflux disease without esophagitis: Secondary | ICD-10-CM | POA: Diagnosis not present

## 2022-04-29 DIAGNOSIS — R972 Elevated prostate specific antigen [PSA]: Secondary | ICD-10-CM | POA: Diagnosis not present

## 2022-04-29 DIAGNOSIS — Z79899 Other long term (current) drug therapy: Secondary | ICD-10-CM | POA: Diagnosis not present

## 2022-04-29 DIAGNOSIS — K760 Fatty (change of) liver, not elsewhere classified: Secondary | ICD-10-CM | POA: Diagnosis not present

## 2022-04-29 DIAGNOSIS — Z125 Encounter for screening for malignant neoplasm of prostate: Secondary | ICD-10-CM | POA: Diagnosis not present

## 2022-04-29 DIAGNOSIS — Z6830 Body mass index (BMI) 30.0-30.9, adult: Secondary | ICD-10-CM | POA: Diagnosis not present

## 2022-04-29 DIAGNOSIS — J301 Allergic rhinitis due to pollen: Secondary | ICD-10-CM | POA: Diagnosis not present

## 2022-05-01 ENCOUNTER — Encounter: Payer: Self-pay | Admitting: Internal Medicine

## 2022-05-02 ENCOUNTER — Ambulatory Visit (INDEPENDENT_AMBULATORY_CARE_PROVIDER_SITE_OTHER): Payer: 59

## 2022-05-02 DIAGNOSIS — J309 Allergic rhinitis, unspecified: Secondary | ICD-10-CM

## 2022-05-22 ENCOUNTER — Ambulatory Visit (INDEPENDENT_AMBULATORY_CARE_PROVIDER_SITE_OTHER): Payer: 59

## 2022-05-22 DIAGNOSIS — J309 Allergic rhinitis, unspecified: Secondary | ICD-10-CM | POA: Diagnosis not present

## 2022-05-23 ENCOUNTER — Encounter: Payer: 59 | Admitting: Family Medicine

## 2022-05-23 DIAGNOSIS — E7801 Familial hypercholesterolemia: Secondary | ICD-10-CM | POA: Diagnosis not present

## 2022-05-23 DIAGNOSIS — R69 Illness, unspecified: Secondary | ICD-10-CM | POA: Diagnosis not present

## 2022-05-23 DIAGNOSIS — Z114 Encounter for screening for human immunodeficiency virus [HIV]: Secondary | ICD-10-CM | POA: Diagnosis not present

## 2022-05-23 DIAGNOSIS — Z79899 Other long term (current) drug therapy: Secondary | ICD-10-CM | POA: Diagnosis not present

## 2022-05-23 DIAGNOSIS — Z0189 Encounter for other specified special examinations: Secondary | ICD-10-CM | POA: Diagnosis not present

## 2022-05-23 DIAGNOSIS — Z125 Encounter for screening for malignant neoplasm of prostate: Secondary | ICD-10-CM | POA: Diagnosis not present

## 2022-05-29 ENCOUNTER — Ambulatory Visit (INDEPENDENT_AMBULATORY_CARE_PROVIDER_SITE_OTHER): Payer: 59

## 2022-05-29 DIAGNOSIS — J309 Allergic rhinitis, unspecified: Secondary | ICD-10-CM

## 2022-06-10 ENCOUNTER — Ambulatory Visit (INDEPENDENT_AMBULATORY_CARE_PROVIDER_SITE_OTHER): Payer: 59 | Admitting: *Deleted

## 2022-06-10 DIAGNOSIS — J309 Allergic rhinitis, unspecified: Secondary | ICD-10-CM | POA: Diagnosis not present

## 2022-06-12 ENCOUNTER — Emergency Department
Admission: EM | Admit: 2022-06-12 | Discharge: 2022-06-12 | Disposition: A | Discharge status: Home / Self Care | Payer: 59 | Attending: Emergency Medicine | Admitting: Emergency Medicine

## 2022-06-12 ENCOUNTER — Emergency Department: Payer: 59

## 2022-06-12 ENCOUNTER — Encounter: Payer: Self-pay | Admitting: Emergency Medicine

## 2022-06-12 DIAGNOSIS — W540XXA Bitten by dog, initial encounter: Secondary | ICD-10-CM | POA: Insufficient documentation

## 2022-06-12 DIAGNOSIS — S59902A Unspecified injury of left elbow, initial encounter: Secondary | ICD-10-CM | POA: Diagnosis present

## 2022-06-12 DIAGNOSIS — S51052A Open bite, left elbow, initial encounter: Secondary | ICD-10-CM | POA: Diagnosis not present

## 2022-06-12 DIAGNOSIS — Z23 Encounter for immunization: Secondary | ICD-10-CM | POA: Insufficient documentation

## 2022-06-12 DIAGNOSIS — S51852A Open bite of left forearm, initial encounter: Secondary | ICD-10-CM | POA: Diagnosis not present

## 2022-06-12 DIAGNOSIS — S51012A Laceration without foreign body of left elbow, initial encounter: Secondary | ICD-10-CM | POA: Insufficient documentation

## 2022-06-12 MED ORDER — LIDOCAINE HCL 1 % IJ SOLN
10.0000 mL | Freq: Once | INTRAMUSCULAR | Status: AC
  Administered 2022-06-12: 10 mL
  Filled 2022-06-12: qty 10

## 2022-06-12 MED ORDER — AMOXICILLIN-POT CLAVULANATE 875-125 MG PO TABS: 1.0000 | ORAL_TABLET | Freq: Two times a day (BID) | ORAL | 0 refills | Status: AC

## 2022-06-12 MED ORDER — TETANUS-DIPHTH-ACELL PERTUSSIS 5-2.5-18.5 LF-MCG/0.5 IM SUSY
0.5000 mL | PREFILLED_SYRINGE | Freq: Once | INTRAMUSCULAR | Status: AC
Start: 2022-06-12 — End: 2022-06-12
  Administered 2022-06-12: 0.5 mL via INTRAMUSCULAR
  Filled 2022-06-12: qty 0.5

## 2022-06-12 NOTE — ED Notes (Signed)
Called to room x1

## 2022-06-12 NOTE — ED Provider Notes (Signed)
[facility] Provider Note  Patient Contact: 10:36 PM (approximate)   History   Animal Bite   HPI  Walter Edwards is a 51 y.o. male presents to the emergency department with a deep puncture wound to underlying muscle after patient was bitten by a neighbor's great Dane with known rabies negative status.  [place] and is requesting antibiotics.  He declines rabies prophylaxis.      Physical Exam   Triage Vital Signs: ED Triage Vitals [06/12/22 2037]  Enc Vitals Group     BP (!) 131/91     Pulse Rate 94     Resp 16     Temp 98.4 F (36.9 C)     Temp Source Oral     SpO2 98 %     Weight 224 lb 13.9 oz (102 kg)     Height 5\' 10"  (1.778 m)     Head Circumference      Peak Flow      Pain Score 4     Pain Loc      Pain Edu?      Excl. in GC?     Most recent vital signs: Vitals:   06/12/22 2037  BP: (!) 131/91  Pulse: 94  Resp: 16  Temp: 98.4 F (36.9 C)  SpO2: 98%     General: Alert and in no acute distress. Eyes:  PERRL. EOMI. Head: No acute traumatic findings ENT:      Nose: No congestion/rhinnorhea.      Mouth/Throat: Mucous membranes are moist. Neck: No stridor. No cervical spine tenderness to palpation. Cardiovascular:  Good peripheral perfusion Respiratory: Normal respiratory effort without tachypnea or retractions. Lungs CTAB. Good air entry to the bases with no decreased or absent breath sounds. Gastrointestinal: Bowel sounds 4 quadrants. Soft and nontender to palpation. No guarding or rigidity. No palpable masses. No distention. No CVA tenderness. Musculoskeletal: Full range of motion to all extremities.  Neurologic:  No gross focal neurologic deficits are appreciated.  Skin: Patient has 2 cm puncture wound deep to underlying muscle. Other:   ED Results / Procedures / Treatments   Labs (all labs ordered are listed, but only abnormal results are displayed) Labs Reviewed - No data to  display     RADIOLOGY  I personally viewed and evaluated these images as part of my medical decision making, as well as reviewing the written report by the radiologist.  ED Provider Interpretation: No acute bony abnormality was visualized along the left elbow.   PROCEDURES:  Critical Care performed: No  ..Laceration Repair  Date/Time: 06/12/2022 10:38 PM  Performed by: Orvil Feil, PA-C Authorized by: Orvil Feil, PA-C   Consent:    Consent obtained:  Verbal   Risks discussed:  Infection and pain Universal protocol:    Procedure explained and questions answered to patient or proxy's satisfaction: yes     Patient identity confirmed:  Verbally with patient Anesthesia:    Anesthesia method:  Local infiltration   Local anesthetic:  Lidocaine 1% w/o epi Laceration details:    Location:  Shoulder/arm   Shoulder/arm location:  L elbow   Length (cm):  2   Depth (mm):  1 Pre-procedure details:    Preparation:  Patient was prepped and draped in usual sterile fashion Exploration:    Limited defect created (wound extended): yes     Imaging obtained: x-ray     Imaging outcome: foreign body not noted  Wound exploration: wound explored through full range of motion     Contaminated: yes   Treatment:    Area cleansed with:  Povidone-iodine   Amount of cleaning:  Extensive   Irrigation solution:  Sterile saline   Irrigation volume:  500   Visualized foreign bodies/material removed: no     Debridement:  None   Undermining:  None   Scar revision: no     Layers/structures repaired:  Deep subcutaneous Deep subcutaneous:    Suture size:  5-0   Suture material:  Chromic gut   Suture technique:  Simple interrupted   Number of sutures:  4 Skin repair:    Repair method:  Sutures   Suture size:  4-0   Suture material:  Nylon   Suture technique:  Simple interrupted   Number of sutures:  2 Approximation:    Approximation:  Loose Repair type:    Repair type:   Intermediate Post-procedure details:    Dressing:  Non-adherent dressing    MEDICATIONS ORDERED IN ED: Medications  Tdap (BOOSTRIX) injection 0.5 mL (0.5 mLs Intramuscular Given 06/12/22 2212)  lidocaine (XYLOCAINE) 1 % (with pres) injection 10 mL (10 mLs Infiltration Given by Other 06/12/22 2215)     IMPRESSION / MDM / ASSESSMENT AND PLAN / ED COURSE  I reviewed the triage vital signs and the nursing notes.                              Assessment and plan Dog bite wound 51 year old male presents to the emergency department after patient was bitten by his neighbors dog.  Vital signs were reassuring at triage.  On exam, patient was alert, active and nontoxic-appearing.  Wound was irrigated copiously in the emergency department.  I explained to patient that there is a very high risk of infection with laceration repair with sutures.  Given the depth of puncture wound, repair could not be avoided.  I used 4 deep sutures and 2 external sutures to repair skin.  I loosely closed the external skin to allow for drainage if need be.  Patient tetanus status was updated in the emergency department and he was discharged with Augmentin.   FINAL CLINICAL IMPRESSION(S) / ED DIAGNOSES   Final diagnoses:  Dog bite, initial encounter     Rx / DC Orders   ED Discharge Orders          Ordered    amoxicillin-clavulanate (AUGMENTIN) 875-125 MG tablet  2 times daily        06/12/22 2234             Note:  This document was prepared using Dragon voice recognition software and may include unintentional dictation errors.   Pia Mau [place], PA-C 06/12/22 2241    Merwyn Katos, MD 06/13/22 1110

## 2022-06-12 NOTE — ED Triage Notes (Signed)
Pt presents via POV with complaints of a dog bite to the left elbow from neighbors dog. One puncture wound in the left [facility] - bleeding well controlled with guaze and curlex. Cap refil <3 secs.    C. Com notified of the incident.

## 2022-06-12 NOTE — Discharge Instructions (Addendum)
Take Augmentin twice daily for the next 7 days. Have external sutures removed in 7 days. If there is any redness or streaking, this please seek care at emergency department.

## 2022-06-19 DIAGNOSIS — J301 Allergic rhinitis due to pollen: Secondary | ICD-10-CM | POA: Diagnosis not present

## 2022-06-25 DIAGNOSIS — I10 Essential (primary) hypertension: Secondary | ICD-10-CM | POA: Diagnosis not present

## 2022-06-25 DIAGNOSIS — Z79899 Other long term (current) drug therapy: Secondary | ICD-10-CM | POA: Diagnosis not present

## 2022-06-25 DIAGNOSIS — K219 Gastro-esophageal reflux disease without esophagitis: Secondary | ICD-10-CM | POA: Diagnosis not present

## 2022-06-25 DIAGNOSIS — Z125 Encounter for screening for malignant neoplasm of prostate: Secondary | ICD-10-CM | POA: Diagnosis not present

## 2022-06-25 DIAGNOSIS — K760 Fatty (change of) liver, not elsewhere classified: Secondary | ICD-10-CM | POA: Diagnosis not present

## 2022-07-03 ENCOUNTER — Encounter: Payer: Self-pay | Admitting: Internal Medicine

## 2022-07-10 DIAGNOSIS — J301 Allergic rhinitis due to pollen: Secondary | ICD-10-CM | POA: Diagnosis not present

## 2022-07-16 ENCOUNTER — Ambulatory Visit: Payer: Self-pay

## 2022-07-16 DIAGNOSIS — Z0000 Encounter for general adult medical examination without abnormal findings: Secondary | ICD-10-CM

## 2022-07-16 LAB — POCT URINALYSIS DIPSTICK
Bilirubin, UA: NEGATIVE
Blood, UA: NEGATIVE
Glucose, UA: NEGATIVE
Ketones, UA: NEGATIVE
Leukocytes, UA: NEGATIVE
Nitrite, UA: NEGATIVE
Protein, UA: NEGATIVE
Spec Grav, UA: 1.025 (ref 1.010–1.025)
Urobilinogen, UA: 0.2 E.U./dL
pH, UA: 6 (ref 5.0–8.0)

## 2022-07-16 NOTE — Progress Notes (Unsigned)
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 Pt scheduled to complete physical.

## 2022-07-18 ENCOUNTER — Other Ambulatory Visit: Payer: Self-pay | Admitting: Family Medicine

## 2022-07-19 ENCOUNTER — Ambulatory Visit: Payer: 59 | Admitting: Allergy

## 2022-07-22 ENCOUNTER — Encounter: Payer: Self-pay | Admitting: Physician Assistant

## 2022-07-22 ENCOUNTER — Ambulatory Visit: Payer: Self-pay | Admitting: Physician Assistant

## 2022-07-22 VITALS — Temp 99.5°F | Resp 14 | Ht 70.0 in | Wt 220.0 lb

## 2022-07-22 DIAGNOSIS — R972 Elevated prostate specific antigen [PSA]: Secondary | ICD-10-CM

## 2022-07-22 DIAGNOSIS — M545 Low back pain, unspecified: Secondary | ICD-10-CM

## 2022-07-22 DIAGNOSIS — J301 Allergic rhinitis due to pollen: Secondary | ICD-10-CM | POA: Diagnosis not present

## 2022-07-22 DIAGNOSIS — Z0000 Encounter for general adult medical examination without abnormal findings: Secondary | ICD-10-CM

## 2022-07-22 NOTE — Progress Notes (Signed)
[employer] [place] occupational health clinic  ____________________________________________   None    (approximate)  I have reviewed the triage vital signs and the nursing notes.   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  Patient voiced concerns for continued back pain secondary to a work-related injury 3 years ago.  Patient also had a lumbar fusion.  Patient states continues to take pain medications to include muscle relaxers and anti-inflammatory drugs.  Patient also voiced concern for increased PSA.         Past Medical History:  Diagnosis Date   Allergy    Chronic back pain    Gastritis    GERD (gastroesophageal reflux disease)    History of kidney stones    Hyperlipidemia    Hypertension    Inguinal hernia    Internal hemorrhoids    Low HDL (under 40)    Mild depression    Otitis media    Seasonal rhinitis     Patient Active Problem Edwards   Diagnosis Date Noted   Cyst on ear 01/29/2021   Gastroesophageal reflux disease 01/09/2021   Fatty liver 01/09/2021   DDD (degenerative disc disease), lumbosacral 10/03/2020   Athlete's foot on left 03/29/2020   Seasonal allergic rhinitis 03/29/2020   History of fusion of lumbar spine 11/11/2019   Essential hypertension 11/11/2019   Elevated PSA, less than 10 ng/ml 11/11/2019   RUQ abdominal pain 11/11/2019   Plantar fasciitis 11/11/2019   Body mass index (BMI) 30.0-30.9, adult 09/28/2019   Acute back pain with sciatica 03/27/2018    Past Surgical History:  Procedure Laterality Date   BACK SURGERY     x3   CHOLECYSTECTOMY  06/02/2020   CHOLECYSTECTOMY N/A 06/02/2020   Procedure: LAPAROSCOPIC CHOLECYSTECTOMY;  Surgeon: [name], MD;  Location: [facility] OR;  Service: General;  Laterality: N/A;   COLONOSCOPY     ENDOSCOPIC PLANTAR FASCIOTOMY  05/23/2020   VENTRAL HERNIA REPAIR N/A 07/17/2021   Procedure: Caren Macadam HERNIA  REPAIR;  Surgeon: [name], MD;  Location: [facility];  Service: General;  Laterality: N/A;    Prior to Admission medications   Medication Sig Start Date End Date Taking? Authorizing Provider  amLODipine (NORVASC) 10 MG tablet Take 1 tablet (10 mg total) by mouth daily. 05/25/21  Yes Lynnda Child, MD  cyclobenzaprine (FLEXERIL) 10 MG tablet Take 10 mg by mouth daily as needed (Back pain).  04/04/20  Yes [provider]  diphenhydrAMINE (BENADRYL) 25 MG tablet Take 25 mg by mouth See admin instructions. Takes with hydrocodone to prevent itching as needed   Yes [provider]  docusate sodium (COLACE) 250 MG capsule Take 500 mg by mouth daily after lunch.   Yes [provider]  EPINEPHrine 0.3 mg/0.3 mL IJ SOAJ injection Inject 0.3 mg into the muscle as needed. 01/04/21  Yes [provider]  fexofenadine (ALLEGRA) 180 MG tablet Take 1 tablet (180 mg total) by mouth daily. 03/29/20  Yes Lynnda Child, MD  gabapentin (NEURONTIN) 300 MG capsule Take 300 mg by mouth 3 (three) times daily. 07/18/22  Yes [provider]  HYDROcodone-acetaminophen (NORCO) 10-325 MG tablet Take by mouth. 06/04/21  Yes [provider]  ibuprofen (ADVIL) 800 MG tablet Take 1 tablet by mouth 3 (three) times daily as needed. 07/05/22  Yes [provider]  lubiprostone (AMITIZA) 24 MCG capsule TAKE 1 CAPSULE BY  MOUTH TWICE DAILY WITHA MEAL 03/19/22  Yes Pyrtle, Carie Caddy, MD  montelukast (SINGULAIR) 10 MG tablet TAKE 1 TABLET BY MOUTH EVERY MORNING FORALLERGIES 07/06/21  Yes Lynnda Child, MD  Multiple Vitamin (MULTIVITAMIN) tablet Take 1 tablet by mouth daily.   Yes [provider]  pantoprazole (PROTONIX) 40 MG tablet TAKE 1 TABLET BY MOUTH ONCE A DAY 07/18/22  Yes Sparks, Duane Lope, MD  polyethylene glycol powder (GLYCOLAX/MIRALAX) 17 GM/SCOOP powder Take 1 Scoop by mouth 2 (two) times daily. Clear lax   Yes [provider]   Sennosides-Docusate Sodium (EQ STOOL SOFTENER/LAXATIVE PO) Take 1 tablet by mouth daily.   Yes [provider]  tamsulosin (FLOMAX) 0.4 MG CAPS capsule Take 0.4 mg by mouth daily. 06/25/22  Yes [provider]  triamcinolone (NASACORT) 55 MCG/ACT AERO nasal inhaler Place 2 sprays into the nose daily. 01/04/21  Yes Padgett, Pilar Grammes, MD  Vit C-Quercet-Bioflv-Bromelain [phone] MG CAPS Take 2 Doses by mouth 3 (three) times daily between meals. 11/21/20  Yes Pricilla Handler, MD    Allergies Hydrocodone and Tessalon perles [benzonatate]  Family History  Problem Relation Age of Onset   Diabetes Mother    Anxiety disorder Mother    Hypertension Mother    Diabetes Father    Hypertension Father    Obesity Sister    Hypertension Sister    Colon cancer Neg Hx    Pancreatic cancer Neg Hx    Esophageal cancer Neg Hx    Liver cancer Neg Hx     Social History Social History   Tobacco Use   Smoking status: Never   Smokeless tobacco: Former  Building services engineer Use: Never used  Substance Use Topics   Alcohol use: Yes    Comment: on the weekends maybe   Drug use: Never    Review of Systems Constitutional: No fever/chills Eyes: No visual changes. ENT: No sore throat. Cardiovascular: Denies chest pain. Respiratory: Denies shortness of breath. Gastrointestinal: No abdominal pain.  No nausea, no vomiting.  No diarrhea.  No constipation. Genitourinary: Negative for dysuria.  BPH Musculoskeletal: Negative for back pain. Skin: Negative for rash. Neurological: Negative for headaches, focal weakness or numbness. Endocrine: Hypertension Hematological/Lymphatic:  Allergic/Immunilogical: Hydrocodone and Tessalon  ____________________________________________   PHYSICAL EXAM:  VITAL SIGNS: BP is 134/94, respiration 14, temperature 99.5, patient weighs 220 pounds, BMI is 31.57. Constitutional: Alert and oriented. Well appearing and in no acute distress. Eyes:  Conjunctivae are normal. PERRL. EOMI. Head: Atraumatic. Nose: No congestion/rhinnorhea. Mouth/Throat: Mucous membranes are moist.  Oropharynx non-erythematous. Neck: No stridor.  No cervical spine tenderness to palpation Hematological/Lymphatic/Immunilogical: No cervical lymphadenopathy. Cardiovascular: Normal rate, regular rhythm. Grossly normal heart sounds.  Good peripheral circulation. Respiratory: Normal respiratory effort.  No retractions. Lungs CTAB. Gastrointestinal: Soft and nontender. No distention. No abdominal bruits. No CVA tenderness. Genitourinary: Deferred Musculoskeletal: No lower extremity tenderness nor edema.  No joint effusions.  Bilateral paraspinal muscle spasm with lateral flexion motions. Neurologic:  Normal speech and language. No gross focal neurologic deficits are appreciated. No gait instability. Skin:  Skin is warm, dry and intact. No rash noted. Psychiatric: Mood and affect are normal. Speech and behavior are normal.  ____________________________________________   LABS ____________________________________________  EKG  Normal sinus rhythm at 79 bpm. ____________________________________________     ____________________________________________   INITIAL IMPRESSION / ASSESSMENT AND PLAN / ED COURSE  As part of my medical decision making, I reviewed the following data within the electronic MEDICAL RECORD NUMBER  Discussed lab results with patient showing increase of PSA at 4.89 with last year reading of 3.78.  Patient has an appointment with urology for further evaluation.  Patient also has chronic back pain which has worsened in the past month.  Patient continue to take muscle relaxers and anti-inflammatory medication.  Patient is scheduled orthopedic appointment for reevaluation.        ____________________________________________   FINAL CLINICAL IMPRESSION(S) / ED DIAGNOSES  Well exam, with elevated PSA and chronic back pain.   ED  Discharge Orders     None        Note:  This document was prepared using Dragon voice recognition software and may include unintentional dictation errors.

## 2022-07-22 NOTE — Progress Notes (Signed)
Pt presents today to complete physical. Pt concerned of lab levels PSA, pt did say he has an appointment with urologist Burna Sis

## 2022-07-25 DIAGNOSIS — J301 Allergic rhinitis due to pollen: Secondary | ICD-10-CM | POA: Diagnosis not present

## 2022-07-29 DIAGNOSIS — J301 Allergic rhinitis due to pollen: Secondary | ICD-10-CM | POA: Diagnosis not present

## 2022-07-31 ENCOUNTER — Ambulatory Visit (INDEPENDENT_AMBULATORY_CARE_PROVIDER_SITE_OTHER): Payer: 59 | Admitting: Urology

## 2022-07-31 ENCOUNTER — Encounter: Payer: Self-pay | Admitting: Urology

## 2022-07-31 VITALS — BP 158/97 | HR 109 | Ht 70.0 in | Wt 225.0 lb

## 2022-07-31 DIAGNOSIS — R972 Elevated prostate specific antigen [PSA]: Secondary | ICD-10-CM

## 2022-07-31 NOTE — Progress Notes (Signed)
07/31/2022 8:12 PM   Walter Edwards 02-May-1971 [id number]  Referring provider: Marguarite Arbour, MD [address] [facility] [place],  [place] [address]  Chief Complaint  Patient presents with   Elevated PSA    HPI: Walter Edwards is a 51 y.o. male referred for evaluation of an elevated PSA.  PSA 05/23/2022 was 4.89 ng/mL A PSA in 2020 was 4.6 He was seen at [facility] in [place] and underwent a prostate biopsy with benign pathology No bothersome LUTS Prior history stone disease No family history prostate cancer in first-degree relatives   PMH: Past Medical History:  Diagnosis Date   Allergy    Chronic back pain    Gastritis    GERD (gastroesophageal reflux disease)    History of kidney stones    Hyperlipidemia    Hypertension    Inguinal hernia    Internal hemorrhoids    Low HDL (under 40)    Mild depression    Otitis media    Seasonal rhinitis     Surgical History: Past Surgical History:  Procedure Laterality Date   BACK SURGERY     x3   CHOLECYSTECTOMY  06/02/2020   CHOLECYSTECTOMY N/A 06/02/2020   Procedure: LAPAROSCOPIC CHOLECYSTECTOMY;  Surgeon: [name], MD;  Location: [facility] OR;  Service: General;  Laterality: N/A;   COLONOSCOPY     ENDOSCOPIC PLANTAR FASCIOTOMY  05/23/2020   VENTRAL HERNIA REPAIR N/A 07/17/2021   Procedure: Caren Macadam HERNIA REPAIR;  Surgeon: [name], MD;  Location: [facility];  Service: General;  Laterality: N/A;    Home Medications:  Allergies as of 07/31/2022       Reactions   Hydrocodone Itching   Tessalon Perles [benzonatate] Rash   itching        Medication List        Accurate as of July 31, 2022  8:12 PM. If you have any questions, ask your nurse or doctor.          amLODipine 10 MG tablet Commonly known as: NORVASC Take 1 tablet (10 mg total) by mouth daily.   cyclobenzaprine 10 MG tablet Commonly known as: FLEXERIL Take 10 mg by mouth  daily as needed (Back pain).   diphenhydrAMINE 25 MG tablet Commonly known as: BENADRYL Take 25 mg by mouth See admin instructions. Takes with hydrocodone to prevent itching as needed   docusate sodium 250 MG capsule Commonly known as: COLACE Take 500 mg by mouth daily after lunch.   EPINEPHrine 0.3 mg/0.3 mL Soaj injection Commonly known as: EPI-PEN Inject 0.3 mg into the muscle as needed.   EQ STOOL SOFTENER/LAXATIVE PO Take 1 tablet by mouth daily.   fexofenadine 180 MG tablet Commonly known as: ALLEGRA Take 1 tablet (180 mg total) by mouth daily.   gabapentin 300 MG capsule Commonly known as: NEURONTIN Take 300 mg by mouth 3 (three) times daily.   HYDROcodone-acetaminophen 10-325 MG tablet Commonly known as: NORCO Take by mouth.   ibuprofen 800 MG tablet Commonly known as: ADVIL Take 1 tablet by mouth 3 (three) times daily as needed.   lubiprostone 24 MCG capsule Commonly known as: AMITIZA TAKE 1 CAPSULE BY MOUTH TWICE DAILY WITHA MEAL   montelukast 10 MG tablet Commonly known as: SINGULAIR TAKE 1 TABLET BY MOUTH EVERY MORNING FORALLERGIES   multivitamin tablet Take 1 tablet by mouth daily.   pantoprazole 40 MG tablet Commonly known as: PROTONIX TAKE 1 TABLET BY MOUTH ONCE A DAY  polyethylene glycol powder 17 GM/SCOOP powder Commonly known as: GLYCOLAX/MIRALAX Take 1 Scoop by mouth 2 (two) times daily. Clear lax   tamsulosin 0.4 MG Caps capsule Commonly known as: FLOMAX Take 0.4 mg by mouth daily.   triamcinolone 55 MCG/ACT Aero nasal inhaler Commonly known as: NASACORT Place 2 sprays into the nose daily.   Vit C-Quercet-Bioflv-Bromelain 450-250-125-50 MG Caps Take 2 Doses by mouth 3 (three) times daily between meals.        Allergies:  Allergies  Allergen Reactions   Hydrocodone Itching   Tessalon Perles [Benzonatate] Rash    itching    Family History: Family History  Problem Relation Age of Onset   Diabetes Mother    Anxiety  disorder Mother    Hypertension Mother    Diabetes Father    Hypertension Father    Obesity Sister    Hypertension Sister    Colon cancer Neg Hx    Pancreatic cancer Neg Hx    Esophageal cancer Neg Hx    Liver cancer Neg Hx     Social History:  reports that he has never smoked. He has quit using smokeless tobacco. He reports current alcohol use. He reports that he does not use drugs.   Physical Exam: BP (!) 158/97   Pulse (!) 109   Ht 5\' 10"  (1.778 m)   Wt 225 lb (102.1 kg)   BMI 32.28 kg/m   Constitutional:  Alert and oriented, No acute distress. HEENT: [place] AT Respiratory: Normal respiratory effort, no increased work of breathing. GU: Prostate 50 g, smooth without nodules Skin: No rashes, bruises or suspicious lesions. Psychiatric: Normal mood and affect.   Assessment & Plan:    1. Elevated PSA Previous benign biopsy for PSA 4.6 Release signed to obtain [facility] records for review PSA repeated today in the event this was a transient elevation He will be contacted with the PSA results and once his urology records from [facility] are reviewed   Abbie Sons, [place] [address] [place],  [address] [phone]

## 2022-08-01 DIAGNOSIS — J301 Allergic rhinitis due to pollen: Secondary | ICD-10-CM | POA: Diagnosis not present

## 2022-08-01 LAB — PSA: Prostate Specific Ag, Serum: 4.3 ng/mL — ABNORMAL HIGH (ref 0.0–4.0)

## 2022-08-02 ENCOUNTER — Encounter: Payer: Self-pay | Admitting: Urology

## 2022-08-03 ENCOUNTER — Other Ambulatory Visit: Payer: Self-pay | Admitting: Internal Medicine

## 2022-08-05 DIAGNOSIS — J301 Allergic rhinitis due to pollen: Secondary | ICD-10-CM | POA: Diagnosis not present

## 2022-08-08 DIAGNOSIS — J301 Allergic rhinitis due to pollen: Secondary | ICD-10-CM | POA: Diagnosis not present

## 2022-08-09 DIAGNOSIS — I10 Essential (primary) hypertension: Secondary | ICD-10-CM | POA: Diagnosis not present

## 2022-08-09 DIAGNOSIS — J301 Allergic rhinitis due to pollen: Secondary | ICD-10-CM | POA: Diagnosis not present

## 2022-08-09 DIAGNOSIS — K219 Gastro-esophageal reflux disease without esophagitis: Secondary | ICD-10-CM | POA: Diagnosis not present

## 2022-08-12 DIAGNOSIS — J301 Allergic rhinitis due to pollen: Secondary | ICD-10-CM | POA: Diagnosis not present

## 2022-08-19 DIAGNOSIS — J301 Allergic rhinitis due to pollen: Secondary | ICD-10-CM | POA: Diagnosis not present

## 2022-08-26 DIAGNOSIS — J301 Allergic rhinitis due to pollen: Secondary | ICD-10-CM | POA: Diagnosis not present

## 2022-08-29 DIAGNOSIS — M546 Pain in thoracic spine: Secondary | ICD-10-CM | POA: Diagnosis not present

## 2022-08-29 DIAGNOSIS — R1010 Upper abdominal pain, unspecified: Secondary | ICD-10-CM | POA: Diagnosis not present

## 2022-08-29 DIAGNOSIS — J301 Allergic rhinitis due to pollen: Secondary | ICD-10-CM | POA: Diagnosis not present

## 2022-09-02 DIAGNOSIS — J301 Allergic rhinitis due to pollen: Secondary | ICD-10-CM | POA: Diagnosis not present

## 2022-09-05 ENCOUNTER — Encounter: Payer: Self-pay | Admitting: Physician Assistant

## 2022-09-05 ENCOUNTER — Ambulatory Visit (INDEPENDENT_AMBULATORY_CARE_PROVIDER_SITE_OTHER): Payer: 59 | Admitting: Physician Assistant

## 2022-09-05 VITALS — BP 112/82 | HR 98 | Ht 70.0 in | Wt 233.0 lb

## 2022-09-05 DIAGNOSIS — R1013 Epigastric pain: Secondary | ICD-10-CM | POA: Diagnosis not present

## 2022-09-05 DIAGNOSIS — J301 Allergic rhinitis due to pollen: Secondary | ICD-10-CM | POA: Diagnosis not present

## 2022-09-05 NOTE — Patient Instructions (Signed)
You have been scheduled for an endoscopy. Please follow written instructions given to you at your visit today. If you use inhalers (even only as needed), please bring them with you on the day of your procedure.  _____________________________________________________  If you are age 51 or older, your body mass index should be between 23-30. Your Body mass index is 33.43 kg/m. If this is out of the aforementioned range listed, please consider follow up with your Primary Care Provider.  If you are age 7 or younger, your body mass index should be between 19-25. Your Body mass index is 33.43 kg/m. If this is out of the aformentioned range listed, please consider follow up with your Primary Care Provider.   _____________________________________________________  The [facility] providers would like to encourage you to use [facility] to communicate with providers for non-urgent requests or questions.  Due to long hold times on the telephone, sending your provider a message by [facility] may be a faster and more efficient way to get a response.  Please allow 48 business hours for a response.  Please remember that this is for non-urgent requests.  _____________________________________________________  Due to recent changes in healthcare laws, you may see the results of your imaging and laboratory studies on MyChart before your provider has had a chance to review them.  We understand that in some cases there may be results that are confusing or concerning to you. Not all laboratory results come back in the same time frame and the provider may be waiting for multiple results in order to interpret others.  Please give [place] 48 hours in order for your provider to thoroughly review all the results before contacting the office for clarification of your results.   Thank you for choosing me and [facility].  Hyacinth Meeker PA-C

## 2022-09-05 NOTE — Progress Notes (Signed)
Chief Complaint: Upper abdominal pain  HPI:    Walter Edwards is a 51 year old male with a past medical history as listed below including chronic back pain, GERD and multiple others, known to Dr. Rhea Belton, who was referred to me by Marguarite Arbour, MD for a complaint of upper abdominal pain.      04/16/2019 normal colonoscopy with nonbleeding internal hemorrhoids.    03/15/2020 EGD with normal esophagus, normal stomach and normal duodenum.    08/28/2021 patient seen in clinic and described constipation.  He was given a trial Of Amitiza 8 mcg twice a day.    11/21/2021 office visit with Dr. Rhea Belton.  At that time increased Amitiza 24 mcg twice a day.  Ordered an MRI/MRCP for right upper quadrant pain.    12/03/2021 MRI with status postcholecystectomy and no biliary ductal dilation or choledocholithiasis.  No acute abnormality in the abdomen.    08/29/2022 office visit with PCP to discuss upper abdominal/epigastric pain and thoracic back pain.  At that time patient was tried on Elavil nightly with Baclofen 3 times daily and referred to [place] for EGD.    Today, the patient tells me he has been going through evaluation for this epigastric/right upper quadrant pain for over 3 years now, he has had his gallbladder out, had endo/colonoscopy, had an MRI, followed with the back surgeon who tell him its not his back at all after 2 MRIs, he was sent to the pain clinic with nerve blocks which did not help, he has been tried on Gabapentin which did not help.  Tells me he was working with PT currently trying some dry needling which does not seem to help.  Tells me to him it feels most like a musculoskeletal/nerve pain.  There is tingling/pinpricking sensation when he rubs lightly over his epigastrium and sometimes when he is pulling or moving things this pain intensifies to a crippling affect.  He explains today that his PCP wants to have a repeat EGD prior to doing any other evaluation.    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    Denies fever, chills, nausea, vomiting, heartburn, reflux weight loss, change in bowel habits or blood in his stool.     Past Medical History:  Diagnosis Date   Allergy    Chronic back pain    Gastritis    GERD (gastroesophageal reflux disease)    History of kidney stones    Hyperlipidemia    Hypertension    Inguinal hernia    Internal hemorrhoids    Low HDL (under 40)    Mild depression    Otitis media    Seasonal rhinitis     Past Surgical History:  Procedure Laterality Date   BACK SURGERY     x3   CHOLECYSTECTOMY  06/02/2020   CHOLECYSTECTOMY N/A 06/02/2020   Procedure: LAPAROSCOPIC CHOLECYSTECTOMY;  Surgeon: [name], MD;  Location: [facility] OR;  Service: General;  Laterality: N/A;   COLONOSCOPY     ENDOSCOPIC PLANTAR FASCIOTOMY  05/23/2020   VENTRAL HERNIA REPAIR N/A 07/17/2021   Procedure: Caren Macadam HERNIA REPAIR;  Surgeon: [name], MD;  Location:  SURGERY CENTER;  Service: General;  Laterality: N/A;    Current Outpatient Medications  Medication Sig Dispense Refill   amLODipine (NORVASC) 10 MG tablet Take 1 tablet (10 mg total) by mouth daily.  90 tablet 3   cyclobenzaprine (FLEXERIL) 10 MG tablet Take 10 mg by mouth daily as needed (Back pain).      diphenhydrAMINE (BENADRYL) 25 MG tablet Take 25 mg by mouth See admin instructions. Takes with hydrocodone to prevent itching as needed     docusate sodium (COLACE) 250 MG capsule Take 500 mg by mouth daily after lunch.     EPINEPHrine 0.3 mg/0.3 mL IJ SOAJ injection Inject 0.3 mg into the muscle as needed.     fexofenadine (ALLEGRA) 180 MG tablet Take 1 tablet (180 mg total) by mouth daily. 90 tablet 3   HYDROcodone-acetaminophen (NORCO) 10-325 MG tablet Take by mouth.     ibuprofen (ADVIL) 800 MG tablet Take 1 tablet by mouth 3 (three) times daily as needed.     lubiprostone (AMITIZA) 24  MCG capsule TAKE 1 CAPSULE BY MOUTH TWICE DAILY WITHA MEAL 60 capsule 3   montelukast (SINGULAIR) 10 MG tablet TAKE 1 TABLET BY MOUTH EVERY MORNING FORALLERGIES 90 tablet 3   Multiple Vitamin (MULTIVITAMIN) tablet Take 1 tablet by mouth daily.     pantoprazole (PROTONIX) 40 MG tablet TAKE 1 TABLET BY MOUTH ONCE A DAY 90 tablet 3   Sennosides-Docusate Sodium (EQ STOOL SOFTENER/LAXATIVE PO) Take 1 tablet by mouth daily.     tamsulosin (FLOMAX) 0.4 MG CAPS capsule Take 0.4 mg by mouth daily.     triamcinolone (NASACORT) 55 MCG/ACT AERO nasal inhaler Place 2 sprays into the nose daily. 16.5 g 5   Vit C-Quercet-Bioflv-Bromelain 450-250-125-50 MG CAPS Take 2 Doses by mouth 3 (three) times daily between meals. 42 capsule 0   gabapentin (NEURONTIN) 300 MG capsule Take 300 mg by mouth 3 (three) times daily. (Patient not taking: Reported on 09/05/2022)     polyethylene glycol powder (GLYCOLAX/MIRALAX) 17 GM/SCOOP powder Take 1 Scoop by mouth 2 (two) times daily. Clear lax (Patient not taking: Reported on 09/05/2022)     No current facility-administered medications for this visit.    Allergies as of 09/05/2022 - Review Complete 09/05/2022  Allergen Reaction Noted   Hydrocodone Itching 08/13/2019   Tessalon perles [benzonatate] Rash 11/11/2019    Family History  Problem Relation Age of Onset   Diabetes Mother    Anxiety disorder Mother    Hypertension Mother    Diabetes Father    Hypertension Father    Obesity Sister    Hypertension Sister    Colon cancer Neg Hx    Pancreatic cancer Neg Hx    Esophageal cancer Neg Hx    Liver cancer Neg Hx     Social History   Socioeconomic History   Marital status: Single    Spouse name: Not on file   Number of children: 1   Years of education: [employer] education level: Not on file  Occupational History   Not on file  Tobacco Use   Smoking status: Never   Smokeless tobacco: Former  Building services engineer Use: Never used   Substance and Sexual Activity   Alcohol use: Yes    Comment: on the weekends maybe   Drug use: Never   Sexual activity: Yes    Birth control/protection: Condom  Other Topics Concern   Not on file  Social History Narrative   11/11/19   From: the area   Living: alone   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 [place]      Family: 1 daughter Ladona Ridgel - 2003  Enjoys: relax, outdoor activity, hiking, concerts      Exercise: hiking, 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, 9-10K steps per day   Diet: had done low carb, but not doing great now, meat/veggies/fruit      Safety   Seat belts: Yes    Guns: Yes  and secure   Safe in relationships: Yes    Social Determinants of Health   Financial Resource Strain: Not on file  Food Insecurity: Not on file  Transportation Needs: Not on file  Physical Activity: Not on file  Stress: Not on file  Social Connections: Not on file  Intimate Partner Violence: Not on file    Review of Systems:    Constitutional: No weight loss, fever or chills Cardiovascular: No chest pain Respiratory: No SOB  Gastrointestinal: See HPI and otherwise negative   Physical Exam:  Vital signs: BP 112/82   Pulse 98   Ht 5\' 10"  (1.778 m)   Wt 233 lb (105.7 kg)   BMI 33.43 kg/m    Constitutional:   Pleasant Caucasian male appears to be in NAD, Well developed, Well nourished, alert and cooperative Respiratory: Respirations even and unlabored. Lungs clear to auscultation bilaterally.   No wheezes, crackles, or rhonchi.  Cardiovascular: Normal S1, S2. No MRG. Regular rate and rhythm. No peripheral edema, cyanosis or pallor.  Gastrointestinal:  Soft, nondistended, nontender. No rebound or guarding. Normal bowel sounds. No appreciable masses or hepatomegaly. +mild ttp to rubbing/palpation over epigastrum Rectal:  Not performed.  Psychiatric: Oriented to person, place and time. Demonstrates good judgement and reason without abnormal affect or  behaviors.  No recent labs.  Assessment: 1.  Epigastric pain: Seems to be to only light palpation/"rubbing" in the area, pins-and-needles affect and then sometimes when he is pulling heavy things or moving around he gets a cramp in that area, full evaluation by a back surgeon, his PCP and in the past with EGD and colonoscopy in 2021, everything has been unrevealing, new PCP requests repeat EGD  Plan: 1.  Scheduled patient for diagnostic EGD in the [facility] with Dr. 2022.  Did provide the patient a detailed list risks for the procedure and he agrees to proceed. Patient is appropriate for endoscopic procedure(s) in the ambulatory ([facility]) setting.  2.  I wonder if in the future patient may benefit from abdominal wall injections for pain.  This can be discussed if EGD is normal. 3.  Patient to follow in clinic per recommendations after time of procedure.  Rhea Belton, PA-C [facility] 09/05/2022, 3:20 PM  Cc: 09/07/2022, MD

## 2022-09-06 DIAGNOSIS — J301 Allergic rhinitis due to pollen: Secondary | ICD-10-CM | POA: Diagnosis not present

## 2022-09-07 NOTE — Progress Notes (Signed)
Addendum: Reviewed and agree with assessment and management plan. Aeralyn Barna M, MD  

## 2022-09-09 DIAGNOSIS — J301 Allergic rhinitis due to pollen: Secondary | ICD-10-CM | POA: Diagnosis not present

## 2022-09-16 DIAGNOSIS — J301 Allergic rhinitis due to pollen: Secondary | ICD-10-CM | POA: Diagnosis not present

## 2022-09-19 DIAGNOSIS — J301 Allergic rhinitis due to pollen: Secondary | ICD-10-CM | POA: Diagnosis not present

## 2022-09-23 DIAGNOSIS — J301 Allergic rhinitis due to pollen: Secondary | ICD-10-CM | POA: Diagnosis not present

## 2022-09-24 ENCOUNTER — Encounter: Payer: Self-pay | Admitting: Internal Medicine

## 2022-09-24 ENCOUNTER — Ambulatory Visit (AMBULATORY_SURGERY_CENTER): Payer: 59 | Admitting: Internal Medicine

## 2022-09-24 VITALS — BP 116/80 | HR 75 | Temp 98.6°F | Resp 13 | Ht 70.0 in | Wt 233.0 lb

## 2022-09-24 DIAGNOSIS — K3189 Other diseases of stomach and duodenum: Secondary | ICD-10-CM | POA: Diagnosis not present

## 2022-09-24 DIAGNOSIS — K219 Gastro-esophageal reflux disease without esophagitis: Secondary | ICD-10-CM

## 2022-09-24 DIAGNOSIS — R1013 Epigastric pain: Secondary | ICD-10-CM

## 2022-09-24 MED ORDER — SODIUM CHLORIDE 0.9 % IV SOLN: 500.0000 mL | Freq: Once | INTRAVENOUS | Status: DC

## 2022-09-24 NOTE — Progress Notes (Signed)
Pt's states no medical or surgical changes since previsit or office visit. 

## 2022-09-24 NOTE — Progress Notes (Signed)
See office note dated 09/05/2022 for details and current H&P  Patient presenting for repeat upper endoscopy to evaluate epigastric pain  He remains appropriate for upper endoscopy in the [facility] today.

## 2022-09-24 NOTE — Op Note (Signed)
[facility] Patient Name: Walter Edwards Procedure Date: 09/24/2022 9:28 AM MRN: [id number] Endoscopist: Jerene Bears , MD, [id number] Age: 51 Referring MD:  Date of Birth: 11-16-1970 Gender: Male Account #: [id number] Procedure:                Upper GI endoscopy Indications:              Epigastric abdominal pain, Abdominal pain in the                            right upper quadrant -- pain chronic and remains                            unexplained (constipation better with Amitiza,                            MRI/MRCP neg); currently on daily pantoprazole 40                            mg daily Medicines:                Monitored Anesthesia Care Procedure:                Pre-Anesthesia Assessment:                           - Prior to the procedure, a History and Physical                            was performed, and patient medications and                            allergies were reviewed. The patient's tolerance of                            previous anesthesia was also reviewed. The risks                            and benefits of the procedure and the sedation                            options and risks were discussed with the patient.                            All questions were answered, and informed consent                            was obtained. Prior Anticoagulants: The patient has                            taken no anticoagulant or antiplatelet agents. ASA                            Grade Assessment: II - A patient with mild systemic  disease. After reviewing the risks and benefits,                            the patient was deemed in satisfactory condition to                            undergo the procedure.                           After obtaining informed consent, the endoscope was                            passed under direct vision. Throughout the                            procedure, the patient's blood pressure, pulse, and                             oxygen saturations were monitored continuously. The                            GIF [id number] was introduced through the                            mouth, and advanced to the second part of duodenum.                            The upper GI endoscopy was accomplished without                            difficulty. The patient tolerated the procedure                            well. Scope In: Scope Out: Findings:                 The examined esophagus was normal. Biopsies were                            taken with a cold forceps for histology to exclude                            reflux inflammation.                           Diffuse mild inflammation characterized by erythema                            and granularity was found in the entire examined                            stomach. Biopsies were taken with a cold forceps                            for histology and Helicobacter pylori testing.  The examined duodenum was normal. Biopsies were                            taken with a cold forceps for histology. Complications:            No immediate complications. Estimated Blood Loss:     Estimated blood loss was minimal. Impression:               - Normal esophagus. Biopsied.                           - Mild diffuse gastritis. Biopsied.                           - Normal examined duodenum. Biopsied. Recommendation:           - Patient has a contact number available for                            emergencies. The signs and symptoms of potential                            delayed complications were discussed with the                            patient. Return to normal activities tomorrow.                            Written discharge instructions were provided to the                            patient.                           - Resume previous diet.                           - Continue present medications.                           -  Await pathology results. Beverley Fiedler, MD 09/24/2022 9:52:58 AM This report has been signed electronically.

## 2022-09-24 NOTE — Progress Notes (Signed)
A and O x3. Report to RN. Tolerated MAC anesthesia well.Teeth unchanged after procedure. 

## 2022-09-24 NOTE — Patient Instructions (Addendum)
-   Resume previous diet. - Continue present medications. - Await pathology results. - Handouts given on findings (gastritis)  YOU HAD AN ENDOSCOPIC PROCEDURE TODAY AT THE  ENDOSCOPY CENTER:   Refer to the procedure report that was given to you for any specific questions about what was found during the examination.  If the procedure report does not answer your questions, please call your gastroenterologist to clarify.  If you requested that your care partner not be given the details of your procedure findings, then the procedure report has been included in a sealed envelope for you to review at your convenience later.  YOU SHOULD EXPECT: Some feelings of bloating in the abdomen. Passage of more gas than usual.  Walking can help get rid of the air that was put into your GI tract during the procedure and reduce the bloating. If you had a lower endoscopy (such as a colonoscopy or flexible sigmoidoscopy) you may notice spotting of blood in your stool or on the toilet paper. If you underwent a bowel prep for your procedure, you may not have a normal bowel movement for a few days.  Please Note:  You might notice some irritation and congestion in your nose or some drainage.  This is from the oxygen used during your procedure.  There is no need for concern and it should clear up in a day or so.  SYMPTOMS TO REPORT IMMEDIATELY:   Following upper endoscopy (EGD)  Vomiting of blood or coffee ground material  New chest pain or pain under the shoulder blades  Painful or persistently difficult swallowing  New shortness of breath  Fever of 100F or higher  Black, tarry-looking stools  For urgent or emergent issues, a gastroenterologist can be reached at any hour by calling [phone]. Do not use MyChart messaging for urgent concerns.    DIET:  We do recommend a small meal at first, but then you may proceed to your regular diet.  Drink plenty of fluids but you should avoid alcoholic beverages for  24 hours.  ACTIVITY:  You should plan to take it easy for the rest of today and you should NOT DRIVE or use heavy machinery until tomorrow (because of the sedation medicines used during the test).    FOLLOW UP: Our staff will call the number listed on your records the next business day following your procedure.  We will call around 7:15- 8:00 am to check on you and address any questions or concerns that you may have regarding the information given to you following your procedure. If we do not reach you, we will leave a message.     If any biopsies were taken you will be contacted by phone or by letter within the next 1-3 weeks.  Please call us at [phone] if you have not heard about the biopsies in 3 weeks.    SIGNATURES/CONFIDENTIALITY: You and/or your care partner have signed paperwork which will be entered into your electronic medical record.  These signatures attest to the fact that that the information above on your After Visit Summary has been reviewed and is understood.  Full responsibility of the confidentiality of this discharge information lies with you and/or your care-partner.

## 2022-09-24 NOTE — Progress Notes (Signed)
Called to room to assist during endoscopic procedure.  Patient ID and intended procedure confirmed with present staff. Received instructions for my participation in the procedure from the performing physician.  

## 2022-09-25 ENCOUNTER — Telehealth: Payer: Self-pay | Admitting: *Deleted

## 2022-09-25 NOTE — Telephone Encounter (Signed)
  Follow up Call-     09/24/2022    8:55 AM  Call back number  Post procedure Call Back phone  # [phone]  Permission to leave phone message Yes     Patient questions:  Do you have a fever, pain , or abdominal swelling? No. Pain Score  0 *  Have you tolerated food without any problems? yes  Have you been able to return to your normal activities? yes  Do you have any questions about your discharge instructions: Diet   No. Medications  No. Follow up visit  No.  Do you have questions or concerns about your Care? No.  Actions: * If pain score is 4 or above: No action needed, pain <4.

## 2022-10-02 DIAGNOSIS — J301 Allergic rhinitis due to pollen: Secondary | ICD-10-CM | POA: Diagnosis not present

## 2022-10-09 DIAGNOSIS — J301 Allergic rhinitis due to pollen: Secondary | ICD-10-CM | POA: Diagnosis not present

## 2022-10-16 DIAGNOSIS — J301 Allergic rhinitis due to pollen: Secondary | ICD-10-CM | POA: Diagnosis not present

## 2022-10-23 DIAGNOSIS — J301 Allergic rhinitis due to pollen: Secondary | ICD-10-CM | POA: Diagnosis not present

## 2022-10-30 DIAGNOSIS — J301 Allergic rhinitis due to pollen: Secondary | ICD-10-CM | POA: Diagnosis not present

## 2022-11-01 DIAGNOSIS — M545 Low back pain, unspecified: Secondary | ICD-10-CM | POA: Diagnosis not present

## 2022-11-01 DIAGNOSIS — K760 Fatty (change of) liver, not elsewhere classified: Secondary | ICD-10-CM | POA: Diagnosis not present

## 2022-11-01 DIAGNOSIS — J301 Allergic rhinitis due to pollen: Secondary | ICD-10-CM | POA: Diagnosis not present

## 2022-11-01 DIAGNOSIS — I10 Essential (primary) hypertension: Secondary | ICD-10-CM | POA: Diagnosis not present

## 2022-11-01 DIAGNOSIS — G8929 Other chronic pain: Secondary | ICD-10-CM | POA: Diagnosis not present

## 2022-11-01 DIAGNOSIS — Z6830 Body mass index (BMI) 30.0-30.9, adult: Secondary | ICD-10-CM | POA: Diagnosis not present

## 2022-11-01 DIAGNOSIS — K219 Gastro-esophageal reflux disease without esophagitis: Secondary | ICD-10-CM | POA: Diagnosis not present

## 2022-11-04 ENCOUNTER — Other Ambulatory Visit: Payer: Self-pay | Admitting: Internal Medicine

## 2022-11-05 ENCOUNTER — Encounter: Payer: Self-pay | Admitting: Gastroenterology

## 2022-11-05 ENCOUNTER — Ambulatory Visit (INDEPENDENT_AMBULATORY_CARE_PROVIDER_SITE_OTHER): Payer: 59 | Admitting: Gastroenterology

## 2022-11-05 VITALS — BP 120/86 | HR 102 | Ht 70.0 in | Wt 229.1 lb

## 2022-11-05 DIAGNOSIS — R1011 Right upper quadrant pain: Secondary | ICD-10-CM

## 2022-11-05 NOTE — Patient Instructions (Addendum)
Follow up as needed.  If you are age 52 or older, your body mass index should be between 23-30. Your Body mass index is 32.88 kg/m. If this is out of the aforementioned range listed, please consider follow up with your Primary Care Provider.  If you are age 20 or younger, your body mass index should be between 19-25. Your Body mass index is 32.88 kg/m. If this is out of the aformentioned range listed, please consider follow up with your Primary Care Provider.   __________________________________________________________  The [facility] providers would like to encourage you to use [facility] to communicate with providers for non-urgent requests or questions.  Due to long hold times on the telephone, sending your provider a message by [facility] may be a faster and more efficient way to get a response.  Please allow 48 business hours for a response.  Please remember that this is for non-urgent requests.    Due to recent changes in healthcare laws, you may see the results of your imaging and laboratory studies on MyChart before your provider has had a chance to review them.  We understand that in some cases there may be results that are confusing or concerning to you. Not all laboratory results come back in the same time frame and the provider may be waiting for multiple results in order to interpret others.  Please give [place] 48 hours in order for your provider to thoroughly review all the results before contacting the office for clarification of your results.     Thank you for choosing me and [facility].  Vito Cirigliano, D.O.

## 2022-11-05 NOTE — Progress Notes (Signed)
Chief Complaint:    Abdominal pain  Referring physician: Dr. 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   He has had epigastric/RUQ pain for ~4 years, with extensive evaluation to date, to include EGD x 2, Colonoscopy, VCE, ultrasound, CT x 2, HIDA, MRI/MRCP, EKG.  Underwent ccy in 2021 and hernia repair prior to that and still no change in symptoms.  Has been evaluated by his PCP, spine surgeon and trialed Elavil, baclofen, gabapentin, PT (for back issues after injury on the job), dry needling, without much improvement significant.  He describes recurrent pain, particulalry with activity, wearing heavy gear, carrying hoses, etc.  Pain essentially starts in the back and radiates around right rib cage and into RUQ and epigastrium.  RUQ/epigastric pain not reproducible by palpation. No a/w PO intake, no n/v.   Most recently underwent EGD on 09/24/2022: Normal esophagus and duodenum, mild nonulcer gastritis   Review of systems:     No chest pain, no SOB, no fevers, no urinary sx   Past Medical History:  Diagnosis Date   Allergy    Chronic back pain    Gastritis    GERD (gastroesophageal reflux disease)    History of kidney stones    Hyperlipidemia    Hypertension    Inguinal hernia    Internal hemorrhoids    Low HDL (under 40)    Mild depression    Otitis media    Seasonal rhinitis     Patient's surgical history, family medical history, social history, medications and allergies were all reviewed in Epic    Current Outpatient Medications  Medication Sig Dispense Refill   amitriptyline (ELAVIL) 25 MG tablet Take 25 mg by mouth at bedtime.     amLODipine (NORVASC) 10 MG tablet Take 1 tablet (10 mg total) by mouth daily. 90 tablet 3   cyclobenzaprine (FLEXERIL) 10 MG tablet Take 10 mg by mouth daily as needed (Back pain).      dicyclomine (BENTYL) 10 MG capsule  Take 10 mg by mouth every 6 (six) hours as needed.     diphenhydrAMINE (BENADRYL) 25 MG tablet Take 25 mg by mouth See admin instructions. Takes with hydrocodone to prevent itching as needed     docusate sodium (COLACE) 250 MG capsule Take 500 mg by mouth daily after lunch.     EPINEPHrine 0.3 mg/0.3 mL IJ SOAJ injection Inject 0.3 mg into the muscle as needed.     fexofenadine (ALLEGRA) 180 MG tablet Take 1 tablet (180 mg total) by mouth daily. 90 tablet 3   gabapentin (NEURONTIN) 300 MG capsule Take 300 mg by mouth 3 (three) times daily. (Patient not taking: Reported on 09/05/2022)     HYDROcodone-acetaminophen (NORCO) 10-325 MG tablet Take by mouth.     ibuprofen (ADVIL) 800 MG tablet Take 1 tablet by mouth 3 (three) times daily as needed.     lubiprostone (AMITIZA) 24 MCG capsule TAKE 1 CAPSULE BY MOUTH TWICE DAILY WITHA MEAL 60 capsule 3   montelukast (SINGULAIR) 10 MG tablet TAKE 1 TABLET BY MOUTH EVERY MORNING FORALLERGIES 90 tablet 3   Multiple Vitamin (MULTIVITAMIN) tablet Take 1 tablet by mouth daily.     pantoprazole (PROTONIX) 40 MG tablet TAKE 1 TABLET BY MOUTH ONCE A DAY 90 tablet 3   polyethylene glycol powder (GLYCOLAX/MIRALAX) 17 GM/SCOOP powder Take 1 Scoop by mouth 2 (  two) times daily. Clear lax (Patient not taking: Reported on 09/05/2022)     Sennosides-Docusate Sodium (EQ STOOL SOFTENER/LAXATIVE PO) Take 1 tablet by mouth daily.     tamsulosin (FLOMAX) 0.4 MG CAPS capsule Take 0.4 mg by mouth daily.     triamcinolone (NASACORT) 55 MCG/ACT AERO nasal inhaler Place 2 sprays into the nose daily. 16.5 g 5   Vit C-Quercet-Bioflv-Bromelain 450-250-125-50 MG CAPS Take 2 Doses by mouth 3 (three) times daily between meals. 42 capsule 0   No current facility-administered medications for this visit.    Physical Exam:     There were no vitals taken for this visit.  GENERAL:  Pleasant male in NAD PSYCH: : Cooperative, normal affect EENT:  conjunctiva pink, mucous membranes moist,  neck supple without masses CARDIAC:  RRR, no murmur heard, no peripheral edema PULM: Normal respiratory effort, lungs CTA bilaterally, no wheezing ABDOMEN:  Nondistended, soft, nontender. No obvious masses, no hepatomegaly,  normal bowel sounds.  No pinpoint for reproducible tenderness in the abdomen. - Carnett's sign. SKIN:  turgor, no lesions seen Musculoskeletal:  Normal muscle tone, normal strength NEURO: Alert and oriented x 3, no focal neurologic deficits   IMPRESSION and PLAN:    1) Abdominal pain His described pain is not consistent with anterior cutaneous nerve entrapment syndrome (abdominal wall syndrome) as it tends to start in the back and radiate around his right flank and into the RUQ/epigastrium.  Additionally, pain not reproducible with abdominal palpation and negative Carnett's sign.  We discussed the role/utility of abdominal wall injection with lidocaine and Kenalog today.  I have a high suspicion that this would not be therapeutic, and he does not wish to proceed but rather prefers reevaluation by spine surgeon.  He can follow-up with me as needed        Lavena Bullion ,DO, FACG 11/05/2022, 8:35 AM

## 2022-11-06 DIAGNOSIS — J301 Allergic rhinitis due to pollen: Secondary | ICD-10-CM | POA: Diagnosis not present

## 2022-11-13 DIAGNOSIS — J301 Allergic rhinitis due to pollen: Secondary | ICD-10-CM | POA: Diagnosis not present

## 2022-11-20 DIAGNOSIS — J301 Allergic rhinitis due to pollen: Secondary | ICD-10-CM | POA: Diagnosis not present

## 2022-11-27 DIAGNOSIS — J301 Allergic rhinitis due to pollen: Secondary | ICD-10-CM | POA: Diagnosis not present

## 2022-12-04 DIAGNOSIS — J301 Allergic rhinitis due to pollen: Secondary | ICD-10-CM | POA: Diagnosis not present

## 2022-12-06 ENCOUNTER — Ambulatory Visit (INDEPENDENT_AMBULATORY_CARE_PROVIDER_SITE_OTHER): Payer: 59 | Admitting: Podiatry

## 2022-12-06 ENCOUNTER — Encounter: Payer: Self-pay | Admitting: Podiatry

## 2022-12-06 VITALS — BP 153/91 | HR 75

## 2022-12-06 DIAGNOSIS — Q666 Other congenital valgus deformities of feet: Secondary | ICD-10-CM | POA: Diagnosis not present

## 2022-12-06 NOTE — Progress Notes (Signed)
Subjective:  Patient ID: Walter Edwards, male    DOB: 1970/10/28,  MRN: [id number]  Chief Complaint  Patient presents with   Foot Orthotics    "I'm here to be fitted for insoles."    52 y.o. male presents with the above complaint.  Patient presents with pes planovalgus deformity.  Patient states the plantar fasciitis is staying under control.  He states that his orthotics are starting to wear out he would like to due to pair of orthotics.  It helps him stabilize and gives him relief.   Review of Systems: Negative except as noted in the HPI. Denies N/V/F/Ch.  Past Medical History:  Diagnosis Date   Allergy    Chronic back pain    Gastritis    GERD (gastroesophageal reflux disease)    History of kidney stones    Hyperlipidemia    Hypertension    Inguinal hernia    Internal hemorrhoids    Low HDL (under 40)    Mild depression    Otitis media    Seasonal rhinitis     Current Outpatient Medications:    amitriptyline (ELAVIL) 25 MG tablet, Take 25 mg by mouth at bedtime., Disp: , Rfl:    amLODipine (NORVASC) 10 MG tablet, Take 1 tablet (10 mg total) by mouth daily., Disp: 90 tablet, Rfl: 3   cyclobenzaprine (FLEXERIL) 10 MG tablet, Take 10 mg by mouth daily as needed (Back pain). , Disp: , Rfl:    dicyclomine (BENTYL) 10 MG capsule, Take 10 mg by mouth every 6 (six) hours as needed., Disp: , Rfl:    diphenhydrAMINE (BENADRYL) 25 MG tablet, Take 25 mg by mouth See admin instructions. Takes with hydrocodone to prevent itching as needed, Disp: , Rfl:    docusate sodium (COLACE) 250 MG capsule, Take 500 mg by mouth daily after lunch., Disp: , Rfl:    EPINEPHrine 0.3 mg/0.3 mL IJ SOAJ injection, Inject 0.3 mg into the muscle as needed., Disp: , Rfl:    fexofenadine (ALLEGRA) 180 MG tablet, Take 1 tablet (180 mg total) by mouth daily., Disp: 90 tablet, Rfl: 3   HYDROcodone-acetaminophen (NORCO) 10-325 MG tablet, Take by mouth., Disp: , Rfl:    ibuprofen (ADVIL) 800 MG tablet, Take 1  tablet by mouth 3 (three) times daily as needed., Disp: , Rfl:    lubiprostone (AMITIZA) 24 MCG capsule, TAKE 1 CAPSULE BY MOUTH TWICE DAILY WITHA MEAL, Disp: 60 capsule, Rfl: 3   montelukast (SINGULAIR) 10 MG tablet, TAKE 1 TABLET BY MOUTH EVERY MORNING FORALLERGIES, Disp: 90 tablet, Rfl: 3   Multiple Vitamin (MULTIVITAMIN) tablet, Take 1 tablet by mouth daily., Disp: , Rfl:    pantoprazole (PROTONIX) 40 MG tablet, TAKE 1 TABLET BY MOUTH ONCE A DAY, Disp: 90 tablet, Rfl: 3   polyethylene glycol powder (GLYCOLAX/MIRALAX) 17 GM/SCOOP powder, Take 1 Scoop by mouth 2 (two) times daily. Clear lax, Disp: , Rfl:    Sennosides-Docusate Sodium (EQ STOOL SOFTENER/LAXATIVE PO), Take 1 tablet by mouth daily., Disp: , Rfl:    tamsulosin (FLOMAX) 0.4 MG CAPS capsule, Take 0.4 mg by mouth daily., Disp: , Rfl:    triamcinolone (NASACORT) 55 MCG/ACT AERO nasal inhaler, Place 2 sprays into the nose daily., Disp: 16.5 g, Rfl: 5   Vit C-Quercet-Bioflv-Bromelain 450-250-125-50 MG CAPS, Take 2 Doses by mouth 3 (three) times daily between meals., Disp: 42 capsule, Rfl: 0   gabapentin (NEURONTIN) 300 MG capsule, Take 300 mg by mouth 3 (three) times daily. (Patient not taking: Reported  on 09/05/2022), Disp: , Rfl:   Social History   Tobacco Use  Smoking Status Never  Smokeless Tobacco Former    Allergies  Allergen Reactions   Hydrocodone Itching   Tessalon Perles [Benzonatate] Rash    itching   Objective:   Vitals:   12/06/22 0928  BP: (!) 153/91  Pulse: 75   There is no height or weight on file to calculate BMI. Constitutional Well developed. Well nourished.  Vascular Dorsalis pedis pulses palpable bilaterally. Posterior tibial pulses palpable bilaterally. Capillary refill normal to all digits.  No cyanosis or clubbing noted. Pedal hair growth normal.  Neurologic Normal speech. Oriented to person, place, and time. Epicritic sensation to light touch grossly present bilaterally.  Dermatologic Nails  well groomed and normal in appearance. No open wounds. No skin lesions.  Orthopedic: Bilateral pes planovalgus gait noted.  With calcaneovalgus to many toe signs partially recruit the arch with dorsiflexion of the hallux.   Radiographs: None Assessment:   1. Pes planovalgus    Plan:  Patient was evaluated and treated and all questions answered.  Right  pes planovalgus deformity -Pes planovalgus -I explained to patient the etiology of pes planovalgus and relationship with Planter fasciitis and various treatment options were discussed.  Given patient foot structure in the setting of Planter fasciitis I believe patient will benefit from custom-made orthotics to help control the hindfoot motion support the arch of the foot and take the stress away from plantar fascial.  Patient agrees with the plan like to proceed with orthotics -Patient was casted for orthotics   No follow-ups on file.

## 2022-12-12 DIAGNOSIS — J301 Allergic rhinitis due to pollen: Secondary | ICD-10-CM | POA: Diagnosis not present

## 2022-12-17 ENCOUNTER — Telehealth: Payer: Self-pay | Admitting: Internal Medicine

## 2022-12-17 MED ORDER — LUBIPROSTONE 24 MCG PO CAPS: ORAL_CAPSULE | ORAL | 5 refills | Status: DC

## 2022-12-17 NOTE — Telephone Encounter (Signed)
Prescription for Walter Edwards has been sent to patient's pharmacy.

## 2022-12-17 NOTE — Telephone Encounter (Signed)
Inbound call from pharmacy requesting a refill for Lubiprostone 24 mcg .Please advise

## 2022-12-18 DIAGNOSIS — J301 Allergic rhinitis due to pollen: Secondary | ICD-10-CM | POA: Diagnosis not present

## 2022-12-18 MED ORDER — LUBIPROSTONE 24 MCG PO CAPS: ORAL_CAPSULE | ORAL | 1 refills | Status: DC

## 2022-12-18 NOTE — Telephone Encounter (Signed)
Prescription sent to [facility] for 90 day supply.

## 2022-12-18 NOTE — Telephone Encounter (Signed)
Patient called stating that the prescription needs to be sent to [facility]. Patient is requesting 3 month supply of  Amitiza. Please advise.

## 2022-12-18 NOTE — Addendum Note (Signed)
Addended by: Dorisann Frames L on: 12/18/2022 01:51 PM   Modules accepted: Orders

## 2022-12-25 DIAGNOSIS — J301 Allergic rhinitis due to pollen: Secondary | ICD-10-CM | POA: Diagnosis not present

## 2023-01-01 DIAGNOSIS — J301 Allergic rhinitis due to pollen: Secondary | ICD-10-CM | POA: Diagnosis not present

## 2023-01-08 DIAGNOSIS — J301 Allergic rhinitis due to pollen: Secondary | ICD-10-CM | POA: Diagnosis not present

## 2023-01-15 DIAGNOSIS — J301 Allergic rhinitis due to pollen: Secondary | ICD-10-CM | POA: Diagnosis not present

## 2023-01-21 DIAGNOSIS — J301 Allergic rhinitis due to pollen: Secondary | ICD-10-CM | POA: Diagnosis not present

## 2023-01-23 ENCOUNTER — Ambulatory Visit (INDEPENDENT_AMBULATORY_CARE_PROVIDER_SITE_OTHER): Payer: 59 | Admitting: *Deleted

## 2023-01-23 DIAGNOSIS — Q666 Other congenital valgus deformities of feet: Secondary | ICD-10-CM

## 2023-01-23 DIAGNOSIS — J301 Allergic rhinitis due to pollen: Secondary | ICD-10-CM | POA: Diagnosis not present

## 2023-01-23 NOTE — Progress Notes (Signed)
Patient presents today to pick up custom molded foot orthotics recommended by Dr. Posey Pronto.   Orthotics were dispensed and fit was satisfactory. Reviewed instructions for break-in and wear. Written instructions given to patient.  Patient originally ordered 2 pair but only one came in. Spoke with Gerald Stabs at the lab and he states that the note may have been missed. An email has been sent to [facility] lab to produce the second pair.

## 2023-01-27 DIAGNOSIS — K219 Gastro-esophageal reflux disease without esophagitis: Secondary | ICD-10-CM | POA: Diagnosis not present

## 2023-01-27 DIAGNOSIS — Z79899 Other long term (current) drug therapy: Secondary | ICD-10-CM | POA: Diagnosis not present

## 2023-01-27 DIAGNOSIS — M5440 Lumbago with sciatica, unspecified side: Secondary | ICD-10-CM | POA: Diagnosis not present

## 2023-01-27 DIAGNOSIS — I10 Essential (primary) hypertension: Secondary | ICD-10-CM | POA: Diagnosis not present

## 2023-01-27 DIAGNOSIS — R1010 Upper abdominal pain, unspecified: Secondary | ICD-10-CM | POA: Diagnosis not present

## 2023-01-27 DIAGNOSIS — G8929 Other chronic pain: Secondary | ICD-10-CM | POA: Diagnosis not present

## 2023-01-28 ENCOUNTER — Other Ambulatory Visit: Payer: Self-pay | Admitting: Internal Medicine

## 2023-01-28 DIAGNOSIS — R1010 Upper abdominal pain, unspecified: Secondary | ICD-10-CM

## 2023-01-29 ENCOUNTER — Ambulatory Visit
Admission: RE | Admit: 2023-01-29 | Discharge: 2023-01-29 | Disposition: A | Discharge status: Home / Self Care | Payer: 59 | Source: Ambulatory Visit | Attending: Internal Medicine | Admitting: Internal Medicine

## 2023-01-29 DIAGNOSIS — Z9049 Acquired absence of other specified parts of digestive tract: Secondary | ICD-10-CM | POA: Diagnosis not present

## 2023-01-29 DIAGNOSIS — R1010 Upper abdominal pain, unspecified: Secondary | ICD-10-CM | POA: Insufficient documentation

## 2023-01-29 DIAGNOSIS — J301 Allergic rhinitis due to pollen: Secondary | ICD-10-CM | POA: Diagnosis not present

## 2023-01-29 MED ORDER — GADOBUTROL 1 MMOL/ML IV SOLN
10.0000 mL | Freq: Once | INTRAVENOUS | Status: AC | PRN
  Administered 2023-01-29: 10 mL via INTRAVENOUS

## 2023-02-03 ENCOUNTER — Other Ambulatory Visit: Payer: 59

## 2023-02-03 ENCOUNTER — Other Ambulatory Visit: Payer: Self-pay | Admitting: *Deleted

## 2023-02-03 DIAGNOSIS — R972 Elevated prostate specific antigen [PSA]: Secondary | ICD-10-CM

## 2023-02-04 ENCOUNTER — Telehealth: Payer: Self-pay | Admitting: Gastroenterology

## 2023-02-04 DIAGNOSIS — R1011 Right upper quadrant pain: Secondary | ICD-10-CM

## 2023-02-04 LAB — PSA: Prostate Specific Ag, Serum: 3.8 ng/mL (ref 0.0–4.0)

## 2023-02-04 NOTE — Telephone Encounter (Signed)
Inbound call from patient would like to discuss, abdominal wall injections as discussed at his last OV with Dr. Barron Alvine. Please advise further.

## 2023-02-05 ENCOUNTER — Ambulatory Visit (INDEPENDENT_AMBULATORY_CARE_PROVIDER_SITE_OTHER): Payer: 59 | Admitting: Urology

## 2023-02-05 ENCOUNTER — Encounter: Payer: Self-pay | Admitting: Urology

## 2023-02-05 VITALS — BP 127/86 | HR 99 | Ht 70.0 in | Wt 225.0 lb

## 2023-02-05 DIAGNOSIS — R972 Elevated prostate specific antigen [PSA]: Secondary | ICD-10-CM

## 2023-02-05 DIAGNOSIS — J301 Allergic rhinitis due to pollen: Secondary | ICD-10-CM | POA: Diagnosis not present

## 2023-02-05 DIAGNOSIS — Z87898 Personal history of other specified conditions: Secondary | ICD-10-CM

## 2023-02-05 NOTE — Progress Notes (Signed)
I, DeAsia L Maxie,acting as a scribe for Riki Altes, MD.,have documented all relevant documentation on the behalf of Riki Altes, MD,as directed by  Riki Altes, MD while in the presence of Riki Altes, MD.   02/05/23 1:18 PM   [name] [birth year] [id number]  Referring provider: Marguarite Arbour, MD [address] [facility] [place],  [place] [address]  Chief Complaint  Patient presents with   Elevated PSA    HPI: Walter Edwards is a 52 y.o. male who presents for 6 month follow-up for an elevated PSA.  Initially seen 07/31/22 for a PSA of 4.89; underwent prostate biopsy at [facility] in [place] for a PSA of 4.6 with benign pathology.  A repeat PSA October 2023 was 4.3 PSA 02/03/23 3.8 No bothersome LUTs and no significant changes since his last visit. Denies dysuria, gross hematuria Denies flank, abdominal or pelvic pain    PMH: Past Medical History:  Diagnosis Date   Allergy    Chronic back pain    Gastritis    GERD (gastroesophageal reflux disease)    History of kidney stones    Hyperlipidemia    Hypertension    Inguinal hernia    Internal hemorrhoids    Low HDL (under 40)    Mild depression    Otitis media    Seasonal rhinitis     Surgical History: Past Surgical History:  Procedure Laterality Date   BACK SURGERY     x3   CHOLECYSTECTOMY  06/02/2020   CHOLECYSTECTOMY N/A 06/02/2020   Procedure: LAPAROSCOPIC CHOLECYSTECTOMY;  Surgeon: [name], MD;  Location: [facility] OR;  Service: General;  Laterality: N/A;   COLONOSCOPY     ENDOSCOPIC PLANTAR FASCIOTOMY  05/23/2020   VENTRAL HERNIA REPAIR N/A 07/17/2021   Procedure: Caren Macadam HERNIA REPAIR;  Surgeon: [name], MD;  Location: [facility];  Service: General;  Laterality: N/A;    Home Medications:  Allergies as of 02/05/2023       Reactions   Hydrocodone Itching   Tessalon Perles [benzonatate] Rash   itching        Medication List         Accurate as of February 05, 2023  1:18 PM. If you have any questions, ask your nurse or doctor.          STOP taking these medications    gabapentin 300 MG capsule Commonly known as: NEURONTIN Stopped by: Riki Altes, MD       TAKE these medications    amitriptyline 25 MG tablet Commonly known as: ELAVIL Take 25 mg by mouth at bedtime.   amLODipine 10 MG tablet Commonly known as: NORVASC Take 1 tablet (10 mg total) by mouth daily.   cyclobenzaprine 10 MG tablet Commonly known as: FLEXERIL Take 10 mg by mouth daily as needed (Back pain).   dicyclomine 10 MG capsule Commonly known as: BENTYL Take 10 mg by mouth every 6 (six) hours as needed.   diphenhydrAMINE 25 MG tablet Commonly known as: BENADRYL Take 25 mg by mouth See admin instructions. Takes with hydrocodone to prevent itching as needed   docusate sodium 250 MG capsule Commonly known as: COLACE Take 500 mg by mouth daily after lunch.   DULoxetine 30 MG capsule Commonly known as: CYMBALTA Take 30 mg by mouth daily.   EPINEPHrine 0.3 mg/0.3 mL Soaj injection Commonly known as: EPI-PEN Inject 0.3 mg into the muscle as needed.   EQ STOOL SOFTENER/LAXATIVE  PO Take 1 tablet by mouth daily.   fexofenadine 180 MG tablet Commonly known as: ALLEGRA Take 1 tablet (180 mg total) by mouth daily.   HYDROcodone-acetaminophen 10-325 MG tablet Commonly known as: NORCO Take by mouth.   ibuprofen 800 MG tablet Commonly known as: ADVIL Take 1 tablet by mouth 3 (three) times daily as needed.   lubiprostone 24 MCG capsule Commonly known as: AMITIZA TAKE 1 CAPSULE BY MOUTH TWICE DAILY WITHA MEAL   montelukast 10 MG tablet Commonly known as: SINGULAIR TAKE 1 TABLET BY MOUTH EVERY MORNING FORALLERGIES   multivitamin tablet Take 1 tablet by mouth daily.   pantoprazole 40 MG tablet Commonly known as: PROTONIX TAKE 1 TABLET BY MOUTH ONCE A DAY   polyethylene glycol powder 17 GM/SCOOP powder Commonly known as:  GLYCOLAX/MIRALAX Take 1 Scoop by mouth 2 (two) times daily. Clear lax   tamsulosin 0.4 MG Caps capsule Commonly known as: FLOMAX Take 0.4 mg by mouth daily.   triamcinolone 55 MCG/ACT Aero nasal inhaler Commonly known as: NASACORT Place 2 sprays into the nose daily.   Vit C-Quercet-Bioflv-Bromelain 450-250-125-50 MG Caps Take 2 Doses by mouth 3 (three) times daily between meals.        Allergies:  Allergies  Allergen Reactions   Hydrocodone Itching   Tessalon Perles [Benzonatate] Rash    itching    Family History: Family History  Problem Relation Age of Onset   Diabetes Mother    Anxiety disorder Mother    Hypertension Mother    Diabetes Father    Hypertension Father    Obesity Sister    Hypertension Sister    Colon cancer Neg Hx    Pancreatic cancer Neg Hx    Esophageal cancer Neg Hx    Liver cancer Neg Hx     Social History:  reports that he has never smoked. He has quit using smokeless tobacco. He reports current alcohol use. He reports that he does not use drugs.   Physical Exam: BP 127/86   Pulse 99   Ht  (1.778 m)   Wt 225 lb (102.1 kg)   BMI 32.28 kg/m   Constitutional:  Alert and oriented, No acute distress. HEENT:  AT, moist mucus membranes.  Trachea midline, no masses. Cardiovascular: No clubbing, cyanosis, or edema. Respiratory: Normal respiratory effort, no increased work of breathing. GI: Abdomen is soft, nontender, nondistended, no abdominal masses Skin: No rashes, bruises or suspicious lesions. Neurologic: Grossly intact, no focal deficits, moving all 4 extremities. Psychiatric: Normal mood and affect.  Laboratory Data: Lab Results  Component Value Date   WBC 10.7 (H) 05/30/2020   HGB 16.5 05/30/2020   HCT 48.6 05/30/2020   MCV 90.7 05/30/2020   PLT 309 05/30/2020    Lab Results  Component Value Date   CREATININE 1.06 05/10/2021    Lab Results  Component Value Date   PSA 3.78 05/10/2021    Lab Results  Component  Value Date   HGBA1C 5.4 04/27/2020    Urinalysis    Component Value Date/Time   BILIRUBINUR negative 07/16/2022 0849   PROTEINUR Negative 07/16/2022 0849   UROBILINOGEN 0.2 07/16/2022 0849   NITRITE negative 07/16/2022 0849   LEUKOCYTESUR Negative 07/16/2022 0849     Assessment & Plan:    Elevated PSA His last two PSAs have been below baseline with his most recent PSA normal at 3.8. He gets an annual PSA with the city every August and we'll see him back in mid September 2024. His  annual visits will coincide with his PSA draw   [facility] [address] [place], [place] [address] [phone]

## 2023-02-05 NOTE — Telephone Encounter (Signed)
Lm on vm for patient to return call 

## 2023-02-11 NOTE — Telephone Encounter (Signed)
Pt states he is still having discomfort and would like to try the abd wall inj but he needs it done prior to July. Please advise regarding scheduling, if you can fit him in prior to July.

## 2023-02-12 DIAGNOSIS — J301 Allergic rhinitis due to pollen: Secondary | ICD-10-CM | POA: Diagnosis not present

## 2023-02-12 NOTE — Telephone Encounter (Signed)
Spoke with pt and he is aware of Dr. Frankey Shown recommendations. Referral made to sports medicine.

## 2023-02-20 ENCOUNTER — Ambulatory Visit (INDEPENDENT_AMBULATORY_CARE_PROVIDER_SITE_OTHER): Payer: 59 | Admitting: Orthopedic Surgery

## 2023-02-20 ENCOUNTER — Encounter: Payer: Self-pay | Admitting: Orthopedic Surgery

## 2023-02-20 ENCOUNTER — Other Ambulatory Visit (INDEPENDENT_AMBULATORY_CARE_PROVIDER_SITE_OTHER): Payer: 59

## 2023-02-20 VITALS — BP 142/93 | HR 97 | Ht 70.0 in | Wt 234.0 lb

## 2023-02-20 DIAGNOSIS — M5440 Lumbago with sciatica, unspecified side: Secondary | ICD-10-CM

## 2023-02-20 DIAGNOSIS — M5414 Radiculopathy, thoracic region: Secondary | ICD-10-CM

## 2023-02-20 NOTE — Progress Notes (Signed)
Orthopedic Spine Surgery Office Note  Assessment: Patient is a 52 y.o. male with lower thoracic back pain that radiates into his chest wall to the midline.  It does this on the right side.  Possible chronic, progressive thoracic radiculopathy   Plan: -He has had MRI of his thoracic spine, lumbar spine, and abdomen that did not show any abnormalities per the reports.  Recommended a EMG/NCS of the abdominals and paraspinals to evaluate further for thoracic radiculopathy -Patient has tried PT, NSAIDs, Tylenol, steroid injections, narcotics -Patient should return to office in 5 weeks, x-rays at next visit: none   Patient expressed understanding of the plan and all questions were answered to the patient's satisfaction.   ___________________________________________________________________________   History:  Patient is a 52 y.o. male who presents today for thoracic spine.  Patient states that about 4 years ago he developed lower thoracic back pain that radiates into his anterior chest wall.  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  Initially the pain was just with wearing heavy gear at work.  The pain has progressed to the point that now he has the pain even doing simple things like grocery shopping.  He states it is worse if he flexes the spine and he does get complete relief of his pain if he lays flat.  He had previous lumbar surgery with Dr. Lovell Sheehan on his lumbar spine and had seen him several times with this symptom.  He had gotten a thoracic MRI that did not show any herniated disks or foraminal stenosis.  He was told that it may be coming from his abdomen so he was referred to gastroenterology.  He underwent a cholecystectomy and then a ventral hernia repair.  He also has had colonoscopy and EGD that did not show any intra-abdominal pathology.  He is frustrated because he has this pain on a daily basis now and it is limiting his ability to do even  simple tasks like grocery shopping.  He comes in today for another opinion because so far he has tried several things but the pain has not relented.  He points to it radiating around the umbilicus and a T10 or T11 distribution  Weakness: Denies Symptoms of imbalance: Denies Paresthesias and numbness: Denies Bowel or bladder incontinence: Denies Saddle anesthesia: Denies  Treatments tried: PT, NSAIDs, Tylenol, steroid injections, narcotics  Review of systems: Denies fevers and chills, night sweats, unexplained weight loss, history of cancer.  Has had pain that wakes him at night  Past medical history: Chronic back pain GERD HLD Internal hemorrhoids Seasonal rhinitis  Allergies: hydrocodone, tessalon perles  Past surgical history:  L5/S1 discectomy, revision discectomy x2 L5/S1 TLIF and PSIF Cholecystectomy Ventral hernia repair Left plantar fasciotomy  Social history: Denies use of nicotine product (smoking, vaping, patches, smokeless) Alcohol use: Yes, 1-2 drinks per week Denies recreational drug use Works as a Investment banker, corporate Exam:  General: no acute distress, appears stated age Neurologic: alert, answering questions appropriately, following commands Respiratory: unlabored breathing on room air, symmetric chest rise Psychiatric: appropriate affect, normal cadence to speech   MSK (spine):  -Strength exam      Left  Right EHL    5/5  5/5 TA    5/5  5/5 GSC    5/5  5/5 Knee extension  5/5  5/5 Hip flexion   5/5  5/5  -Sensory exam    Sensation intact to light touch in L3-S1  nerve distributions of bilateral lower extremities  -Achilles DTR: 2/4 on the left, 2/4 on the right -Patellar tendon DTR: 2/4 on the left, 2/4 on the right  -Straight leg raise: Negative bilaterally -Femoral nerve stretch test: Negative bilaterally -Clonus: no beats bilaterally  -Left hip exam: No pain through range of motion, negative Stinchfield, negative FABER -Right  hip exam: No pain through range of motion, negative Stinchfield, negative FABER  Imaging: XR of the thoracic spine from 02/20/2023 was independently reviewed and interpreted, showing no fracture or dislocation.  No scoliosis seen.  Physiologic kyphosis.  No significant disc height loss seen.   Patient name: Walter Edwards Patient MRN: [id number] Date of visit: 02/20/23

## 2023-02-21 ENCOUNTER — Encounter: Payer: Self-pay | Admitting: Neurology

## 2023-02-21 ENCOUNTER — Encounter: Payer: Self-pay | Admitting: Orthopedic Surgery

## 2023-02-21 ENCOUNTER — Other Ambulatory Visit: Payer: Self-pay

## 2023-02-21 DIAGNOSIS — R202 Paresthesia of skin: Secondary | ICD-10-CM

## 2023-02-26 DIAGNOSIS — J301 Allergic rhinitis due to pollen: Secondary | ICD-10-CM | POA: Diagnosis not present

## 2023-03-05 DIAGNOSIS — J301 Allergic rhinitis due to pollen: Secondary | ICD-10-CM | POA: Diagnosis not present

## 2023-03-11 NOTE — Progress Notes (Unsigned)
[facility] [address] [place] [address] Phone: [phone] Subjective:   Bruce Donath, am serving as a scribe for Dr. Antoine Primas.  I'm seeing this patient by the request  of:  Marguarite Arbour, MD  CC: back pain follow up   [id number]  Walter Edwards is a 52 y.o. male coming in with complaint of RUQ and back pain for past four years. Has seen many doctors over the years. Patient states that he had pain when at work after he was unable to get fire hydrant tool to release hydrant. It finally release and he fell to ground in lumbar flexed position. Saw workman's comp doctor. Had gall bladder out after having scans. Also had hernia surgery. Did PT and was released due to not making progress and PT thought something was wrong with his back due to pinching and cramping that occurs when he performs lumbar flexion Surgeries at L5/S1 (2 discectomies, 1 fusion). Also notes pain that radiates down posterior aspect of L leg to the medial aspect of foot. Hx of back fusion. Sees Dr. Lovell Sheehan at [facility]. Referred by Dr. Rhea Belton. Future nerve test scheduled by Dr. Christell Constant.     Past Medical History:  Diagnosis Date   Allergy    Chronic back pain    Gastritis    GERD (gastroesophageal reflux disease)    History of kidney stones    Hyperlipidemia    Hypertension    Inguinal hernia    Internal hemorrhoids    Low HDL (under 40)    Mild depression    Otitis media    Seasonal rhinitis    Past Surgical History:  Procedure Laterality Date   BACK SURGERY     x3   CHOLECYSTECTOMY  06/02/2020   CHOLECYSTECTOMY N/A 06/02/2020   Procedure: LAPAROSCOPIC CHOLECYSTECTOMY;  Surgeon: [name], MD;  Location: [facility] OR;  Service: General;  Laterality: N/A;   COLONOSCOPY     ENDOSCOPIC PLANTAR FASCIOTOMY  05/23/2020   VENTRAL HERNIA REPAIR N/A 07/17/2021   Procedure: VENTRA HERNIA REPAIR;  Surgeon: [name], MD;  Location: [facility];  Service: General;  Laterality: N/A;   Social History   Socioeconomic History   Marital status: Single    Spouse name: Not on file   Number of children: 1   Years of education: [employer] education level: Not on file  Occupational History   Not on file  Tobacco Use   Smoking status: Never   Smokeless tobacco: Former  Building services engineer Use: Never used  Substance and Sexual Activity   Alcohol use: Yes    Comment: on the weekends maybe   Drug use: Never   Sexual activity: Yes    Birth control/protection: Condom  Other Topics Concern   Not on file  Social History Narrative   11/11/19   From: the area   Living: alone   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 [place]      Family: 1 daughter - Ladona Ridgel - 2003      Enjoys: relax, outdoor activity, hiking, concerts      Exercise: hiking, 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, 9-10K steps per day   Diet: had done low carb, but not doing great now, meat/veggies/fruit      Safety   Seat belts: Yes    Guns: Yes  and secure   Safe in relationships: Yes  Social Determinants of Health   Financial Resource Strain: Not on file  Food Insecurity: Not on file  Transportation Needs: Not on file  Physical Activity: Not on file  Stress: Not on file  Social Connections: Not on file   Allergies  Allergen Reactions   Hydrocodone Itching   Tessalon Perles [Benzonatate] Rash    itching   Family History  Problem Relation Age of Onset   Diabetes Mother    Anxiety disorder Mother    Hypertension Mother    Diabetes Father    Hypertension Father    Obesity Sister    Hypertension Sister    Colon cancer Neg Hx    Pancreatic cancer Neg Hx    Esophageal cancer Neg Hx    Liver cancer Neg Hx      Current Outpatient Medications (Cardiovascular):    amLODipine (NORVASC) 10 MG tablet, Take 1 tablet (10 mg total) by mouth daily.   EPINEPHrine 0.3 mg/0.3 mL IJ SOAJ injection, Inject 0.3 mg into  the muscle as needed.  Current Outpatient Medications (Respiratory):    diphenhydrAMINE (BENADRYL) 25 MG tablet, Take 25 mg by mouth See admin instructions. Takes with hydrocodone to prevent itching as needed   fexofenadine (ALLEGRA) 180 MG tablet, Take 1 tablet (180 mg total) by mouth daily.   montelukast (SINGULAIR) 10 MG tablet, TAKE 1 TABLET BY MOUTH EVERY MORNING FORALLERGIES   triamcinolone (NASACORT) 55 MCG/ACT AERO nasal inhaler, Place 2 sprays into the nose daily.  Current Outpatient Medications (Analgesics):    HYDROcodone-acetaminophen (NORCO) 10-325 MG tablet, Take by mouth.   ibuprofen (ADVIL) 800 MG tablet, Take 1 tablet by mouth 3 (three) times daily as needed.   Current Outpatient Medications (Other):    amitriptyline (ELAVIL) 25 MG tablet, Take 25 mg by mouth at bedtime.   cyclobenzaprine (FLEXERIL) 10 MG tablet, Take 10 mg by mouth daily as needed (Back pain).    dicyclomine (BENTYL) 10 MG capsule, Take 10 mg by mouth every 6 (six) hours as needed.   docusate sodium (COLACE) 250 MG capsule, Take 500 mg by mouth daily after lunch.   DULoxetine (CYMBALTA) 30 MG capsule, Take 30 mg by mouth daily.   lubiprostone (AMITIZA) 24 MCG capsule, TAKE 1 CAPSULE BY MOUTH TWICE DAILY WITHA MEAL   Multiple Vitamin (MULTIVITAMIN) tablet, Take 1 tablet by mouth daily.   pantoprazole (PROTONIX) 40 MG tablet, TAKE 1 TABLET BY MOUTH ONCE A DAY   polyethylene glycol powder (GLYCOLAX/MIRALAX) 17 GM/SCOOP powder, Take 1 Scoop by mouth 2 (two) times daily. Clear lax   Sennosides-Docusate Sodium (EQ STOOL SOFTENER/LAXATIVE PO), Take 1 tablet by mouth daily.   tamsulosin (FLOMAX) 0.4 MG CAPS capsule, Take 0.4 mg by mouth daily.   Vit C-Quercet-Bioflv-Bromelain 450-250-125-50 MG CAPS, Take 2 Doses by mouth 3 (three) times daily between meals.   Reviewed prior external information including notes and imaging from  primary care provider As well as notes that were available from care everywhere and  other healthcare systems.  Reviewed imaging including the MRI of the abdomen with and without contrast that did not show any significant findings in the gastroenterology area.  Patient has had his gallbladder removed.  Reviewed patient's laboratory workup as well but does not show anything that points in any direction that could contribute to his discomfort.  Past medical history, social, surgical and family history all reviewed in electronic medical record.  No pertanent information unless stated regarding to the chief complaint.   Review of Systems:  No  headache, visual changes, nausea, vomiting, diarrhea, constipation, dizziness,  skin rash, fevers, chills, night sweats, weight loss, swollen lymph nodes, body aches, joint swelling, chest pain, shortness of breath, mood changes. POSITIVE muscle aches, abdominal pain  Objective  Blood pressure 116/82, pulse 84, height 5\' 10"  (1.778 m), weight 238 lb (108 kg), SpO2 97 %.   General: No apparent distress alert and oriented x3 mood and affect normal, dressed appropriately.  HEENT: Pupils equal, extraocular movements intact  Respiratory: Patient's speak in full sentences and does not appear short of breath  Cardiovascular: No lower extremity edema, non tender, no erythema  Back exam does have some mild loss of lordosis of the lumbar spine.  Tightness noted more in the thoracolumbar juncture though noted. Seems to be more midline, just right to the midline.  Negative straight leg test noted.  No significant abdominal pain noted on exam today.  No involuntary guarding noted.   Impression and Recommendations:    The above documentation has been reviewed and is accurate and complete Judi Saa, DO

## 2023-03-12 ENCOUNTER — Ambulatory Visit (INDEPENDENT_AMBULATORY_CARE_PROVIDER_SITE_OTHER): Payer: 59 | Admitting: Family Medicine

## 2023-03-12 VITALS — BP 116/82 | HR 84 | Ht 70.0 in | Wt 238.0 lb

## 2023-03-12 DIAGNOSIS — M5137 Other intervertebral disc degeneration, lumbosacral region: Secondary | ICD-10-CM

## 2023-03-12 DIAGNOSIS — M546 Pain in thoracic spine: Secondary | ICD-10-CM

## 2023-03-12 DIAGNOSIS — M5134 Other intervertebral disc degeneration, thoracic region: Secondary | ICD-10-CM | POA: Insufficient documentation

## 2023-03-12 DIAGNOSIS — J301 Allergic rhinitis due to pollen: Secondary | ICD-10-CM | POA: Diagnosis not present

## 2023-03-12 NOTE — Assessment & Plan Note (Signed)
When reviewing all imaging as well as labs and other notes for greater than 47 minutes it does appear that patient may have had some degenerative disc disease and facet arthropathy noted at the T11-T12 area that could be more secondary to a dermatome in this area.  Discussed with patient that he does not feel he has had an epidural in this area and I do not see that that has been done in his notes.  We discussed the risk benefits of this procedure and did send him for this to see if we can get some benefit.  We also attempted osteopathic manipulation for the upper back and neck.  Significant improvement with time but did not want to do the lower back at this moment with a history of the fusion and still the ambiguous nature of the diagnosis.  Hopeful that this will make some difference.  Patient was adamant he would like to not add any other medications at this time.  Hopeful that we will be able to get patient to increase his activity in the near future after the epidural.  Patient is scheduled to have a nerve conduction test in 1 weeks time and will have that first to see if that changes any of our management.  If we go through with the epidural would like patient to follow-up with me again 6 weeks afterwards.

## 2023-03-12 NOTE — Patient Instructions (Addendum)
Epidural [id number] Schedule after you see Dr. Christell Constant Avoid back extension See me 5-6 weeks after injection

## 2023-03-13 NOTE — Assessment & Plan Note (Signed)
Known degenerative disc disease.  Not responding well to the epidurals.  Would not correlate with where patient does have some pain in the right upper quadrant area.  Once again the thoracic degenerative disc disease though we may.  We discussed with patient about icing regimen and home exercises.  Discussed avoiding certain activities.  Will have patient follow-up with me again in 6 to 8 weeks.

## 2023-03-18 ENCOUNTER — Other Ambulatory Visit: Payer: Self-pay | Admitting: Internal Medicine

## 2023-03-19 ENCOUNTER — Telehealth: Payer: Self-pay | Admitting: Family Medicine

## 2023-03-19 ENCOUNTER — Telehealth: Payer: Self-pay | Admitting: Orthopedic Surgery

## 2023-03-19 DIAGNOSIS — J301 Allergic rhinitis due to pollen: Secondary | ICD-10-CM | POA: Diagnosis not present

## 2023-03-19 NOTE — Telephone Encounter (Signed)
FYI from pt. Per [facility] , pt has to wait 2 weeks from EMG to have the epidural. Wanted Dr. Katrinka Blazing to know as he was under the impression he could do these in closer order.

## 2023-03-25 ENCOUNTER — Ambulatory Visit (INDEPENDENT_AMBULATORY_CARE_PROVIDER_SITE_OTHER): Payer: 59 | Admitting: Neurology

## 2023-03-25 DIAGNOSIS — M5414 Radiculopathy, thoracic region: Secondary | ICD-10-CM

## 2023-03-25 DIAGNOSIS — R202 Paresthesia of skin: Secondary | ICD-10-CM

## 2023-03-25 NOTE — Procedures (Signed)
  [facility]  [address] [place], [address]  [place], [place] [address] Tel: [phone] Fax: [phone] Test Date:  03/25/2023  Patient: Walter Edwards DOB: 06-14-1971 Physician: Jacquelyne Balint, MD  Sex: Male Height: 5\' 10"  Ref Phys: Willia Craze, MD  ID#: [id number]   Technician:    History: This is a 52 year old male with thoracic back pain and abdominal wall pain.  NCV & EMG Findings: Needle examination of thoracic paraspinal muscles (T5 and T9 levels) bilaterally shows chronic motor axon loss changes without accompanying active denervation changes in bilateral thoracic (T5) paraspinal muscles. Thoracic paraspinal muscles at the T9 level bilaterally are normal.  Impression: This is an abnormal study. 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    ___________________________ Jacquelyne Balint, MD    Electromyography   Side Muscle Ins.Act Fibs Fasc Recrt Amp Dur Poly Activation Comment  Left T5 PSP Nml Nml Nml *1- *1+ *1+ *1+ Nml N/A  Left T9 PSP Nml Nml Nml Nml Nml Nml Nml Nml N/A  Right T5 PSP Nml Nml Nml *1- *1+ *1+ *1+ Nml N/A  Right T9 PSP Nml Nml Nml Nml Nml Nml Nml Nml N/A

## 2023-03-26 DIAGNOSIS — J301 Allergic rhinitis due to pollen: Secondary | ICD-10-CM | POA: Diagnosis not present

## 2023-03-27 ENCOUNTER — Ambulatory Visit (INDEPENDENT_AMBULATORY_CARE_PROVIDER_SITE_OTHER): Payer: 59 | Admitting: Orthopedic Surgery

## 2023-03-27 ENCOUNTER — Telehealth: Payer: Self-pay | Admitting: Family Medicine

## 2023-03-27 DIAGNOSIS — M5414 Radiculopathy, thoracic region: Secondary | ICD-10-CM

## 2023-03-27 NOTE — Telephone Encounter (Signed)
Sent patient MyChart message.

## 2023-03-27 NOTE — Progress Notes (Signed)
Orthopedic Spine Surgery Office Note   Assessment: Patient is a 52 y.o. male with lower thoracic back pain that radiates into his chest wall to the midline.  It does this on the right side.  He points to roughly at T6 or T7 distribution     Plan: -Patient has undergone multiple conservative treatments as noted below.  He had a positive EMG/NCS that showed changes at the bilateral T5 level.  Accordingly, recommended an MRI with and without contrast of the thoracic spine to look specifically for an extra canal lesion or a nerve sheath tumor that could be causing this thoracic radicular pain -He already has a thoracic epidural injection schedule and I am interested to see how much benefit he gets from that -Patient has tried PT, NSAIDs, Tylenol, steroid injections, narcotics -Patient should return to office in 5 weeks, x-rays at next visit: none     Patient expressed understanding of the plan and all questions were answered to the patient's satisfaction.    ___________________________________________________________________________     History:   Patient is a 52 y.o. male who presents today for routine follow-up on his thoracic spine.  He states that his pain is unchanged the last time I saw him.  He still having pain that starts in his thoracic back and radiates along the chest wall to the anterior abdomen.  He points today to an area above the umbilicus.  He is roughly pointing to the area of T6 or T7.  It is between the umbilicus and the nipple line.  He still notes that pain is worse with bending activities or lifting more than a couple pounds.  His pain does get better if he rests or sits down.  He is scheduled to have a thoracic epidural injection in the next few weeks.   Treatments tried: PT, NSAIDs, Tylenol, steroid injections, narcotics     Physical Exam:   General: no acute distress, appears stated age Neurologic: alert, answering questions appropriately, following  commands Respiratory: unlabored breathing on room air, symmetric chest rise Psychiatric: appropriate affect, normal cadence to speech     MSK (spine):   -Strength exam                                                   Left                  Right EHL                              5/5                  5/5 TA                                 5/5                  5/5 GSC                             5/5                  5/5 Knee extension            5/5  5/5 Hip flexion                    5/5                  5/5   -Sensory exam                           Sensation intact to light touch in L3-S1 nerve distributions of bilateral lower extremities    Imaging: XR of the thoracic spine from 02/20/2023 was previously independently reviewed and interpreted, showing no fracture or dislocation.  No scoliosis seen.  Physiologic kyphosis.  No significant disc height loss seen.  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

## 2023-03-27 NOTE — Telephone Encounter (Signed)
Patient called stating that he was seen by [facility] today and had a nerve study done. The Orthopedic was in agreement with the epidural but felt that it would be more beneficial at T7-T8.  I think the epidural was ordered for T11-T12.  Please advise.

## 2023-04-02 DIAGNOSIS — J301 Allergic rhinitis due to pollen: Secondary | ICD-10-CM | POA: Diagnosis not present

## 2023-04-03 ENCOUNTER — Encounter: Payer: Self-pay | Admitting: Orthopedic Surgery

## 2023-04-03 MED ORDER — DIAZEPAM 5 MG PO TABS: 5.0000 mg | ORAL_TABLET | Freq: Once | ORAL | 0 refills | Status: DC | PRN

## 2023-04-07 DIAGNOSIS — J301 Allergic rhinitis due to pollen: Secondary | ICD-10-CM | POA: Diagnosis not present

## 2023-04-08 NOTE — Discharge Instructions (Signed)

## 2023-04-09 ENCOUNTER — Ambulatory Visit
Admission: RE | Admit: 2023-04-09 | Discharge: 2023-04-09 | Disposition: A | Discharge status: Home / Self Care | Payer: 59 | Source: Ambulatory Visit | Attending: Family Medicine | Admitting: Family Medicine

## 2023-04-09 DIAGNOSIS — M546 Pain in thoracic spine: Secondary | ICD-10-CM

## 2023-04-09 DIAGNOSIS — M4724 Other spondylosis with radiculopathy, thoracic region: Secondary | ICD-10-CM | POA: Diagnosis not present

## 2023-04-09 MED ORDER — IOPAMIDOL (ISOVUE-M 300) INJECTION 61%
1.0000 mL | Freq: Once | INTRAMUSCULAR | Status: AC
  Administered 2023-04-09: 1 mL via EPIDURAL

## 2023-04-09 MED ORDER — TRIAMCINOLONE ACETONIDE 40 MG/ML IJ SUSP (RADIOLOGY)
60.0000 mg | Freq: Once | INTRAMUSCULAR | Status: AC
  Administered 2023-04-09: 60 mg via EPIDURAL

## 2023-04-15 DIAGNOSIS — J301 Allergic rhinitis due to pollen: Secondary | ICD-10-CM | POA: Diagnosis not present

## 2023-04-15 DIAGNOSIS — M4724 Other spondylosis with radiculopathy, thoracic region: Secondary | ICD-10-CM | POA: Diagnosis not present

## 2023-04-16 DIAGNOSIS — J301 Allergic rhinitis due to pollen: Secondary | ICD-10-CM | POA: Diagnosis not present

## 2023-04-23 DIAGNOSIS — J301 Allergic rhinitis due to pollen: Secondary | ICD-10-CM | POA: Diagnosis not present

## 2023-04-28 ENCOUNTER — Ambulatory Visit (INDEPENDENT_AMBULATORY_CARE_PROVIDER_SITE_OTHER): Payer: 59 | Admitting: Orthopedic Surgery

## 2023-04-28 DIAGNOSIS — M5414 Radiculopathy, thoracic region: Secondary | ICD-10-CM | POA: Diagnosis not present

## 2023-04-28 MED ORDER — PREGABALIN 75 MG PO CAPS: 75.0000 mg | ORAL_CAPSULE | Freq: Two times a day (BID) | ORAL | 0 refills | Status: DC

## 2023-04-28 NOTE — Progress Notes (Signed)
Orthopedic Spine Surgery Office Note   Assessment: Patient is a 52 y.o. male with lower thoracic back pain that radiates into his chest wall to the midline.  It does this on the right side.  He points to roughly at T6 or T7 distribution. There is no significant stenosis seen on his thoracic MRI to explain the pain     Plan: -No acute operative intervention planned -I told him I am not sure of the etiology of his pain.  He is now seen to spine specialist and had 2 thoracic spine MRIs that do not show any stenosis to explain his pain.  I told him it may be due to intercostal neuralgia but that is not a condition I normally treat so not certain that that is the cause -Recommended he go to an academic center for further workup of potentially rare causes of his radiating pain -Prescribed Lyrica for additional pain relief -Patient has tried PT, NSAIDs, Tylenol, steroid injections, narcotics, pain management -Patient should return to office on an as-needed basis     Patient expressed understanding of the plan and all questions were answered to the patient's satisfaction.    ___________________________________________________________________________     History:   Patient is a 52 y.o. male who presents today for routine follow-up on his thoracic spine.  His pain is unchanged the last time I saw him.  Still having pain that starts in his thoracic back and radiates along the chest wall to the anterior abdomen.  He points today to an area above the umbilicus.  He is roughly pointing to the area of T6 or T7.  It is between the umbilicus and the nipple line. Pain is worse with activity particularly bending or lifting. His pain does get better if he rests, especially laying down. Says he will get complete relief if he lays down. He also notes that he does not have pain when in a swimming pool.  He got a thoracic epidural injection after our last visit, which did not help his radiating pain.   Treatments  tried: PT, NSAIDs, Tylenol, steroid injections, narcotics, pain management     Physical Exam:   General: no acute distress, appears stated age Neurologic: alert, answering questions appropriately, following commands Respiratory: unlabored breathing on room air, symmetric chest rise Psychiatric: appropriate affect, normal cadence to speech     MSK (spine):   -Strength exam                                                   Left                  Right EHL                              5/5                  5/5 TA                                 5/5                  5/5 GSC  5/5                  5/5 Knee extension            5/5                  5/5 Hip flexion                    5/5                  5/5   -Sensory exam                           Sensation intact to light touch in L3-S1 nerve distributions of bilateral lower extremities     Imaging: XR of the thoracic spine from 02/20/2023 was previously independently reviewed and interpreted, showing no fracture or dislocation.  No scoliosis seen.  Physiologic kyphosis.  No significant disc height loss seen.   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  No thoracic foraminal stenosis seen. No fracture or dislocation. No paraspinal masses seen.     Patient name: TREVIOUS FOUCHE Patient MRN: [id number] Date of visit: 04/28/23

## 2023-04-29 ENCOUNTER — Encounter: Payer: Self-pay | Admitting: Orthopedic Surgery

## 2023-04-30 DIAGNOSIS — J301 Allergic rhinitis due to pollen: Secondary | ICD-10-CM | POA: Diagnosis not present

## 2023-05-07 DIAGNOSIS — J301 Allergic rhinitis due to pollen: Secondary | ICD-10-CM | POA: Diagnosis not present

## 2023-05-14 DIAGNOSIS — J301 Allergic rhinitis due to pollen: Secondary | ICD-10-CM | POA: Diagnosis not present

## 2023-05-21 DIAGNOSIS — J301 Allergic rhinitis due to pollen: Secondary | ICD-10-CM | POA: Diagnosis not present

## 2023-05-21 NOTE — Progress Notes (Unsigned)
[facility] [address] [place] [address] Phone: [phone] Subjective:   Bruce Donath, am serving as a scribe for Dr. Antoine Primas.  I'm seeing this patient by the request  of:  Marguarite Arbour, MD  CC: Abdominal pain, back pain follow-up  [id number]  03/12/2023 Known degenerative disc disease. Not responding well to the epidurals. Would not correlate with where patient does have some pain in the right upper quadrant area. Once again the thoracic degenerative disc disease though we may. We discussed with patient about icing regimen and home exercises. Discussed avoiding certain activities. Will have patient follow-up with me again in 6 to 8 weeks.   Update 81/2024 MASOOD STCLAIR is a 52 y.o. male coming in with complaint of DDD lumbar. Epidural 04/09/2023. Patient states that he is still having pain in R anterior ribs. Pain is sharp at times. Epidural did not help back pain.        Past Medical History:  Diagnosis Date   Allergy    Chronic back pain    Gastritis    GERD (gastroesophageal reflux disease)    History of kidney stones    Hyperlipidemia    Hypertension    Inguinal hernia    Internal hemorrhoids    Low HDL (under 40)    Mild depression    Otitis media    Seasonal rhinitis    Past Surgical History:  Procedure Laterality Date   BACK SURGERY     x3   CHOLECYSTECTOMY  06/02/2020   CHOLECYSTECTOMY N/A 06/02/2020   Procedure: LAPAROSCOPIC CHOLECYSTECTOMY;  Surgeon: [name], MD;  Location: [facility];  Service: General;  Laterality: N/A;   COLONOSCOPY     ENDOSCOPIC PLANTAR FASCIOTOMY  05/23/2020   VENTRAL HERNIA REPAIR N/A 07/17/2021   Procedure: VENTRA HERNIA REPAIR;  Surgeon: [name], MD;  Location: [facility];  Service: General;  Laterality: N/A;   Social History   Socioeconomic History   Marital status: Single    Spouse name: Not on file   Number of children: 1   Years of  education: [employer] education level: Not on file  Occupational History   Not on file  Tobacco Use   Smoking status: Never   Smokeless tobacco: Former  Building services engineer status: Never Used  Substance and Sexual Activity   Alcohol use: Yes    Comment: on the weekends maybe   Drug use: Never   Sexual activity: Yes    Birth control/protection: Condom  Other Topics Concern   Not on file  Social History Narrative   11/11/19   From: the area   Living: alone   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 [place]      Family: 1 daughter - Ladona Ridgel - 2003      Enjoys: relax, outdoor activity, hiking, concerts      Exercise: hiking, 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, 9-10K steps per day   Diet: had done low carb, but not doing great now, meat/veggies/fruit      Safety   Seat belts: Yes    Guns: Yes  and secure   Safe in relationships: Yes    Social Determinants of Health   Financial Resource Strain: Not on file  Food Insecurity: Not on file  Transportation Needs: Not on file  Physical Activity: Not on file  Stress: Not on file  Social Connections: Unknown (04/30/2022)  Received from [employer]   Social Network    Social Network: Not on file   Allergies  Allergen Reactions   Hydrocodone Itching   Tessalon Perles [Benzonatate] Rash    itching   Family History  Problem Relation Age of Onset   Diabetes Mother    Anxiety disorder Mother    Hypertension Mother    Diabetes Father    Hypertension Father    Obesity Sister    Hypertension Sister    Colon cancer Neg Hx    Pancreatic cancer Neg Hx    Esophageal cancer Neg Hx    Liver cancer Neg Hx      Current Outpatient Medications (Cardiovascular):    amLODipine (NORVASC) 10 MG tablet, Take 1 tablet (10 mg total) by mouth daily.   EPINEPHrine 0.3 mg/0.3 mL IJ SOAJ injection, Inject 0.3 mg into the muscle as needed.  Current Outpatient Medications (Respiratory):    diphenhydrAMINE  (BENADRYL) 25 MG tablet, Take 25 mg by mouth See admin instructions. Takes with hydrocodone to prevent itching as needed   fexofenadine (ALLEGRA) 180 MG tablet, Take 1 tablet (180 mg total) by mouth daily.   montelukast (SINGULAIR) 10 MG tablet, TAKE 1 TABLET BY MOUTH EVERY MORNING FORALLERGIES   triamcinolone (NASACORT) 55 MCG/ACT AERO nasal inhaler, Place 2 sprays into the nose daily.  Current Outpatient Medications (Analgesics):    HYDROcodone-acetaminophen (NORCO) 10-325 MG tablet, Take by mouth.   ibuprofen (ADVIL) 800 MG tablet, Take 1 tablet by mouth 3 (three) times daily as needed.   Current Outpatient Medications (Other):    amitriptyline (ELAVIL) 25 MG tablet, Take 25 mg by mouth at bedtime.   cyclobenzaprine (FLEXERIL) 10 MG tablet, Take 10 mg by mouth daily as needed (Back pain).    diazepam (VALIUM) 5 MG tablet, Take 1 tablet (5 mg total) by mouth once as needed for up to 1 dose (before his MRI).   dicyclomine (BENTYL) 10 MG capsule, Take 10 mg by mouth every 6 (six) hours as needed.   docusate sodium (COLACE) 250 MG capsule, Take 500 mg by mouth daily after lunch.   DULoxetine (CYMBALTA) 30 MG capsule, Take 30 mg by mouth daily.   lubiprostone (AMITIZA) 24 MCG capsule, TAKE 1 CAPSULE BY MOUTH TWICE DAILY WITHA MEAL   Multiple Vitamin (MULTIVITAMIN) tablet, Take 1 tablet by mouth daily.   pantoprazole (PROTONIX) 40 MG tablet, TAKE 1 TABLET BY MOUTH ONCE A DAY   polyethylene glycol powder (GLYCOLAX/MIRALAX) 17 GM/SCOOP powder, Take 1 Scoop by mouth 2 (two) times daily. Clear lax   pregabalin (LYRICA) 75 MG capsule, Take 1 capsule (75 mg total) by mouth 2 (two) times daily.   Sennosides-Docusate Sodium (EQ STOOL SOFTENER/LAXATIVE PO), Take 1 tablet by mouth daily.   tamsulosin (FLOMAX) 0.4 MG CAPS capsule, Take 0.4 mg by mouth daily.   Vit C-Quercet-Bioflv-Bromelain 450-250-125-50 MG CAPS, Take 2 Doses by mouth 3 (three) times daily between meals.   Reviewed prior external  information including notes and imaging from  primary care provider As well as notes that were available from care everywhere and other healthcare systems.  Past medical history, social, surgical and family history all reviewed in electronic medical record.  No pertanent information unless stated regarding to the chief complaint.   Review of Systems:  No headache, visual changes, nausea, vomiting, diarrhea, constipation, dizziness,  skin rash, fevers, chills, night sweats, weight loss, swollen lymph nodes, body aches, joint swelling, chest pain, shortness of breath, mood changes. POSITIVE muscle aches,  abdominal pain  Objective  Blood pressure 108/70, pulse 86, height 5\' 10"  (1.778 m), weight 238 lb (108 kg), SpO2 96%.   General: No apparent distress alert and oriented x3 mood and affect normal, dressed appropriately.  HEENT: Pupils equal, extraocular movements intact  Respiratory: Patient's speak in full sentences and does not appear short of breath  Cardiovascular: No lower extremity edema, non tender, no erythema  Patient does have poor core strength.  Some mild tenderness noted just right of midline in the right upper quadrant abdominal area.  Just on the inferior costal area.  No masses appreciated.  Procedure: Real-time Ultrasound Guided Injection of trigger point abdominal musculature Device: GE Logiq Q7 Ultrasound guided injection is preferred based studies that show increased duration, increased effect, greater accuracy, decreased procedural pain, increased response rate, and decreased cost with ultrasound guided versus blind injection.  Verbal informed consent obtained.  Time-out conducted.  Noted no overlying erythema, induration, or other signs of local infection.  Skin prepped in a sterile fashion.  Local anesthesia: Topical Ethyl chloride.  With sterile technique and under real time ultrasound guidance: With a 21-gauge 2 inch needle injected into the rectus sheath just right  of midline in the area where there is some hypoechoic changes with a total of 2 cc of 0.5% Marcaine and 1 cc of Kenalog 40 mg/mL. Completed without difficulty  Patient able to take a deep breath without any significant difficulty. Advised to call if fevers/chills, erythema, induration, drainage, or persistent bleeding.  Impression: Technically successful ultrasound guided injection.    Impression and Recommendations:      The above documentation has been reviewed and is accurate and complete Judi Saa, DO

## 2023-05-22 ENCOUNTER — Ambulatory Visit (INDEPENDENT_AMBULATORY_CARE_PROVIDER_SITE_OTHER): Payer: 59 | Admitting: Family Medicine

## 2023-05-22 ENCOUNTER — Encounter: Payer: Self-pay | Admitting: Family Medicine

## 2023-05-22 ENCOUNTER — Other Ambulatory Visit: Payer: Self-pay

## 2023-05-22 VITALS — BP 108/70 | HR 86 | Ht 70.0 in | Wt 238.0 lb

## 2023-05-22 DIAGNOSIS — R109 Unspecified abdominal pain: Secondary | ICD-10-CM | POA: Diagnosis not present

## 2023-05-22 DIAGNOSIS — R0781 Pleurodynia: Secondary | ICD-10-CM | POA: Diagnosis not present

## 2023-05-22 NOTE — Assessment & Plan Note (Signed)
Patient given trigger point injections in the abdomen region today.  We will see how patient responds.  Discussed with patient that this is still very difficult.  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  Patient has been referred to pain management.  Patient has seen nearly 20 doctors in 3 years which is starting to frustrate patient.  He has had surgery for it as well for the gallbladder with no significant benefit.  Epidural did not seem to make improvement and we could consider another level in the thoracic spine but at the moment we will continue this and see how patient responds.  Another provider put him on Lyrica and this has titrated him up to 150 mg twice a day.  Patient will follow-up with me again in 2 months otherwise.  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

## 2023-05-22 NOTE — Patient Instructions (Signed)
See me in 2-3 months Give update in 2 weeks

## 2023-05-28 DIAGNOSIS — J301 Allergic rhinitis due to pollen: Secondary | ICD-10-CM | POA: Diagnosis not present

## 2023-06-03 ENCOUNTER — Encounter: Payer: Self-pay | Admitting: Family Medicine

## 2023-06-04 DIAGNOSIS — J301 Allergic rhinitis due to pollen: Secondary | ICD-10-CM | POA: Diagnosis not present

## 2023-06-06 DIAGNOSIS — Z0189 Encounter for other specified special examinations: Secondary | ICD-10-CM | POA: Diagnosis not present

## 2023-06-06 DIAGNOSIS — E7801 Familial hypercholesterolemia: Secondary | ICD-10-CM | POA: Diagnosis not present

## 2023-06-06 DIAGNOSIS — Z79899 Other long term (current) drug therapy: Secondary | ICD-10-CM | POA: Diagnosis not present

## 2023-06-06 DIAGNOSIS — Z125 Encounter for screening for malignant neoplasm of prostate: Secondary | ICD-10-CM | POA: Diagnosis not present

## 2023-06-06 DIAGNOSIS — Z114 Encounter for screening for human immunodeficiency virus [HIV]: Secondary | ICD-10-CM | POA: Diagnosis not present

## 2023-06-10 DIAGNOSIS — E6609 Other obesity due to excess calories: Secondary | ICD-10-CM | POA: Insufficient documentation

## 2023-06-10 DIAGNOSIS — M545 Low back pain, unspecified: Secondary | ICD-10-CM | POA: Diagnosis not present

## 2023-06-10 DIAGNOSIS — Z6833 Body mass index (BMI) 33.0-33.9, adult: Secondary | ICD-10-CM | POA: Diagnosis not present

## 2023-06-10 DIAGNOSIS — G8929 Other chronic pain: Secondary | ICD-10-CM | POA: Diagnosis not present

## 2023-06-10 DIAGNOSIS — I10 Essential (primary) hypertension: Secondary | ICD-10-CM | POA: Diagnosis not present

## 2023-06-10 DIAGNOSIS — Z0000 Encounter for general adult medical examination without abnormal findings: Secondary | ICD-10-CM | POA: Diagnosis not present

## 2023-06-11 DIAGNOSIS — J301 Allergic rhinitis due to pollen: Secondary | ICD-10-CM | POA: Diagnosis not present

## 2023-06-12 ENCOUNTER — Other Ambulatory Visit: Payer: Self-pay

## 2023-06-12 DIAGNOSIS — M47814 Spondylosis without myelopathy or radiculopathy, thoracic region: Secondary | ICD-10-CM

## 2023-06-16 ENCOUNTER — Other Ambulatory Visit: Payer: Self-pay | Admitting: Internal Medicine

## 2023-06-18 DIAGNOSIS — J301 Allergic rhinitis due to pollen: Secondary | ICD-10-CM | POA: Diagnosis not present

## 2023-06-25 ENCOUNTER — Ambulatory Visit: Payer: Self-pay

## 2023-06-25 ENCOUNTER — Ambulatory Visit: Payer: Self-pay | Admitting: Physician Assistant

## 2023-06-25 ENCOUNTER — Encounter: Payer: Self-pay | Admitting: Physician Assistant

## 2023-06-25 VITALS — BP 136/97 | Temp 98.0°F | Resp 14 | Ht 70.0 in | Wt 230.0 lb

## 2023-06-25 VITALS — BP 138/87 | HR 89 | Resp 14

## 2023-06-25 DIAGNOSIS — Z0289 Encounter for other administrative examinations: Secondary | ICD-10-CM

## 2023-06-25 DIAGNOSIS — J301 Allergic rhinitis due to pollen: Secondary | ICD-10-CM | POA: Diagnosis not present

## 2023-06-25 DIAGNOSIS — Z0000 Encounter for general adult medical examination without abnormal findings: Secondary | ICD-10-CM

## 2023-06-25 NOTE — Progress Notes (Signed)
Pt presents today to complete FF physical.

## 2023-06-25 NOTE — Progress Notes (Signed)
[facility]  ____________________________________________   None    (approximate)  I have reviewed the triage vital signs and the nursing notes.   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  Patient was no concerns or complaints.         Past Medical History:  Diagnosis Date   Allergy    Chronic back pain    Gastritis    GERD (gastroesophageal reflux disease)    History of kidney stones    Hyperlipidemia    Hypertension    Inguinal hernia    Internal hemorrhoids    Low HDL (under 40)    Mild depression    Otitis media    Seasonal rhinitis     Patient Active Problem List   Diagnosis Date Noted   Trigger point of abdomen 05/22/2023   Thoracic degenerative disc disease 03/12/2023   Cyst on ear 01/29/2021   Gastroesophageal reflux disease 01/09/2021   Fatty liver 01/09/2021   DDD (degenerative disc disease), lumbosacral 10/03/2020   Athlete's foot on left 03/29/2020   Seasonal allergic rhinitis 03/29/2020   History of fusion of lumbar spine 11/11/2019   Essential hypertension 11/11/2019   Elevated PSA, less than 10 ng/ml 11/11/2019   RUQ abdominal pain 11/11/2019   Plantar fasciitis 11/11/2019   Body mass index (BMI) 30.0-30.9, adult 09/28/2019   Acute back pain with sciatica 03/27/2018    Past Surgical History:  Procedure Laterality Date   BACK SURGERY     x3   CHOLECYSTECTOMY  06/02/2020   CHOLECYSTECTOMY N/A 06/02/2020   Procedure: LAPAROSCOPIC CHOLECYSTECTOMY;  Surgeon: [name], MD;  Location: [facility];  Service: General;  Laterality: N/A;   COLONOSCOPY     ENDOSCOPIC PLANTAR FASCIOTOMY  05/23/2020   VENTRAL HERNIA REPAIR N/A 07/17/2021   Procedure: Caren Macadam HERNIA REPAIR;  Surgeon: [name], MD;  Location: [facility];  Service: General;  Laterality: N/A;    Prior to Admission medications   Medication  Sig Start Date End Date Taking? Authorizing Provider  amLODipine (NORVASC) 10 MG tablet Take 1 tablet (10 mg total) by mouth daily. 05/25/21  Yes Gweneth Dimitri, MD  cyclobenzaprine (FLEXERIL) 10 MG tablet Take 10 mg by mouth daily as needed (Back pain).  04/04/20  Yes [provider]  diphenhydrAMINE (BENADRYL) 25 MG tablet Take 25 mg by mouth See admin instructions. Takes with hydrocodone to prevent itching as needed   Yes [provider]  docusate sodium (COLACE) 250 MG capsule Take 500 mg by mouth daily after lunch.   Yes [provider]  DULoxetine (CYMBALTA) 30 MG capsule Take 30 mg by mouth daily.   Yes [provider]  EPINEPHrine 0.3 mg/0.3 mL IJ SOAJ injection Inject 0.3 mg into the muscle as needed. 01/04/21  Yes [provider]  fexofenadine (ALLEGRA) 180 MG tablet Take 1 tablet (180 mg total) by mouth daily. 03/29/20  Yes Gweneth Dimitri, MD  HYDROcodone-acetaminophen [facility]) 10-325 MG tablet Take by mouth. 06/04/21  Yes [provider]  ibuprofen (ADVIL) 800 MG tablet Take 1 tablet by mouth 3 (three) times daily as needed. 07/05/22  Yes [provider]  lubiprostone (AMITIZA) 24 MCG capsule TAKE ONE CAPSULE BY MOUTH TWICE DAILY WITH A MEAL 06/16/23  Yes Pyrtle, Carie Caddy, MD  montelukast (SINGULAIR) 10 MG tablet TAKE 1 TABLET BY MOUTH EVERY MORNING FORALLERGIES 07/06/21  Yes  Gweneth Dimitri, MD  Multiple Vitamin (MULTIVITAMIN) tablet Take 1 tablet by mouth daily.   Yes [provider]  pantoprazole (PROTONIX) 40 MG tablet TAKE 1 TABLET BY MOUTH ONCE A DAY 07/18/22  Yes Sparks, Duane Lope, MD  polyethylene glycol powder (GLYCOLAX/MIRALAX) 17 GM/SCOOP powder Take 1 Scoop by mouth 2 (two) times daily. Clear lax   Yes [provider]  Sennosides-Docusate Sodium (EQ STOOL SOFTENER/LAXATIVE PO) Take 1 tablet by mouth daily.   Yes [provider]  tamsulosin (FLOMAX) 0.4 MG CAPS capsule Take 0.4 mg by mouth daily. 06/25/22  Yes  [provider]  triamcinolone (NASACORT) 55 MCG/ACT AERO nasal inhaler Place 2 sprays into the nose daily. 01/04/21  Yes Padgett, Pilar Grammes, MD  Vit C-Quercet-Bioflv-Bromelain [phone] MG CAPS Take 2 Doses by mouth 3 (three) times daily between meals. 11/21/20  Yes Pricilla Handler, MD  pregabalin (LYRICA) 75 MG capsule Take 1 capsule (75 mg total) by mouth 2 (two) times daily. 04/28/23 05/28/23  London Sheer, MD    Allergies Hydrocodone and Tessalon perles [benzonatate]  Family History  Problem Relation Age of Onset   Diabetes Mother    Anxiety disorder Mother    Hypertension Mother    Diabetes Father    Hypertension Father    Obesity Sister    Hypertension Sister    Colon cancer Neg Hx    Pancreatic cancer Neg Hx    Esophageal cancer Neg Hx    Liver cancer Neg Hx     Social History Social History   Tobacco Use   Smoking status: Never   Smokeless tobacco: Former  Building services engineer status: Never Used  Substance Use Topics   Alcohol use: Yes    Comment: on the weekends maybe   Drug use: Never    Review of Systems  Constitutional: No fever/chills Eyes: No visual changes. ENT: No sore throat. Cardiovascular: Denies chest pain. Respiratory: Denies shortness of breath. Gastrointestinal: No abdominal pain.  No nausea, no vomiting.  No diarrhea.  No constipation. Genitourinary: Negative for dysuria. Musculoskeletal: Negative for back pain. Skin: Negative for rash. Neurological: Negative for headaches, focal weakness or numbness. Psychiatric: Depression Endocrine: Hyperlipidemia and hypertension Hematological/Lymphatic:  Allergic/Immunilogical: Hydrocodone and Tessalon  ____________________________________________   PHYSICAL EXAM:  VITAL SIGNS: BP 136/97  Resp 14  Temp 98 F (36.7 C)  Temp src Temporal  SpO2 98 %  Weight 230 lb (104.3 kg)  Height 5\' 10"  (1.778 m)   BMI 33.00 kg/m2  BSA 2.27 m2   Constitutional: Alert and oriented.  Well appearing and in no acute distress. Eyes: Conjunctivae are normal. PERRL. EOMI. Head: Atraumatic. Nose: No congestion/rhinnorhea. Mouth/Throat: Mucous membranes are moist.  Oropharynx non-erythematous. Neck: No stridor.  No cervical spine tenderness to palpation. Hematological/Lymphatic/Immunilogical: No cervical lymphadenopathy. Cardiovascular: Normal rate, regular rhythm. Grossly normal heart sounds.  Good peripheral circulation. Respiratory: Normal respiratory effort.  No retractions. Lungs CTAB. Gastrointestinal: Soft and nontender. No distention. No abdominal bruits. No CVA tenderness. Genitourinary: Deferred Musculoskeletal: No lower extremity tenderness nor edema.  No joint effusions. Neurologic:  Normal speech and language. No gross focal neurologic deficits are appreciated. No gait instability. Skin:  Skin is warm, dry and intact. No rash noted. Psychiatric: Mood and affect are normal. Speech and behavior are normal.  ____________________________________________   Vickie Epley    [facility] Outside Information    suggestion  Information displayed in this report may not trend or trigger automated decision support.  Contains abnormal data Urinalysis w/Microscopic Order: [id number] Component Ref Range & Units 2 wk ago  Color Colorless, Straw, Light Yellow, Yellow, Dark Yellow Light Yellow  Clarity Clear Clear  Specific Gravity 1.005 - 1.030 1.016  pH, Urine 5.0 - 8.0 5.5  Protein, Urinalysis Negative mg/dL Negative  Glucose, Urinalysis Negative mg/dL Negative  Ketones, Urinalysis Negative mg/dL Negative  Blood, Urinalysis Negative Trace Abnormal   Nitrite, Urinalysis Negative Negative  Leukocyte Esterase, Urinalysis Negative Negative  Bilirubin, Urinalysis Negative Negative  Urobilinogen, Urinalysis 0.2 - 1.0 mg/dL 0.2  WBC, UA <=5 /hpf 1  Red Blood Cells, Urinalysis <=3 /hpf 1  Bacteria, Urinalysis 0 - 5 /hpf 0-5  Squamous  Epithelial Cells, Urinalysis /hpf 0  Resulting Agency [facility]   Component Ref Range & Units 2 wk ago  Glucose 70 - 110 mg/dL 644  Sodium 034 - 742 mmol/L 141  Potassium 3.6 - 5.1 mmol/L 4.3  Chloride 97 - 109 mmol/L 105  Carbon Dioxide (CO2) 22.0 - 32.0 mmol/L 27.0  Urea Nitrogen (BUN) 7 - 25 mg/dL 13  Creatinine 0.7 - 1.3 mg/dL 1.1  Glomerular Filtration Rate (eGFR) >60 mL/min/1.73sq m 81  Comment: CKD-EPI (2021) does not include patient's race in the calculation of eGFR.  Monitoring changes of plasma creatinine and eGFR over time is useful for monitoring kidney function.  Interpretive Ranges for eGFR (CKD-EPI 2021):  eGFR:       >60 mL/min/1.73 sq. m - Normal eGFR:       30-59 mL/min/1.73 sq. m - Moderately Decreased eGFR:       15-29 mL/min/1.73 sq. m  - Severely Decreased eGFR:       < 15 mL/min/1.73 sq. m  - Kidney Failure   Note: These eGFR calculations do not apply in acute situations when eGFR is changing rapidly or patients on dialysis.  Calcium 8.7 - 10.3 mg/dL 9.2  AST 8 - 39 U/L 15  ALT 6 - 57 U/L 23  Alk Phos (alkaline Phosphatase) 34 - 104 U/L 93  Albumin 3.5 - 4.8 g/dL 4.4  Bilirubin, Total 0.3 - 1.2 mg/dL 0.7  Protein, Total 6.1 - 7.9 g/dL 6.8  A/G Ratio     WBC (White Blood Cell Count) 4.1 - 10.2 10^3/uL 7.3  RBC (Red Blood Cell Count) 4.69 - 6.13 10^6/uL 5.13  Hemoglobin 14.1 - 18.1 gm/dL 59.5  Hematocrit 63.8 - 52.0 % 47.1  MCV (Mean Corpuscular Volume) 80.0 - 100.0 fl 91.8  MCH (Mean Corpuscular Hemoglobin) 27.0 - 31.2 pg 31.2  MCHC (Mean Corpuscular Hemoglobin Concentration) 32.0 - 36.0 gm/dL 75.6  Platelet Count 433 - 450 10^3/uL 265  RDW-CV (Red Cell Distribution Width) 11.6 - 14.8 % 12.6  MPV (Mean Platelet Volume) 9.4 - 12.4 fl 9.5  Neutrophils 1.50 - 7.80 10^3/uL 5.41  Lymphocytes 1.00 - 3.60 10^3/uL 1.14  Monocytes 0.00 - 1.50 10^3/uL 0.60  Eosinophils 0.00 - 0.55 10^3/uL 0.08  Basophils 0.00  - 0.09 10^3/uL 0.04  Neutrophil % 32.0 - 70.0 % 74.2 High   Lymphocyte % 10.0 - 50.0 % 15.6  Monocyte % 4.0 - 13.0 % 8.2  Eosinophil % 1.0 - 5.0 % 1.1  Basophil% 0.0 - 2.0 % 0.5  Immature Granulocyte % <=0.7 % 0.4  Immature Granulocyte Count    Component Ref Range & Units 2 wk ago  Cholesterol, Total 100 - 200 mg/dL 295  Triglyceride 35 - 199 mg/dL 87  HDL (High Density Lipoprotein) Cholesterol 29.0 - 71.0 mg/dL  37.2  LDL Calculated 0 - 130 mg/dL 409 High   VLDL Cholesterol mg/dL 17  Component Ref Range & Units 2 wk ago  Thyroid Stimulating Hormone (TSH)       Component    ____________________________________________  EKG  Normal sinus rhythm 88 bpm ____________________________________________    ____________________________________________   INITIAL IMPRESSION / ASSESSMENT AND PLAN   As part of my medical decision making, I reviewed the following data within the electronic MEDICAL RECORD NUMBER      No acute findings on physical exam and EKG.  Labs reveal elevated PSA.  Patient is followed by urologist.        ____________________________________________   FINAL CLINICAL IMPRESSION Well exam   ED Discharge Orders     None        Note:  This document was prepared using Dragon voice recognition software and may include unintentional dictation errors.

## 2023-06-25 NOTE — Progress Notes (Signed)
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 Walter Edwards

## 2023-06-26 NOTE — Discharge Instructions (Signed)

## 2023-06-27 ENCOUNTER — Ambulatory Visit
Admission: RE | Admit: 2023-06-27 | Discharge: 2023-06-27 | Disposition: A | Discharge status: Home / Self Care | Payer: 59 | Source: Ambulatory Visit | Attending: Family Medicine | Admitting: Family Medicine

## 2023-06-27 DIAGNOSIS — M47894 Other spondylosis, thoracic region: Secondary | ICD-10-CM | POA: Diagnosis not present

## 2023-06-27 DIAGNOSIS — M47814 Spondylosis without myelopathy or radiculopathy, thoracic region: Secondary | ICD-10-CM

## 2023-06-27 MED ORDER — IOPAMIDOL (ISOVUE-M 200) INJECTION 41%
1.0000 mL | Freq: Once | INTRAMUSCULAR | Status: AC
  Administered 2023-06-27: 1 mL via INTRA_ARTICULAR

## 2023-06-27 MED ORDER — METHYLPREDNISOLONE ACETATE 40 MG/ML INJ SUSP (RADIOLOG
80.0000 mg | Freq: Once | INTRAMUSCULAR | Status: AC
  Administered 2023-06-27: 80 mg via INTRA_ARTICULAR

## 2023-07-02 DIAGNOSIS — J301 Allergic rhinitis due to pollen: Secondary | ICD-10-CM | POA: Diagnosis not present

## 2023-07-09 DIAGNOSIS — J301 Allergic rhinitis due to pollen: Secondary | ICD-10-CM | POA: Diagnosis not present

## 2023-07-10 ENCOUNTER — Encounter: Payer: Self-pay | Admitting: Urology

## 2023-07-10 ENCOUNTER — Ambulatory Visit (INDEPENDENT_AMBULATORY_CARE_PROVIDER_SITE_OTHER): Payer: 59 | Admitting: Urology

## 2023-07-10 VITALS — BP 130/86 | HR 98 | Ht 70.0 in | Wt 236.0 lb

## 2023-07-10 DIAGNOSIS — R972 Elevated prostate specific antigen [PSA]: Secondary | ICD-10-CM | POA: Diagnosis not present

## 2023-07-10 DIAGNOSIS — Z87898 Personal history of other specified conditions: Secondary | ICD-10-CM

## 2023-07-10 DIAGNOSIS — J301 Allergic rhinitis due to pollen: Secondary | ICD-10-CM | POA: Diagnosis not present

## 2023-07-10 NOTE — Progress Notes (Signed)
I,Walter Edwards,acting as a scribe for Riki Altes, MD.,have documented all relevant documentation on the behalf of Riki Altes, MD,as directed by  Riki Altes, MD while in the presence of Riki Altes, MD.  07/10/2023 1:45 PM   [name] 01/23/1971 [id number]  Referring provider: Marguarite Arbour, MD [address] [facility] [place],  [place] [address]  Chief Complaint  Patient presents with   Elevated PSA   Urologic history:  1. Elevated PSA Prostate biopsy at [facility] in October 2023 for a PSA of 4.89 with benign pathology.  HPI: AUM TEER is a 52 y.o.  male who presents today for a follow-up.  No complaints since last visit. Repeat PSA on 06/06/2023 was stable at 4.15 No bothersome LUTS. Denies dysuria, gross hematuria, and flank/ abdominal/ pelvic pain.   PMH: Past Medical History:  Diagnosis Date   Allergy    Chronic back pain    Gastritis    GERD (gastroesophageal reflux disease)    History of kidney stones    Hyperlipidemia    Hypertension    Inguinal hernia    Internal hemorrhoids    Low HDL (under 40)    Mild depression    Otitis media    Seasonal rhinitis     Surgical History: Past Surgical History:  Procedure Laterality Date   BACK SURGERY     x3   CHOLECYSTECTOMY  06/02/2020   CHOLECYSTECTOMY N/A 06/02/2020   Procedure: LAPAROSCOPIC CHOLECYSTECTOMY;  Surgeon: [name], MD;  Location: [facility];  Service: General;  Laterality: N/A;   COLONOSCOPY     ENDOSCOPIC PLANTAR FASCIOTOMY  05/23/2020   VENTRAL HERNIA REPAIR N/A 07/17/2021   Procedure: Caren Macadam HERNIA REPAIR;  Surgeon: [name], MD;  Location: [facility];  Service: General;  Laterality: N/A;    Home Medications:  Allergies as of 07/10/2023       Reactions   Hydrocodone Itching   Tessalon Perles [benzonatate] Rash   itching        Medication List        Accurate as of July 10, 2023  1:45 PM. If you  have any questions, ask your nurse or doctor.          STOP taking these medications    HYDROcodone-acetaminophen 10-325 MG tablet Commonly known as: NORCO Stopped by: Riki Altes       TAKE these medications    amLODipine 10 MG tablet Commonly known as: NORVASC Take 1 tablet (10 mg total) by mouth daily.   azelastine 0.1 % nasal spray Commonly known as: ASTELIN Place into both nostrils as needed.   carbamazepine 200 MG tablet Commonly known as: TEGRETOL Take 200 mg by mouth daily.   cyclobenzaprine 10 MG tablet Commonly known as: FLEXERIL Take 10 mg by mouth daily as needed (Back pain).   diphenhydrAMINE 25 MG tablet Commonly known as: BENADRYL Take 25 mg by mouth See admin instructions. Takes with hydrocodone to prevent itching as needed   docusate sodium 250 MG capsule Commonly known as: COLACE Take 500 mg by mouth daily after lunch.   DULoxetine 30 MG capsule Commonly known as: CYMBALTA Take 30 mg by mouth daily.   EPINEPHrine 0.3 mg/0.3 mL Soaj injection Commonly known as: EPI-PEN Inject 0.3 mg into the muscle as needed.   EQ STOOL SOFTENER/LAXATIVE PO Take 1 tablet by mouth daily.   fexofenadine 180 MG tablet Commonly known as: ALLEGRA Take 1 tablet (  180 mg total) by mouth daily.   ibuprofen 800 MG tablet Commonly known as: ADVIL Take 1 tablet by mouth 3 (three) times daily as needed.   lubiprostone 24 MCG capsule Commonly known as: AMITIZA TAKE ONE CAPSULE BY MOUTH TWICE DAILY WITH A MEAL   montelukast 10 MG tablet Commonly known as: SINGULAIR TAKE 1 TABLET BY MOUTH EVERY MORNING FORALLERGIES   multivitamin tablet Take 1 tablet by mouth daily.   Nucynta 50 MG tablet Generic drug: tapentadol Take 50 mg by mouth 3 (three) times daily as needed.   pantoprazole 40 MG tablet Commonly known as: PROTONIX TAKE 1 TABLET BY MOUTH ONCE A DAY   polyethylene glycol powder 17 GM/SCOOP powder Commonly known as: GLYCOLAX/MIRALAX Take 1 Scoop  by mouth as needed. Clear lax   pregabalin 150 MG capsule Commonly known as: LYRICA Take 150 mg by mouth 2 (two) times daily. What changed: Another medication with the same name was removed. Continue taking this medication, and follow the directions you see here. Changed by: Riki Altes   tamsulosin 0.4 MG Caps capsule Commonly known as: FLOMAX Take 0.4 mg by mouth daily.   triamcinolone 55 MCG/ACT Aero nasal inhaler Commonly known as: NASACORT Place 2 sprays into the nose daily.   Vit C-Quercet-Bioflv-Bromelain 450-250-125-50 MG Caps Take 2 Doses by mouth 3 (three) times daily between meals.        Allergies:  Allergies  Allergen Reactions   Hydrocodone Itching   Tessalon Perles [Benzonatate] Rash    itching    Family History: Family History  Problem Relation Age of Onset   Diabetes Mother    Anxiety disorder Mother    Hypertension Mother    Diabetes Father    Hypertension Father    Obesity Sister    Hypertension Sister    Colon cancer Neg Hx    Pancreatic cancer Neg Hx    Esophageal cancer Neg Hx    Liver cancer Neg Hx     Social History:  reports that he has never smoked. He has quit using smokeless tobacco. He reports current alcohol use. He reports that he does not use drugs.   Physical Exam: BP 130/86 (BP Location: Left Arm, Patient Position: Sitting, Cuff Size: Normal)   Pulse 98   Ht 5\' 10"  (1.778 m)   Wt 236 lb (107 kg)   BMI 33.86 kg/m   Constitutional:  Alert and oriented, No acute distress. HEENT: [place] AT Respiratory: Normal respiratory effort, no increased work of breathing. Psychiatric: Normal mood and affect.   Assessment & Plan:    1. Elevated PSA Current PSA slightly elevated but below baseline. He will continue annual follow-up with Dr. Judithann Sheen and return should his PSA increase more than his baseline of 4.89.  [facility], [facility] [address], [address] [place], [place] [address] [phone]

## 2023-07-13 ENCOUNTER — Encounter: Payer: Self-pay | Admitting: Urology

## 2023-07-16 DIAGNOSIS — J301 Allergic rhinitis due to pollen: Secondary | ICD-10-CM | POA: Diagnosis not present

## 2023-07-23 DIAGNOSIS — J301 Allergic rhinitis due to pollen: Secondary | ICD-10-CM | POA: Diagnosis not present

## 2023-07-30 DIAGNOSIS — J301 Allergic rhinitis due to pollen: Secondary | ICD-10-CM | POA: Diagnosis not present

## 2023-07-31 ENCOUNTER — Telehealth: Payer: Self-pay | Admitting: Gastroenterology

## 2023-07-31 NOTE — Telephone Encounter (Signed)
Patient called requesting to speak with a nurse stated he is still having a lot of RUQ pain. Please advise.

## 2023-08-01 NOTE — Telephone Encounter (Signed)
Pt has already been scheduled to see Dr. Rhea Belton 10/22/22 at 9:30am. Pt aware of appt.

## 2023-08-06 DIAGNOSIS — J301 Allergic rhinitis due to pollen: Secondary | ICD-10-CM | POA: Diagnosis not present

## 2023-08-13 DIAGNOSIS — J301 Allergic rhinitis due to pollen: Secondary | ICD-10-CM | POA: Diagnosis not present

## 2023-08-14 NOTE — Progress Notes (Signed)
[facility] [address] [place] [address] Phone: [phone] Subjective:   Bruce Donath, am serving as a scribe for Dr. Antoine Primas.  I'm seeing this patient by the request  of:  Marguarite Arbour, MD  CC: Abdominal pain  NGE:[id number]  05/22/2023 Patient given trigger point injections in the abdomen region today. We will see how patient responds. Discussed with patient that this is still very difficult. 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 Patient has been referred to pain management. Patient has seen nearly 20 doctors in 3 years which is starting to frustrate patient. He has had surgery for it as well for the gallbladder with no significant benefit. Epidural did not seem to make improvement and we could consider another level in the thoracic spine but at the moment we will continue this and see how patient responds. Another provider put him on Lyrica and this has titrated him up to 150 mg twice a day. Patient will follow-up with me again in 2 months otherwise. 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   Updated 08/20/2023 MASUO GOVEIA is a 52 y.o. male coming in with complaint of abdominal pain. Patient states that his pain worsened after injections last visit. Had epidural in September but was not able to get epidural that was ordered.       Past Medical History:  Diagnosis Date   Allergy    Chronic back pain    Gastritis    GERD (gastroesophageal reflux disease)    History of kidney stones    Hyperlipidemia    Hypertension    Inguinal hernia    Internal hemorrhoids    Low HDL (under 40)    Mild depression    Otitis media    Seasonal rhinitis    Past Surgical History:  Procedure Laterality Date   BACK SURGERY     x3   CHOLECYSTECTOMY  06/02/2020    CHOLECYSTECTOMY N/A 06/02/2020   Procedure: LAPAROSCOPIC CHOLECYSTECTOMY;  Surgeon: [name], MD;  Location: [facility];  Service: General;  Laterality: N/A;   COLONOSCOPY     ENDOSCOPIC PLANTAR FASCIOTOMY  05/23/2020   VENTRAL HERNIA REPAIR N/A 07/17/2021   Procedure: VENTRA HERNIA REPAIR;  Surgeon: [name], MD;  Location: [facility];  Service: General;  Laterality: N/A;   Social History   Socioeconomic History   Marital status: Single    Spouse name: Not on file   Number of children: 1   Years of education: [employer] education level: Not on file  Occupational History   Not on file  Tobacco Use   Smoking status: Never   Smokeless tobacco: Former  Building services engineer status: Never Used  Substance and Sexual Activity   Alcohol use: Yes    Comment: on the weekends maybe   Drug use: Never   Sexual activity: Yes    Birth control/protection: Condom  Other Topics Concern   Not on file  Social History Narrative   11/11/19   From: the area   Living: alone   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      Family: 1 daughter - Ladona Ridgel - 2003      Enjoys: relax, outdoor activity, hiking, concerts      Exercise: hiking, fire  house workouts 1 hour per day, 9-10K steps per day   Diet: had done low carb, but not doing great now, meat/veggies/fruit      Safety   Seat belts: Yes    Guns: Yes  and secure   Safe in relationships: Yes    Social Determinants of Health   Financial Resource Strain: Low Risk  (06/10/2023)   Received from [facility]   Overall Financial Resource Strain (CARDIA)    Difficulty of Paying Living Expenses: Not hard at all  Food Insecurity: No Food Insecurity (06/10/2023)   [facility] from [facility]   Hunger Vital Sign    Worried About Running Out of Food in the Last Year: Never true    Ran Out of Food in the Last Year: Never true  Transportation Needs: No  Transportation Needs (06/10/2023)   Received from [facility] - Transportation    In the past 12 months, has lack of transportation kept you from medical appointments or from getting medications?: No    Lack of Transportation (Non-Medical): No  Physical Activity: Not on file  Stress: Not on file  Social Connections: Unknown (04/30/2022)   Received from [facility]   Social Network    Social Network: Not on file   Allergies  Allergen Reactions   Hydrocodone Itching   Tessalon Perles [Benzonatate] Rash    itching   Family History  Problem Relation Age of Onset   Diabetes Mother    Anxiety disorder Mother    Hypertension Mother    Diabetes Father    Hypertension Father    Obesity Sister    Hypertension Sister    Colon cancer Neg Hx    Pancreatic cancer Neg Hx    Esophageal cancer Neg Hx    Liver cancer Neg Hx      Current Outpatient Medications (Cardiovascular):    amLODipine (NORVASC) 10 MG tablet, Take 1 tablet (10 mg total) by mouth daily.   EPINEPHrine 0.3 mg/0.3 mL IJ SOAJ injection, Inject 0.3 mg into the muscle as needed.  Current Outpatient Medications (Respiratory):    albuterol (VENTOLIN HFA) 108 (90 Base) MCG/ACT inhaler, Take 2 puffs once daily before exercise.   azelastine (ASTELIN) 0.1 % nasal spray, Place into both nostrils as needed.   diphenhydrAMINE (BENADRYL) 25 MG tablet, Take 25 mg by mouth See admin instructions. Takes with hydrocodone to prevent itching as needed   fexofenadine (ALLEGRA) 180 MG tablet, Take 1 tablet (180 mg total) by mouth daily.   montelukast (SINGULAIR) 10 MG tablet, TAKE 1 TABLET BY MOUTH EVERY MORNING FORALLERGIES   triamcinolone (NASACORT) 55 MCG/ACT AERO nasal inhaler, Place 2 sprays into the nose daily.  Current Outpatient Medications (Analgesics):    ibuprofen (ADVIL) 800 MG tablet, Take 1 tablet by mouth 3 (three) times daily as needed.   Current Outpatient Medications (Other):     carbamazepine (TEGRETOL) 200 MG tablet, Take 200 mg by mouth daily.   cyclobenzaprine (FLEXERIL) 10 MG tablet, Take 10 mg by mouth daily as needed (Back pain).    docusate sodium (COLACE) 250 MG capsule, Take 500 mg by mouth daily after lunch.   DULoxetine (CYMBALTA) 30 MG capsule, Take 30 mg by mouth daily.   lubiprostone (AMITIZA) 24 MCG capsule, TAKE ONE CAPSULE BY MOUTH TWICE DAILY WITH A MEAL   Multiple Vitamin (MULTIVITAMIN) tablet, Take 1 tablet by mouth daily.   pantoprazole (PROTONIX) 40 MG tablet, TAKE 1 TABLET  BY MOUTH ONCE A DAY   polyethylene glycol powder (GLYCOLAX/MIRALAX) 17 GM/SCOOP powder, Take 1 Scoop by mouth as needed. Clear lax   pregabalin (LYRICA) 150 MG capsule, Take 150 mg by mouth 2 (two) times daily.   Sennosides-Docusate Sodium (EQ STOOL SOFTENER/LAXATIVE PO), Take 1 tablet by mouth daily.   tamsulosin (FLOMAX) 0.4 MG CAPS capsule, Take 0.4 mg by mouth daily.   Vit C-Quercet-Bioflv-Bromelain 450-250-125-50 MG CAPS, Take 2 Doses by mouth 3 (three) times daily between meals.   Reviewed prior external information including notes and imaging from  primary care provider As well as notes that were available from care everywhere and other healthcare systems.  Past medical history, social, surgical and family history all reviewed in electronic medical record.  No pertanent information unless stated regarding to the chief complaint.   Review of Systems:  No headache, visual changes, nausea, vomiting, diarrhea, constipation, dizziness, abdominal pain, skin rash, fevers, chills, night sweats, weight loss, swollen lymph nodes, body aches, joint swelling, chest pain, shortness of breath, mood changes. POSITIVE muscle aches  Objective  Blood pressure (!) 140/88, pulse (!) 115, height 5\' 10"  (1.778 m), weight 244 lb (110.7 kg), SpO2 96%.   General: No apparent distress alert and oriented x3 mood and affect normal, dressed appropriately.  HEENT: Pupils equal, extraocular  movements intact  Respiratory: Patient's speak in full sentences and does not appear short of breath  Cardiovascular: No lower extremity edema, non tender, no erythema  On exam patient is still somewhat tender just right to the midline in the epigastric region.  No significant masses appreciated.  Back exam still has loss lordosis noted mostly in the thoracolumbar junction.  Patient has some mild increase in discomfort in the abdominal area with a deep inhalation but very minimal.    Impression and Recommendations:    The above documentation has been reviewed and is accurate and complete Judi Saa, DO

## 2023-08-20 ENCOUNTER — Ambulatory Visit (INDEPENDENT_AMBULATORY_CARE_PROVIDER_SITE_OTHER): Payer: 59

## 2023-08-20 ENCOUNTER — Other Ambulatory Visit: Payer: Self-pay

## 2023-08-20 ENCOUNTER — Ambulatory Visit (INDEPENDENT_AMBULATORY_CARE_PROVIDER_SITE_OTHER): Payer: 59 | Admitting: Family Medicine

## 2023-08-20 VITALS — BP 140/88 | HR 115 | Ht 70.0 in | Wt 244.0 lb

## 2023-08-20 DIAGNOSIS — R109 Unspecified abdominal pain: Secondary | ICD-10-CM | POA: Diagnosis not present

## 2023-08-20 DIAGNOSIS — M7918 Myalgia, other site: Secondary | ICD-10-CM | POA: Diagnosis not present

## 2023-08-20 DIAGNOSIS — J301 Allergic rhinitis due to pollen: Secondary | ICD-10-CM | POA: Diagnosis not present

## 2023-08-20 DIAGNOSIS — R079 Chest pain, unspecified: Secondary | ICD-10-CM | POA: Diagnosis not present

## 2023-08-20 MED ORDER — ALBUTEROL SULFATE HFA 108 (90 BASE) MCG/ACT IN AERS: INHALATION_SPRAY | RESPIRATORY_TRACT | 2 refills | Status: DC

## 2023-08-20 NOTE — Patient Instructions (Addendum)
Albuterol 2 puffs daily prior to exercise Chest xray CT Chest w/o contrast We will be in touch

## 2023-08-21 ENCOUNTER — Encounter: Payer: Self-pay | Admitting: Family Medicine

## 2023-08-21 NOTE — Assessment & Plan Note (Addendum)
Patient is extremely frustrated. Do feel bad for patient.  Full workup has been seem to be exhausted at this moment.  Continues to have significant amount of pain.  Still has pain that is affecting daily activities and keeping him from job performance.  Does seem to get worse with activity.  Multiple imaging studies have not shown a slight findings.  Concern with patient's long-term functionality with [place] not finding anything to help with treatment.  Only thing we have not quite treated would be some diaphragmatic issue.  Will give albuterol to try before activities in place there is any pulmonary aspect that could be contributing.  We did discuss the possibility of CT scan of the lungs as well depending on how patient responds to further evaluate, total time 31 minutes with discussing with patient as well as reviewing all previous imaging and outside records.

## 2023-08-26 ENCOUNTER — Ambulatory Visit: Payer: Self-pay

## 2023-08-26 DIAGNOSIS — Z23 Encounter for immunization: Secondary | ICD-10-CM

## 2023-08-27 ENCOUNTER — Inpatient Hospital Stay
Admission: RE | Admit: 2023-08-27 | Discharge: 2023-08-27 | Disposition: A | Discharge status: Home / Self Care | Payer: 59 | Source: Ambulatory Visit | Attending: Family Medicine | Admitting: Family Medicine

## 2023-08-27 DIAGNOSIS — R079 Chest pain, unspecified: Secondary | ICD-10-CM

## 2023-08-27 DIAGNOSIS — J301 Allergic rhinitis due to pollen: Secondary | ICD-10-CM | POA: Diagnosis not present

## 2023-08-27 DIAGNOSIS — M7918 Myalgia, other site: Secondary | ICD-10-CM

## 2023-09-03 DIAGNOSIS — J301 Allergic rhinitis due to pollen: Secondary | ICD-10-CM | POA: Diagnosis not present

## 2023-09-04 DIAGNOSIS — J301 Allergic rhinitis due to pollen: Secondary | ICD-10-CM | POA: Diagnosis not present

## 2023-09-10 DIAGNOSIS — J301 Allergic rhinitis due to pollen: Secondary | ICD-10-CM | POA: Diagnosis not present

## 2023-09-15 ENCOUNTER — Encounter: Payer: Self-pay | Admitting: Family Medicine

## 2023-09-16 ENCOUNTER — Other Ambulatory Visit: Payer: Self-pay

## 2023-09-16 DIAGNOSIS — K429 Umbilical hernia without obstruction or gangrene: Secondary | ICD-10-CM

## 2023-09-17 DIAGNOSIS — J301 Allergic rhinitis due to pollen: Secondary | ICD-10-CM | POA: Diagnosis not present

## 2023-09-24 DIAGNOSIS — J301 Allergic rhinitis due to pollen: Secondary | ICD-10-CM | POA: Diagnosis not present

## 2023-09-24 DIAGNOSIS — R21 Rash and other nonspecific skin eruption: Secondary | ICD-10-CM | POA: Diagnosis not present

## 2023-09-30 DIAGNOSIS — J301 Allergic rhinitis due to pollen: Secondary | ICD-10-CM | POA: Diagnosis not present

## 2023-10-01 DIAGNOSIS — J301 Allergic rhinitis due to pollen: Secondary | ICD-10-CM | POA: Diagnosis not present

## 2023-10-03 ENCOUNTER — Other Ambulatory Visit: Payer: Self-pay | Admitting: Surgery

## 2023-10-03 DIAGNOSIS — K430 Incisional hernia with obstruction, without gangrene: Secondary | ICD-10-CM | POA: Diagnosis not present

## 2023-10-08 DIAGNOSIS — J301 Allergic rhinitis due to pollen: Secondary | ICD-10-CM | POA: Diagnosis not present

## 2023-10-21 ENCOUNTER — Encounter: Payer: Self-pay | Admitting: Family Medicine

## 2023-10-21 ENCOUNTER — Encounter: Payer: Self-pay | Admitting: Gastroenterology

## 2023-10-23 ENCOUNTER — Encounter: Payer: Self-pay | Admitting: Internal Medicine

## 2023-10-23 ENCOUNTER — Ambulatory Visit: Payer: Self-pay | Admitting: Internal Medicine

## 2023-10-23 VITALS — BP 130/76 | HR 82 | Ht 70.0 in | Wt 239.2 lb

## 2023-10-23 DIAGNOSIS — K5900 Constipation, unspecified: Secondary | ICD-10-CM

## 2023-10-23 DIAGNOSIS — K649 Unspecified hemorrhoids: Secondary | ICD-10-CM | POA: Diagnosis not present

## 2023-10-23 DIAGNOSIS — K5903 Drug induced constipation: Secondary | ICD-10-CM

## 2023-10-23 DIAGNOSIS — K439 Ventral hernia without obstruction or gangrene: Secondary | ICD-10-CM | POA: Diagnosis not present

## 2023-10-23 DIAGNOSIS — K219 Gastro-esophageal reflux disease without esophagitis: Secondary | ICD-10-CM

## 2023-10-23 DIAGNOSIS — K5909 Other constipation: Secondary | ICD-10-CM

## 2023-10-23 DIAGNOSIS — R1011 Right upper quadrant pain: Secondary | ICD-10-CM

## 2023-10-23 DIAGNOSIS — K921 Melena: Secondary | ICD-10-CM | POA: Diagnosis not present

## 2023-10-23 MED ORDER — NALOXEGOL OXALATE 25 MG PO TABS: 25.0000 mg | ORAL_TABLET | Freq: Every day | ORAL | 5 refills | Status: AC

## 2023-10-23 NOTE — Progress Notes (Signed)
 Subjective:    Patient ID: Walter Edwards, male    DOB: October 20, 1971, 53 y.o.   MRN: [id number]  HPI Walter Edwards is a 53 year old male with a history of chronic right upper quadrant pain, chronic constipation, previous cholecystectomy for gallbladder disease, history of umbilical and inguinal hernias, history of GERD who is here for follow-up.  He was last seen 1 year ago for consideration of trigger point injection which was not done due to negative Carnett's sign.  EGD 09/24/2022 -- Mild gastritis; esophageal biopsies suggestive of mild reflux change; gastric biopsies reactive gastropathy without H. Pylori; normal duodenal biopsies  The patient, with a history of chronic pain, presents with persistent discomfort in the right upper quadrant. The pain is described as severe enough to limit daily activities, including walking the dogs and yard work. The pain is reportedly exacerbated by physical activity and relieved by rest. The patient also reports an associated need for bowel movements, with pain intensifying if a bathroom is not immediately accessible. The pain reportedly subsides following a bowel movement.  The patient has been under the care of sports medicine, Dr. Darlyn Sharps, who administered injections to the painful area. A subsequent CT scan revealed a supraumbilical hernia, for which the patient is scheduled to undergo surgery in two weeks. The patient expresses hope that the surgery will alleviate the pain.  In addition to the pain, the patient reports irregular bowel movements, sometimes requiring the use of Amitiza  (lubiprostone ) 24 mcg up to three times a day. The patient notes that periods of inactivity or changes in hydration can lead to constipation, which is sometimes managed with Miralax. The patient also reports occasional blood in the stool, suspected to be due to internal hemorrhoids.  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 The patient estimates current usage at approximately two days per month. The patient acknowledges that hydrocodone use can contribute to constipation and adjusts the Amitiza  dosage accordingly.  Uses Amitiza  24 mcg twice daily but occasionally 3 times daily.  He is careful because this can cause him to run out before the next prescription is available.  The patient also reports a history of reflux, managed with pantoprazole . The patient notes that dietary changes, such as reducing the intake of hot, spicy foods, have also helped manage this condition.  Review of Systems As per HPI, otherwise negative  Current Medications, Allergies, Past Medical History, Past Surgical History, Family History and Social History were reviewed in [employer] record.    Objective:   Physical Exam BP 130/76   Pulse 82   Ht 5' 10 (1.778 m)   Wt 239 lb 3.2 oz (108.5 kg)   SpO2 97%   BMI 34.32 kg/m  Gen: awake, alert, NAD HEENT: anicteric  CV: RRR, no mrg Pulm: CTA b/l Abd: soft, supra umbilical hernia at prior incision site, nondistended, +BS throughout Ext: no c/c/e Neuro: nonfocal  CT CHEST WITHOUT CONTRAST   TECHNIQUE: Multidetector CT imaging of the chest was performed following the standard protocol without IV contrast.   RADIATION DOSE REDUCTION: This exam was performed according to the departmental dose-optimization program which includes automated exposure control, adjustment of the mA and/or kV according to patient size and/or use of iterative reconstruction technique.   COMPARISON:  Chest radiograph 08/20/2023   FINDINGS: Cardiovascular: Normal heart size. No pericardial effusion. No significant coronary artery or aortic calcifications.  Mediastinum/Nodes: No enlarged mediastinal or axillary lymph nodes. Thyroid gland, trachea, and esophagus demonstrate no significant findings.    Lungs/Pleura: No pleural effusion. Scarring noted within the lower lobes. No airspace consolidation, pneumothorax or signs of interstitial edema. No suspicious pulmonary nodule or mass.   Upper Abdomen: No acute abnormality. Cholecystectomy. Wide-mouth midline supraumbilical ventral abdominal wall hernia has a diameter of 5.8 cm. This contains fat only.   Musculoskeletal: The sternum is intact. The vertebral body heights are well preserved. No signs of acute fracture. No chest wall mass identified.   IMPRESSION: 1. No acute cardiopulmonary abnormalities. 2. No signs of acute fracture or chest wall mass. 3. Wide-mouth midline supraumbilical ventral abdominal wall hernia contains fat only.     Electronically Signed   By: Waddell Calk M.D.   On: 09/12/2023 08:25  MRI ABDOMEN WITHOUT AND WITH CONTRAST   TECHNIQUE: Multiplanar multisequence MR imaging of the abdomen was performed both before and after the administration of intravenous contrast.   CONTRAST:  10mL GADAVIST  GADOBUTROL  1 MMOL/ML IV SOLN   COMPARISON:  MRI 12/03/2021   FINDINGS: Lower chest:  Lung bases are clear.   Hepatobiliary: No biliary duct dilatation. Common bile duct normal caliber. Post cholecystectomy. No focal hepatic lesion. No hepatic steatosis.   Pancreas: Normal pancreatic parenchymal intensity. No ductal dilatation or inflammation.   Spleen: Normal spleen.   Adrenals/urinary tract: Adrenal glands and kidneys are normal.   Stomach/Bowel: Stomach and limited of the small bowel is unremarkable   Vascular/Lymphatic: Abdominal aortic normal caliber. No retroperitoneal periportal lymphadenopathy.   Musculoskeletal: No aggressive osseous lesion   IMPRESSION: 1. Post cholecystectomy. 2. No choledocholithiasis. 3. Normal biliary tree.  Normal liver parenchyma. 4. Normal pancreas.     Electronically Signed   By: Jackquline Boxer M.D.   On: 01/31/2023 14:48     Assessment & Plan:   53 year old male with a history of chronic right upper quadrant pain, chronic constipation, previous cholecystectomy for gallbladder disease, history of umbilical and inguinal hernias, history of GERD who is here for follow-up.   Chronic RUQ Abdominal Pain Chronic right upper quadrant pain exacerbated by physical activity and relieved by bowel movements. Scheduled for hernia repair surgery with Dr. Vernetta on 11/04/2023.  This is felt to be more musculoskeletal than intestinal, query if there is some scar tissue related as well. -Ventral hernia repair scheduled with Dr. Vernetta with anticipation of mesh placement -Continue pantoprazole  40mg  for reflux.  Constipation Variable bowel habits with occasional constipation, currently managed with Amitiza  (lubiprostone ) 24 mcg 2 times daily and occasional Miralax. -Continue Amitiza  24 mcg twice daily. -Add Movantik  25 mg on days when opioids are used. -Okay for MiraLAX OTC as needed  GERD Controlled pantoprazole  continue 40 mg daily  Hematochezia Occasional blood in stool, likely due to internal hemorrhoids.  Medium size internal hemorrhoids seen at colonoscopy.  No history of polyps or colitis -Consider rubber band ligation after recovery from hernia surgery if bleeding persists.  Follow-up in 3-4 months post hernia repair surgery to reassess abdominal pain and bowel habits.

## 2023-10-23 NOTE — Patient Instructions (Addendum)
 Stay on pantoprazole  40 mg daily and Amitiza  24 mcg twice daily.   We have sent Movantik  25 mg to your pharmacy to take on the days you take opioids.   _______________________________________________________  If your blood pressure at your visit was 140/90 or greater, please contact your primary care physician to follow up on this.  _______________________________________________________  If you are age 53 or older, your body mass index should be between 23-30. Your Body mass index is 34.32 kg/m. If this is out of the aforementioned range listed, please consider follow up with your Primary Care Provider.  If you are age 56 or younger, your body mass index should be between 19-25. Your Body mass index is 34.32 kg/m. If this is out of the aformentioned range listed, please consider follow up with your Primary Care Provider.   ________________________________________________________  The [place] [facility] providers would like to encourage you to use MYCHART to communicate with providers for non-urgent requests or questions.  Due to long hold times on the telephone, sending your provider a message by [facility] may be a faster and more efficient way to get a response.  Please allow 48 business hours for a response.  Please remember that this is for non-urgent requests.  _______________________________________________________

## 2023-10-28 ENCOUNTER — Other Ambulatory Visit: Payer: Self-pay

## 2023-10-28 ENCOUNTER — Encounter (HOSPITAL_BASED_OUTPATIENT_CLINIC_OR_DEPARTMENT_OTHER): Payer: Self-pay | Admitting: Surgery

## 2023-10-28 MED ORDER — CHLORHEXIDINE GLUCONATE CLOTH 2 % EX PADS: 6.0000 | MEDICATED_PAD | Freq: Once | CUTANEOUS | Status: DC

## 2023-10-28 MED ORDER — ENSURE PRE-SURGERY PO LIQD: 296.0000 mL | Freq: Once | ORAL | Status: DC

## 2023-10-28 NOTE — Progress Notes (Signed)

## 2023-10-29 DIAGNOSIS — J301 Allergic rhinitis due to pollen: Secondary | ICD-10-CM | POA: Diagnosis not present

## 2023-11-03 NOTE — H&P (Signed)
 PROVIDER: VICENTA DASIE POLI, MD  MRN: [id number] DOB: [birth year]  Subjective   Chief Complaint: Follow-up (HERNIA/)   History of Present Illness: Walter Edwards is a 53 y.o. male who is seen for recurrent incisional hernia. He is well-known to me. I performed a laparoscopic cholecystectomy on him for right upper quadrant abdominal pain and epigastric pain in 2021. This did not resolve some of his discomfort. He then presented the following year without a hernia near the epigastrium which was repaired primarily as the fascial defect was only 5 to 7 mm. He is continue to have intermittent pain and even has had spinal injections without improvement. He reports he will have epigastric abdominal pain hurting to the right upper quadrant which would then be relieved with passing gas. He has had no nausea or vomiting. Recently had a CT of the chest to evaluate chest pain and this demonstrated a recurrent hernia with a moderate fascial defect in the upper mid abdomen. His cardiac workup was unremarkable.    Review of Systems: A complete review of systems was obtained from the patient. I have reviewed this information and discussed as appropriate with the patient. See HPI as well for other ROS.  ROS   Medical History: Past Medical History:  Diagnosis Date  Allergy 2010  GERD (gastroesophageal reflux disease) 2014  Hypertension 2019   Patient Active Problem List  Diagnosis  Athlete's foot on left  Acute back pain with sciatica  Backache  Chronic back pain  Chronic low back pain  Chronic lumbosacral pain  Other chronic pain  Cyst on ear  DDD (degenerative disc disease), lumbosacral  Elevated PSA, less than 10 ng/ml  Essential hypertension  Fatty liver  Gastroesophageal reflux disease  History of fusion of lumbar spine  Plantar fasciitis  RUQ abdominal pain  Seasonal allergic rhinitis  Class 1 obesity due to excess calories with serious comorbidity and body mass index (BMI) of  33.0 to 33.9 in adult   Past Surgical History:  Procedure Laterality Date  CHOLECYSTECTOMY 05/2020  REPAIR UMBILICAL HERNIA 06/21/2021  COLON SURGERY 2022  HERNIA REPAIR 2022    Allergies  Allergen Reactions  Hydrocodone Itching  Benzonatate Rash  itching   Current Outpatient Medications on File Prior to Visit  Medication Sig Dispense Refill  amLODIPine  (NORVASC ) 10 MG tablet TAKE ONE TABLET BY MOUTH ONCE A DAY 90 tablet 1  carBAMazepine (TEGRETOL) 200 mg tablet Take 0.5 tablets (100 mg total) by mouth 2 (two) times daily 30 tablet 5  cyclobenzaprine (FLEXERIL) 10 MG tablet Take 1 tablet by mouth 3 (three) times daily as needed for Muscle spasms  docusate (COLACE) 250 MG capsule Take by mouth  DULoxetine (CYMBALTA) 30 MG DR capsule TAKE ONE CAPSULE BY MOUTH ONCE DAILY 30 capsule 2  EPINEPHrine  (EPIPEN ) 0.3 mg/0.3 mL auto-injector  fexofenadine  (ALLEGRA ) 180 MG tablet Take by mouth  HYDROcodone-acetaminophen  (NORCO) 10-325 mg tablet Take by mouth every 8 (eight) hours  hydrOXYzine (ATARAX) 25 MG tablet Take 1 tablet (25 mg total) by mouth 3 (three) times a day for 10 days 30 tablet 0  ibuprofen (MOTRIN) 800 MG tablet Take 800 mg by mouth 3 (three) times daily  lubiprostone  (AMITIZA ) 24 MCG capsule Take 24 mcg by mouth 2 (two) times daily with meals  montelukast  (SINGULAIR ) 10 mg tablet TAKE ONE TABLET BY MOUTH EVERY MORNING FOR ALLERGIES 90 tablet 1  multivitamin tablet Take 1 tablet by mouth once daily  pantoprazole  (PROTONIX ) 40 MG DR tablet take one tablet  by mouth once a day 90 tablet 3  polyethylene glycol (MIRALAX) powder Take by mouth  tamsulosin (FLOMAX) 0.4 mg capsule TAKE ONE CAPSULE BY MOUTH ONCE DAILY. TAKE 30 MINS AFTER SAME MEAL EVERY DAY 90 capsule 1   No current facility-administered medications on file prior to visit.   Family History  Problem Relation Age of Onset  High blood pressure (Hypertension) Mother  Hyperlipidemia (Elevated cholesterol) Mother   Diabetes Mother  Bipolar disorder Mother  Colon polyps Mother  Depression Mother  Diabetes type II Mother  High blood pressure (Hypertension) Father  Hyperlipidemia (Elevated cholesterol) Father  Diabetes Father  High blood pressure (Hypertension) Sister  Alzheimer's disease Paternal Grandfather    Social History   Tobacco Use  Smoking Status Never  Smokeless Tobacco Never  Tobacco Comments  Stopped dipping and chewing in 2001    Social History   Socioeconomic History  Marital status: Single  Tobacco Use  Smoking status: Never  Smokeless tobacco: Never  Tobacco comments:  Stopped dipping and chewing in 2001  Vaping Use  Vaping status: Never Used  Substance and Sexual Activity  Alcohol use: Yes  Alcohol/week: 3.0 standard drinks of alcohol  Types: 3 Cans of beer per week  Drug use: Never  Sexual activity: Yes  Partners: Female  Birth control/protection: Condom   Social Drivers of Corporate Investment Banker Strain: Low Risk (06/10/2023)  Overall Financial Resource Strain (CARDIA)  Difficulty of Paying Living Expenses: Not hard at all  Food Insecurity: No Food Insecurity (06/10/2023)  Hunger Vital Sign  Worried About Running Out of Food in the Last Year: Never true  Ran Out of Food in the Last Year: Never true  Transportation Needs: No Transportation Needs (06/10/2023)  PRAPARE - Risk Analyst (Medical): No  Lack of Transportation (Non-Medical): No  Received from [facility]  Social Network  Housing Stability: Unknown (06/10/2023)  Housing Stability Vital Sign  Unable to Pay for Housing in the Last Year: No  Homeless in the Last Year: No   Objective:   Vitals:   PainSc: 4   There is no height or weight on file to calculate BMI.  Physical Exam   He appears well on exam  He has a difficult to feel but palpable hernia in his upper abdomen closer to the xiphoid. The fascial defect is approximately 4 cm. It  contains incarcerated omentum. It is mildly tender but not reducible. His old incision is well-healed  Labs, Imaging and Diagnostic Testing:  I reviewed his notes in the electronic medical records and the CT scan of his chest demonstrating the hernia  Assessment and Plan:   Diagnoses and all orders for this visit:  Recurrent incisional hernia with incarceration    He definitely has a recurrent hernia based on the CT scan and physical examination with a 4 cm fascial defect. Repair of the hernia is recommended. I again discussed both the laparoscopic and open techniques. 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 We again discussed the surgical procedure in detail. We discussed the risks which includes but is not limited to bleeding, infection, injury to surrounding structures, use of mesh, the chance it may not resolve any of his abdominal discomfort, hernia recurrence, postoperative recovery, etc. He understands and wished to proceed with surgery which will be scheduled

## 2023-11-04 ENCOUNTER — Encounter (HOSPITAL_BASED_OUTPATIENT_CLINIC_OR_DEPARTMENT_OTHER): Payer: Self-pay | Admitting: Surgery

## 2023-11-04 ENCOUNTER — Ambulatory Visit (HOSPITAL_BASED_OUTPATIENT_CLINIC_OR_DEPARTMENT_OTHER): Payer: 59 | Admitting: Anesthesiology

## 2023-11-04 ENCOUNTER — Ambulatory Visit (HOSPITAL_BASED_OUTPATIENT_CLINIC_OR_DEPARTMENT_OTHER)
Admission: RE | Admit: 2023-11-04 | Discharge: 2023-11-04 | Disposition: A | Discharge status: Home / Self Care | Payer: 59 | Attending: Surgery | Admitting: Surgery

## 2023-11-04 ENCOUNTER — Other Ambulatory Visit: Payer: Self-pay

## 2023-11-04 ENCOUNTER — Encounter (HOSPITAL_BASED_OUTPATIENT_CLINIC_OR_DEPARTMENT_OTHER)
Admission: RE | Disposition: A | Discharge status: Home / Self Care | Payer: Self-pay | Source: Home / Self Care | Attending: Surgery

## 2023-11-04 DIAGNOSIS — K430 Incisional hernia with obstruction, without gangrene: Secondary | ICD-10-CM | POA: Insufficient documentation

## 2023-11-04 DIAGNOSIS — I10 Essential (primary) hypertension: Secondary | ICD-10-CM | POA: Insufficient documentation

## 2023-11-04 DIAGNOSIS — M1990 Unspecified osteoarthritis, unspecified site: Secondary | ICD-10-CM | POA: Diagnosis not present

## 2023-11-04 DIAGNOSIS — Z79899 Other long term (current) drug therapy: Secondary | ICD-10-CM | POA: Diagnosis not present

## 2023-11-04 DIAGNOSIS — G8929 Other chronic pain: Secondary | ICD-10-CM | POA: Insufficient documentation

## 2023-11-04 DIAGNOSIS — K432 Incisional hernia without obstruction or gangrene: Secondary | ICD-10-CM

## 2023-11-04 DIAGNOSIS — K760 Fatty (change of) liver, not elsewhere classified: Secondary | ICD-10-CM | POA: Diagnosis not present

## 2023-11-04 DIAGNOSIS — M545 Low back pain, unspecified: Secondary | ICD-10-CM | POA: Insufficient documentation

## 2023-11-04 DIAGNOSIS — K219 Gastro-esophageal reflux disease without esophagitis: Secondary | ICD-10-CM | POA: Diagnosis not present

## 2023-11-04 DIAGNOSIS — Z01818 Encounter for other preprocedural examination: Secondary | ICD-10-CM

## 2023-11-04 HISTORY — PX: INCISIONAL HERNIA REPAIR: SHX193

## 2023-11-04 SURGERY — REPAIR, HERNIA, INCISIONAL
Anesthesia: General | Site: Abdomen

## 2023-11-04 MED ORDER — PROPOFOL 10 MG/ML IV BOLUS
INTRAVENOUS | Status: AC
  Filled 2023-11-04: qty 20

## 2023-11-04 MED ORDER — MIDAZOLAM HCL 5 MG/5ML IJ SOLN
INTRAMUSCULAR | Status: DC | PRN
  Administered 2023-11-04: 2 mg via INTRAVENOUS

## 2023-11-04 MED ORDER — OXYCODONE HCL 5 MG PO TABS
5.0000 mg | ORAL_TABLET | Freq: Once | ORAL | Status: AC | PRN
  Administered 2023-11-04: 5 mg via ORAL

## 2023-11-04 MED ORDER — FENTANYL CITRATE (PF) 100 MCG/2ML IJ SOLN
INTRAMUSCULAR | Status: AC
  Filled 2023-11-04: qty 2

## 2023-11-04 MED ORDER — BUPIVACAINE-EPINEPHRINE (PF) 0.5% -1:200000 IJ SOLN
INTRAMUSCULAR | Status: AC
  Filled 2023-11-04: qty 30

## 2023-11-04 MED ORDER — CEFAZOLIN SODIUM-DEXTROSE 2-4 GM/100ML-% IV SOLN
2.0000 g | INTRAVENOUS | Status: AC
  Administered 2023-11-04: 2 g via INTRAVENOUS

## 2023-11-04 MED ORDER — FENTANYL CITRATE (PF) 100 MCG/2ML IJ SOLN
25.0000 ug | INTRAMUSCULAR | Status: DC | PRN
  Administered 2023-11-04 (×2): 50 ug via INTRAVENOUS

## 2023-11-04 MED ORDER — SODIUM CHLORIDE 0.9 % IV SOLN: INTRAVENOUS | Status: DC | PRN

## 2023-11-04 MED ORDER — ACETAMINOPHEN 500 MG PO TABS
1000.0000 mg | ORAL_TABLET | ORAL | Status: AC
  Administered 2023-11-04: 1000 mg via ORAL

## 2023-11-04 MED ORDER — CEFAZOLIN SODIUM-DEXTROSE 2-4 GM/100ML-% IV SOLN
INTRAVENOUS | Status: AC
  Filled 2023-11-04: qty 100

## 2023-11-04 MED ORDER — LACTATED RINGERS IV SOLN: INTRAVENOUS | Status: DC

## 2023-11-04 MED ORDER — BUPIVACAINE-EPINEPHRINE 0.5% -1:200000 IJ SOLN
INTRAMUSCULAR | Status: DC | PRN
  Administered 2023-11-04: 20 mL

## 2023-11-04 MED ORDER — OXYCODONE HCL 5 MG PO TABS
ORAL_TABLET | ORAL | Status: AC
  Filled 2023-11-04: qty 1

## 2023-11-04 MED ORDER — ONDANSETRON HCL 4 MG/2ML IJ SOLN
INTRAMUSCULAR | Status: DC | PRN
  Administered 2023-11-04: 4 mg via INTRAVENOUS

## 2023-11-04 MED ORDER — DROPERIDOL 2.5 MG/ML IJ SOLN: 0.6250 mg | Freq: Once | INTRAMUSCULAR | Status: DC | PRN

## 2023-11-04 MED ORDER — OXYCODONE HCL 5 MG/5ML PO SOLN: 5.0000 mg | Freq: Once | ORAL | Status: AC | PRN

## 2023-11-04 MED ORDER — ROCURONIUM BROMIDE 10 MG/ML (PF) SYRINGE
PREFILLED_SYRINGE | INTRAVENOUS | Status: DC | PRN
  Administered 2023-11-04: 60 mg via INTRAVENOUS

## 2023-11-04 MED ORDER — DEXAMETHASONE SODIUM PHOSPHATE 10 MG/ML IJ SOLN
INTRAMUSCULAR | Status: DC | PRN
  Administered 2023-11-04: 10 mg via INTRAVENOUS

## 2023-11-04 MED ORDER — DEXAMETHASONE SODIUM PHOSPHATE 10 MG/ML IJ SOLN
INTRAMUSCULAR | Status: AC
  Filled 2023-11-04: qty 1

## 2023-11-04 MED ORDER — ACETAMINOPHEN 500 MG PO TABS
ORAL_TABLET | ORAL | Status: AC
  Filled 2023-11-04: qty 2

## 2023-11-04 MED ORDER — FENTANYL CITRATE (PF) 250 MCG/5ML IJ SOLN
INTRAMUSCULAR | Status: DC | PRN
  Administered 2023-11-04: 100 ug via INTRAVENOUS

## 2023-11-04 MED ORDER — SUGAMMADEX SODIUM 200 MG/2ML IV SOLN
INTRAVENOUS | Status: DC | PRN
  Administered 2023-11-04: 200 mg via INTRAVENOUS
  Administered 2023-11-04: 100 mg via INTRAVENOUS

## 2023-11-04 MED ORDER — LIDOCAINE 2% (20 MG/ML) 5 ML SYRINGE
INTRAMUSCULAR | Status: AC
  Filled 2023-11-04: qty 5

## 2023-11-04 MED ORDER — OXYCODONE HCL 5 MG PO TABS: 5.0000 mg | ORAL_TABLET | Freq: Four times a day (QID) | ORAL | 0 refills | Status: DC | PRN

## 2023-11-04 MED ORDER — LIDOCAINE 2% (20 MG/ML) 5 ML SYRINGE
INTRAMUSCULAR | Status: DC | PRN
  Administered 2023-11-04: 60 mg via INTRAVENOUS

## 2023-11-04 MED ORDER — ONDANSETRON HCL 4 MG/2ML IJ SOLN
INTRAMUSCULAR | Status: AC
  Filled 2023-11-04: qty 2

## 2023-11-04 MED ORDER — MIDAZOLAM HCL 2 MG/2ML IJ SOLN
INTRAMUSCULAR | Status: AC
  Filled 2023-11-04: qty 2

## 2023-11-04 MED ORDER — PROPOFOL 10 MG/ML IV BOLUS
INTRAVENOUS | Status: DC | PRN
  Administered 2023-11-04: 200 mg via INTRAVENOUS

## 2023-11-04 MED ORDER — ROCURONIUM BROMIDE 10 MG/ML (PF) SYRINGE
PREFILLED_SYRINGE | INTRAVENOUS | Status: AC
  Filled 2023-11-04: qty 10

## 2023-11-04 SURGICAL SUPPLY — 39 items
BLADE CLIPPER SURG (BLADE) IMPLANT
BLADE SURG 15 STRL LF DISP TIS (BLADE) ×1 IMPLANT
CANISTER SUCT 1200ML W/VALVE (MISCELLANEOUS) IMPLANT
CHLORAPREP W/TINT 26 (MISCELLANEOUS) ×1 IMPLANT
COVER BACK TABLE 60X90IN (DRAPES) ×1 IMPLANT
COVER MAYO STAND STRL (DRAPES) ×1 IMPLANT
DERMABOND ADVANCED .7 DNX12 (GAUZE/BANDAGES/DRESSINGS) ×2 IMPLANT
DRAPE LAPAROTOMY 100X72 PEDS (DRAPES) ×1 IMPLANT
DRAPE UTILITY XL STRL (DRAPES) ×1 IMPLANT
DRSG TEGADERM 2-3/8X2-3/4 SM (GAUZE/BANDAGES/DRESSINGS) IMPLANT
ELECT REM PT RETURN 9FT ADLT (ELECTROSURGICAL) ×1
ELECTRODE REM PT RTRN 9FT ADLT (ELECTROSURGICAL) ×1 IMPLANT
GLOVE BIOGEL PI IND STRL 7.0 (GLOVE) IMPLANT
GLOVE SURG SIGNA 7.5 PF LTX (GLOVE) ×1 IMPLANT
GLOVE SURG SS PI 6.5 STRL IVOR (GLOVE) IMPLANT
GOWN STRL REUS W/ TWL LRG LVL3 (GOWN DISPOSABLE) ×1 IMPLANT
GOWN STRL REUS W/ TWL XL LVL3 (GOWN DISPOSABLE) ×1 IMPLANT
MESH VENTRALEX ST 1-7/10 CRC S (Mesh General) IMPLANT
MESH VENTRALEX ST 2.5 CRC MED (Mesh General) IMPLANT
MESH VENTRALEX ST 8CM LRG (Mesh General) IMPLANT
NDL HYPO 25X1 1.5 SAFETY (NEEDLE) ×1 IMPLANT
NEEDLE HYPO 25X1 1.5 SAFETY (NEEDLE) ×1
NS IRRIG 1000ML POUR BTL (IV SOLUTION) IMPLANT
PACK BASIN DAY SURGERY FS (CUSTOM PROCEDURE TRAY) ×1 IMPLANT
PENCIL SMOKE EVACUATOR (MISCELLANEOUS) ×1 IMPLANT
SLEEVE SCD COMPRESS KNEE MED (STOCKING) ×1 IMPLANT
SPIKE FLUID TRANSFER (MISCELLANEOUS) IMPLANT
SPONGE T-LAP 4X18 ~~LOC~~+RFID (SPONGE) ×1 IMPLANT
SUT MNCRL AB 4-0 PS2 18 (SUTURE) ×1 IMPLANT
SUT NOVA 0 T19/GS 22DT (SUTURE) IMPLANT
SUT NOVA NAB DX-16 0-1 5-0 T12 (SUTURE) IMPLANT
SUT NOVA NAB GS-21 1 T12 (SUTURE) IMPLANT
SUT VIC AB 2-0 SH 27XBRD (SUTURE) IMPLANT
SUT VIC AB 3-0 SH 27X BRD (SUTURE) ×2 IMPLANT
SYR 10ML LL (SYRINGE) IMPLANT
SYR CONTROL 10ML LL (SYRINGE) ×1 IMPLANT
TOWEL GREEN STERILE FF (TOWEL DISPOSABLE) ×1 IMPLANT
TUBE CONNECTING 20X1/4 (TUBING) IMPLANT
YANKAUER SUCT BULB TIP NO VENT (SUCTIONS) IMPLANT

## 2023-11-04 NOTE — Anesthesia Preprocedure Evaluation (Signed)
 Anesthesia Evaluation  Patient identified by MRN, date of birth, ID band Patient awake    Reviewed: Allergy & Precautions, NPO status , Patient's Chart, lab work & pertinent test results  Airway Mallampati: II  TM Distance: >3 FB Neck ROM: Full    Dental   Pulmonary neg pulmonary ROS   Pulmonary exam normal        Cardiovascular hypertension, Pt. on medications Normal cardiovascular exam     Neuro/Psych negative neurological ROS     GI/Hepatic Neg liver ROS,GERD  ,,  Endo/Other  negative endocrine ROS    Renal/GU negative Renal ROS     Musculoskeletal  (+) Arthritis ,    Abdominal   Peds  Hematology negative hematology ROS (+)   Anesthesia Other Findings   Reproductive/Obstetrics                             Anesthesia Physical Anesthesia Plan  ASA: 2  Anesthesia Plan: General   Post-op Pain Management: Tylenol  PO (pre-op)* and Toradol  IV (intra-op)*   Induction: Intravenous  PONV Risk Score and Plan: 2 and Dexamethasone , Ondansetron  and Treatment may vary due to age or medical condition  Airway Management Planned: Oral ETT  Additional Equipment:   Intra-op Plan:   Post-operative Plan: Extubation in OR  Informed Consent: I have reviewed the patients History and Physical, chart, labs and discussed the procedure including the risks, benefits and alternatives for the proposed anesthesia with the patient or authorized representative who has indicated his/her understanding and acceptance.     Dental advisory given  Plan Discussed with: CRNA  Anesthesia Plan Comments:        Anesthesia Quick Evaluation

## 2023-11-04 NOTE — Transfer of Care (Signed)
 Immediate Anesthesia Transfer of Care Note  Patient: Walter Edwards  Procedure(s) Performed: OPEN INCISIONAL HERNIA REPAIR WITH MESH (Abdomen)  Patient Location: PACU  Anesthesia Type:General  Level of Consciousness: drowsy  Airway & Oxygen Therapy: Patient Spontanous Breathing and Patient connected to face mask oxygen  Post-op Assessment: Report given to RN and Post -op Vital signs reviewed and stable  Post vital signs: Reviewed and stable  Last Vitals:  Vitals Value Taken Time  BP 153/98 11/04/23 1310  Temp    Pulse 86 11/04/23 1311  Resp 13 11/04/23 1311  SpO2 98 % 11/04/23 1311  Vitals shown include unfiled device data.  Last Pain:  Vitals:   11/04/23 1013  TempSrc: Oral  PainSc: 2       Patients Stated Pain Goal: 5 (11/04/23 1013)  Complications: No notable events documented.

## 2023-11-04 NOTE — Anesthesia Procedure Notes (Signed)
 Procedure Name: Intubation Date/Time: 11/04/2023 12:14 PM  Performed by: Maudine Grayce ORN, CRNAPre-anesthesia Checklist: Patient identified, Emergency Drugs available, Suction available and Patient being monitored Patient Re-evaluated:Patient Re-evaluated prior to induction Oxygen Delivery Method: Circle System Utilized Preoxygenation: Pre-oxygenation with 100% oxygen Induction Type: IV induction Ventilation: Mask ventilation without difficulty Laryngoscope Size: Mac and 4 Grade View: Grade II Tube type: Oral Tube size: 7.5 mm Number of attempts: 1 Airway Equipment and Method: Stylet and Oral airway Placement Confirmation: ETT inserted through vocal cords under direct vision, positive ETCO2 and breath sounds checked- equal and bilateral Tube secured with: Tape Dental Injury: Teeth and Oropharynx as per pre-operative assessment

## 2023-11-04 NOTE — Op Note (Signed)
 OPEN INCISIONAL HERNIA REPAIR WITH MESH  Procedure Note  Walter Edwards 11/04/2023   Pre-op Diagnosis: Incisional hernia (5 cm fascial defect)     Post-op Diagnosis: Incisional hernia (3 cm fascial defect)  Procedure(s): OPEN INCISIONAL HERNIA REPAIR WITH MESH 3 CM FASCIAL DEFECT) 8 CM ROUND VENTRAL PROLENE MESH FROM BARD  Surgeon(s): Vernetta Berg, MD  Anesthesia: General  Staff:  Circulator: Elaine Avelina PARAS, RN; Trudy Aundra LABOR, RN Scrub Person: Milford Asberry CROME  Estimated Blood Loss: Minimal               Findings: The patient was found to have a 3 cm fascial defect at the site of his previous primary repair of a ventral hernia.  It was repaired with an 8 cm round ventral Prolene ST patch from Bard  Procedure: The patient was brought to the operating identifies the correct patient.  He was placed upon the operating table and general anesthesia was induced.  His abdomen was prepped and draped in usual sterile fashion.  Upon induction of anesthesia contents in the hernia sac reduced spontaneously.  I excised the patient's previous scar in the upper mid abdomen with a scalpel and then lengthen the incision superior lesion inferiorly.  I then dissected down to the fascial defect.  There was a very thin hernia sac which was excised.  All contents had already been reduced back into the abdominal cavity.  I was able to palpate down the midline fascia toward the umbilicus and found no hernia in that direction or spirits over the xiphoid.  The falciform ligament was located right to the right edge of the fascial defect.  I took this down with the electrocautery in order to facilitate placement of the mesh.  I measured the fascial defect and was 3 cm in all directions.  I brought an 8 cm round ventral Prolene patch from Bard onto the field.  I placed it through the fascial opening and then pulled it up against the peritoneum with the ties.  I then sutured the mesh in place  circumferentially with #1 Novafil sutures.  I then cut the ties and closed the fascia over the top of the mesh with figure-of-eight #1 Novafil sutures.  Wide coverage of the fascial defect and then good closure of the fascia.  To be achieved.  I anesthetized the fascia and surrounding skin with Marcaine .  Hemostasis peer to be achieved.  I then closed subcutaneous tissue with interrupted 3-0 Vicryl sutures and closed the skin with a running 4-0 Monocryl.  Dermabond was then applied.  The patient tolerated the seizure well.  All the counts were correct at the end of the procedure.  The patient was then extubated in the operating room and taken in a stable condition to the recovery room.          Berg Vernetta   Date: 11/04/2023  Time: 12:57 PM

## 2023-11-04 NOTE — Discharge Instructions (Addendum)
 [facility]__[facility]  UMBILICAL OR INGUINAL HERNIA REPAIR: POST OP INSTRUCTIONS  Always review your discharge instruction sheet given to you by the facility where your surgery was performed. IF YOU HAVE DISABILITY OR FAMILY LEAVE FORMS, YOU MUST BRING THEM TO THE OFFICE FOR PROCESSING.   DO NOT GIVE THEM TO YOUR DOCTOR.  1. A  prescription for pain medication may be given to you upon discharge.  Take your pain medication as prescribed, if needed.  If narcotic pain medicine is not needed, then you may take acetaminophen  (Tylenol ) or ibuprofen (Advil) as needed. 2. Take your usually prescribed medications unless otherwise directed. If you need a refill on your pain medication, please contact your pharmacy.  They will contact our office to request authorization. Prescriptions will not be filled after 5 pm or on week-ends. 3. You should follow a light diet the first 24 hours after arrival home, such as soup and crackers, etc.  Be sure to include lots of fluids daily.  Resume your normal diet the day after surgery. 4.Most patients will experience some swelling and bruising around the umbilicus or in the groin and scrotum.  Ice packs and reclining will help.  Swelling and bruising can take several days to resolve.  6. It is common to experience some constipation if taking pain medication after surgery.  Increasing fluid intake and taking a stool softener (such as Colace) will usually help or prevent this problem from occurring.  A mild laxative (Milk of Magnesia or Miralax) should be taken according to package directions if there are no bowel movements after 48 hours. 7. Unless discharge instructions indicate otherwise, you may remove your bandages 24-48 hours after surgery, and you may shower at that time.  You may have steri-strips (small skin tapes) in place directly over the incision.  These strips should be left on the skin for 7-10 days.  If your surgeon used skin glue on the  incision, you may shower in 24 hours.  The glue will flake off over the next 2-3 weeks.  Any sutures or staples will be removed at the office during your follow-up visit. 8. ACTIVITIES:  You may resume regular (light) daily activities beginning the next day--such as daily self-care, walking, climbing stairs--gradually increasing activities as tolerated.  You may have sexual intercourse when it is comfortable.  Refrain from any heavy lifting or straining until approved by your doctor.  a.You may drive when you are no longer taking prescription pain medication, you can comfortably wear a seatbelt, and you can safely maneuver your car and apply brakes. b.RETURN TO WORK:   _____________________________________________  9.You should see your doctor in the office for a follow-up appointment approximately 2-3 weeks after your surgery.  Make sure that you call for this appointment within a day or two after you arrive home to insure a convenient appointment time. 10.OTHER INSTRUCTIONS: YOU MAY SHOWER STARTING TOMORROW ICE PACK, EXTRA STRENGTH TYLENOL , AND IBUPROFEN ALSO FOR PAIN NO LIFTING MORE THAN 15 POUNDS FOR 6 WEEKS    _____________________________________  WHEN TO CALL YOUR DOCTOR: Fever over 101.0 Inability to urinate Nausea and/or vomiting Extreme swelling or bruising Continued bleeding from incision. Increased pain, redness, or drainage from the incision  The clinic staff is available to answer your questions during regular business hours.  Please don't hesitate to call and ask to speak to one of the nurses for clinical concerns.  If you have a medical emergency, go to the nearest emergency room or call  911.  A surgeon from [facility] is always on call at the hospital   [address], [place], [place]  [address] ?  [address] [address], [place], [place]   [address] [phone] ? [phone] ? FAX [phone] Web site: [email or web address]   Post  Anesthesia Home Care Instructions  Activity: Get plenty of rest for the remainder of the day. A responsible individual must stay with you for 24 hours following the procedure.  For the next 24 hours, DO NOT: -Drive a car -Advertising copywriter -Drink alcoholic beverages -Take any medication unless instructed by your physician -Make any legal decisions or sign important papers.  Meals: Start with liquid foods such as gelatin or soup. Progress to regular foods as tolerated. Avoid greasy, spicy, heavy foods. If nausea and/or vomiting occur, drink only clear liquids until the nausea and/or vomiting subsides. Call your physician if vomiting continues.  Special Instructions/Symptoms: Your throat may feel dry or sore from the anesthesia or the breathing tube placed in your throat during surgery. If this causes discomfort, gargle with warm salt water. The discomfort should disappear within 24 hours.  May have Tylenol  after 6:20pm if needed.

## 2023-11-04 NOTE — Interval H&P Note (Signed)
 History and Physical Interval Note:no change in H and P  11/04/2023 10:35 AM  Walter Edwards  has presented today for surgery, with the diagnosis of Incarcerated Inguinal Hernia.  The various methods of treatment have been discussed with the patient and family. After consideration of risks, benefits and other options for treatment, the patient has consented to  Procedure(s): OPEN INCISIONAL HERNIA REPAIR WITH MESH (N/A) as a surgical intervention.  The patient's history has been reviewed, patient examined, no change in status, stable for surgery.  I have reviewed the patient's chart and labs.  Questions were answered to the patient's satisfaction.     Vicenta Poli

## 2023-11-05 ENCOUNTER — Encounter (HOSPITAL_BASED_OUTPATIENT_CLINIC_OR_DEPARTMENT_OTHER): Payer: Self-pay | Admitting: Surgery

## 2023-11-05 NOTE — Anesthesia Postprocedure Evaluation (Signed)
 Anesthesia Post Note  Patient: Walter Edwards  Procedure(s) Performed: OPEN INCISIONAL HERNIA REPAIR WITH MESH (Abdomen)     Patient location during evaluation: PACU Anesthesia Type: General Level of consciousness: awake and alert Pain management: pain level controlled Vital Signs Assessment: post-procedure vital signs reviewed and stable Respiratory status: spontaneous breathing, nonlabored ventilation, respiratory function stable and patient connected to nasal cannula oxygen Cardiovascular status: blood pressure returned to baseline and stable Postop Assessment: no apparent nausea or vomiting Anesthetic complications: no   No notable events documented.  Last Vitals:  Vitals:   11/04/23 1400 11/04/23 1424  BP: 122/79 (!) 130/92  Pulse: 86 86  Resp: 16 16  Temp:  36.6 C  SpO2: 91% 91%    Last Pain:  Vitals:   11/04/23 1424  TempSrc: Temporal  PainSc: 4                  Epifanio Lamar FORBES

## 2023-11-17 DIAGNOSIS — R972 Elevated prostate specific antigen [PSA]: Secondary | ICD-10-CM | POA: Diagnosis not present

## 2023-11-17 DIAGNOSIS — K760 Fatty (change of) liver, not elsewhere classified: Secondary | ICD-10-CM | POA: Diagnosis not present

## 2023-11-17 DIAGNOSIS — M5440 Lumbago with sciatica, unspecified side: Secondary | ICD-10-CM | POA: Diagnosis not present

## 2023-11-17 DIAGNOSIS — K219 Gastro-esophageal reflux disease without esophagitis: Secondary | ICD-10-CM | POA: Diagnosis not present

## 2023-11-17 DIAGNOSIS — Z79899 Other long term (current) drug therapy: Secondary | ICD-10-CM | POA: Diagnosis not present

## 2023-11-17 DIAGNOSIS — I10 Essential (primary) hypertension: Secondary | ICD-10-CM | POA: Diagnosis not present

## 2023-11-17 DIAGNOSIS — E7801 Familial hypercholesterolemia: Secondary | ICD-10-CM | POA: Diagnosis not present

## 2023-11-17 DIAGNOSIS — Z6833 Body mass index (BMI) 33.0-33.9, adult: Secondary | ICD-10-CM | POA: Diagnosis not present

## 2023-11-17 DIAGNOSIS — G8929 Other chronic pain: Secondary | ICD-10-CM | POA: Diagnosis not present

## 2023-11-17 DIAGNOSIS — E6609 Other obesity due to excess calories: Secondary | ICD-10-CM | POA: Diagnosis not present

## 2023-11-17 DIAGNOSIS — E66811 Obesity, class 1: Secondary | ICD-10-CM | POA: Diagnosis not present

## 2023-11-19 DIAGNOSIS — J301 Allergic rhinitis due to pollen: Secondary | ICD-10-CM | POA: Diagnosis not present

## 2023-11-26 DIAGNOSIS — J301 Allergic rhinitis due to pollen: Secondary | ICD-10-CM | POA: Diagnosis not present

## 2023-11-28 DIAGNOSIS — K430 Incisional hernia with obstruction, without gangrene: Secondary | ICD-10-CM | POA: Diagnosis not present

## 2023-11-28 DIAGNOSIS — Z09 Encounter for follow-up examination after completed treatment for conditions other than malignant neoplasm: Secondary | ICD-10-CM | POA: Diagnosis not present

## 2023-12-01 ENCOUNTER — Other Ambulatory Visit: Payer: Self-pay | Admitting: Internal Medicine

## 2023-12-01 DIAGNOSIS — I10 Essential (primary) hypertension: Secondary | ICD-10-CM | POA: Diagnosis not present

## 2023-12-01 DIAGNOSIS — Z79899 Other long term (current) drug therapy: Secondary | ICD-10-CM | POA: Diagnosis not present

## 2023-12-01 DIAGNOSIS — R972 Elevated prostate specific antigen [PSA]: Secondary | ICD-10-CM | POA: Diagnosis not present

## 2023-12-01 DIAGNOSIS — E7801 Familial hypercholesterolemia: Secondary | ICD-10-CM | POA: Diagnosis not present

## 2023-12-03 DIAGNOSIS — J301 Allergic rhinitis due to pollen: Secondary | ICD-10-CM | POA: Diagnosis not present

## 2023-12-10 ENCOUNTER — Ambulatory Visit: Payer: 59 | Admitting: Internal Medicine

## 2023-12-10 DIAGNOSIS — J301 Allergic rhinitis due to pollen: Secondary | ICD-10-CM | POA: Diagnosis not present

## 2023-12-16 DIAGNOSIS — J301 Allergic rhinitis due to pollen: Secondary | ICD-10-CM | POA: Diagnosis not present

## 2023-12-16 DIAGNOSIS — K430 Incisional hernia with obstruction, without gangrene: Secondary | ICD-10-CM | POA: Diagnosis not present

## 2023-12-16 DIAGNOSIS — Z09 Encounter for follow-up examination after completed treatment for conditions other than malignant neoplasm: Secondary | ICD-10-CM | POA: Diagnosis not present

## 2023-12-17 DIAGNOSIS — J301 Allergic rhinitis due to pollen: Secondary | ICD-10-CM | POA: Diagnosis not present

## 2023-12-24 DIAGNOSIS — J301 Allergic rhinitis due to pollen: Secondary | ICD-10-CM | POA: Diagnosis not present

## 2023-12-29 ENCOUNTER — Encounter: Payer: Self-pay | Admitting: Orthopedic Surgery

## 2023-12-31 DIAGNOSIS — J301 Allergic rhinitis due to pollen: Secondary | ICD-10-CM | POA: Diagnosis not present

## 2024-01-07 DIAGNOSIS — J301 Allergic rhinitis due to pollen: Secondary | ICD-10-CM | POA: Diagnosis not present

## 2024-01-12 DIAGNOSIS — K219 Gastro-esophageal reflux disease without esophagitis: Secondary | ICD-10-CM | POA: Diagnosis not present

## 2024-01-12 DIAGNOSIS — R053 Chronic cough: Secondary | ICD-10-CM | POA: Diagnosis not present

## 2024-01-12 DIAGNOSIS — J301 Allergic rhinitis due to pollen: Secondary | ICD-10-CM | POA: Diagnosis not present

## 2024-01-14 DIAGNOSIS — J301 Allergic rhinitis due to pollen: Secondary | ICD-10-CM | POA: Diagnosis not present

## 2024-01-20 ENCOUNTER — Encounter: Payer: Self-pay | Admitting: Internal Medicine

## 2024-01-20 ENCOUNTER — Ambulatory Visit (INDEPENDENT_AMBULATORY_CARE_PROVIDER_SITE_OTHER): Payer: 59 | Admitting: Internal Medicine

## 2024-01-20 VITALS — BP 122/80 | HR 76 | Ht 70.0 in | Wt 231.0 lb

## 2024-01-20 DIAGNOSIS — K219 Gastro-esophageal reflux disease without esophagitis: Secondary | ICD-10-CM

## 2024-01-20 DIAGNOSIS — K5909 Other constipation: Secondary | ICD-10-CM

## 2024-01-20 DIAGNOSIS — R1011 Right upper quadrant pain: Secondary | ICD-10-CM

## 2024-01-20 NOTE — Patient Instructions (Signed)
 Continue Amitiza 24 mcg twice daily.   _______________________________________________________  If your blood pressure at your visit was 140/90 or greater, please contact your primary care physician to follow up on this.  _______________________________________________________  If you are age 53 or older, your body mass index should be between 23-30. Your Body mass index is 33.15 kg/m. If this is out of the aforementioned range listed, please consider follow up with your Primary Care Provider.  If you are age 66 or younger, your body mass index should be between 19-25. Your Body mass index is 33.15 kg/m. If this is out of the aformentioned range listed, please consider follow up with your Primary Care Provider.   ________________________________________________________  The [facility] providers would like to encourage you to use [facility] to communicate with providers for non-urgent requests or questions.  Due to long hold times on the telephone, sending your provider a message by [facility] may be a faster and more efficient way to get a response.  Please allow 48 business hours for a response.  Please remember that this is for non-urgent requests.  _______________________________________________________

## 2024-01-21 ENCOUNTER — Encounter: Payer: Self-pay | Admitting: Internal Medicine

## 2024-01-21 DIAGNOSIS — J301 Allergic rhinitis due to pollen: Secondary | ICD-10-CM | POA: Diagnosis not present

## 2024-01-21 NOTE — Progress Notes (Signed)
 Subjective:    Patient ID: Walter Edwards, male    DOB: 09/13/1971, 53 y.o.   MRN: [id number]  HPI Walter Edwards is a 53 year old male with a history of chronic right upper quadrant pain, chronic constipation, previous cholecystectomy for gallbladder disease, history of umbilical and inguinal hernias, history of GERD, multilevel degenerative disc disease, who is here for follow-up.   He has ongoing chronic right upper quadrant pain, which he believes now it is related to his back. He has a history of multiple surgeries, including three lower back surgeries and a recent open incisional hernia repair with mesh on November 04, 2023. Despite these interventions, he continues to experience pain, which he believes is related to his back. He wears a back brace, which alleviates most of the pain, allowing him to perform activities such as walking and yard work for up to two hours before the pain returns. The pain diminishes when he sits or lies down, suggesting a positional component.  He has a history of chronic constipation and is currently managing it with Amitiza (lubiprostone) 24 mcg twice daily, which he takes after lunch and dinner. He supplements this regimen with over-the-counter laxatives like Dulcolax as needed, particularly if he experiences constipation. This regimen has been effective in reducing constipation, although there is variability in bowel movements depending on his diet and activity level. He occasionally uses MiraLAX if he goes a couple of days without a bowel movement. Severe pain can trigger an urgent need for a bowel movement, which he finds perplexing.  He has undergone extensive imaging and a colonoscopy on April 16, 2019, which showed non-bleeding internal hemorrhoids but was otherwise normal. Despite these findings, he continues to experience symptoms that he attributes to referred pain from his back.  He has a second opinion on this back pain at [facility] which is upcoming.  Review of  Systems As per HPI, otherwise negative  Current Medications, Allergies, Past Medical History, Past Surgical History, Family History and Social History were reviewed in [employer] record.    Objective:   Physical Exam BP 122/80   Pulse 76   Ht 5\' 10"  (1.778 m)   Wt 231 lb (104.8 kg)   BMI 33.15 kg/m  Gen: awake, alert, NAD HEENT: anicteric  Neuro: nonfocal  DIAGNOSTIC Colonoscopy: Non-bleeding internal hemorrhoids, otherwise normal to the cecum with good bowel preparation (04/16/2019)  MRI abd reviewed    Assessment & Plan:   Chronic right upper quadrant pain Chronic right upper quadrant pain persists despite previous surgical interventions, including hernia repair. The pain is suspected to be referred from the thoracic spine, as it is alleviated by changes in posture and the use of a back brace. Imaging and colonoscopy have not revealed any gastrointestinal causes. The pain is exacerbated by physical activity and relieved by rest, suggesting a structural issue in the spine. A second opinion at [facility] is planned to further evaluate potential spinal causes of pain. - Continue lubiprostone (Amitiza) 24 mcg twice daily. - Encourage use of back brace for pain relief and functional improvement. - Follow up with a second opinion at [facility] for further evaluation of potential spinal causes of pain.  Chronic constipation Chronic constipation is managed with lubiprostone (Amitiza) and over-the-counter laxatives as needed. The regimen is effective in maintaining regular bowel movements, although dietary factors and activity levels can influence bowel habits. He uses MiraLAX as needed if constipation persists for several days. - Continue lubiprostone (Amitiza) 24 mcg twice daily. -  Use over-the-counter laxatives such as Dulcolax as needed based on bowel movement frequency and consistency. - Use MiraLAX as needed if constipation persists for several days.  Gastroesophageal  reflux disease (GERD) Well-controlled on pantoprazole 40 mg daily  Colon cancer screening --10-year interval after a normal colonoscopy in June 2020 -Screening recommended June 2030  30 minutes total spent today including patient facing time, coordination of care, reviewing medical history/procedures/pertinent radiology studies, and documentation of the encounter.

## 2024-01-28 DIAGNOSIS — J301 Allergic rhinitis due to pollen: Secondary | ICD-10-CM | POA: Diagnosis not present

## 2024-02-02 DIAGNOSIS — R1310 Dysphagia, unspecified: Secondary | ICD-10-CM | POA: Diagnosis not present

## 2024-02-02 DIAGNOSIS — K219 Gastro-esophageal reflux disease without esophagitis: Secondary | ICD-10-CM | POA: Diagnosis not present

## 2024-02-02 DIAGNOSIS — J301 Allergic rhinitis due to pollen: Secondary | ICD-10-CM | POA: Diagnosis not present

## 2024-02-02 DIAGNOSIS — R053 Chronic cough: Secondary | ICD-10-CM | POA: Diagnosis not present

## 2024-02-04 DIAGNOSIS — J301 Allergic rhinitis due to pollen: Secondary | ICD-10-CM | POA: Diagnosis not present

## 2024-02-09 ENCOUNTER — Other Ambulatory Visit: Payer: Self-pay | Admitting: Internal Medicine

## 2024-02-09 DIAGNOSIS — K219 Gastro-esophageal reflux disease without esophagitis: Secondary | ICD-10-CM

## 2024-02-09 DIAGNOSIS — R1310 Dysphagia, unspecified: Secondary | ICD-10-CM

## 2024-02-11 DIAGNOSIS — J301 Allergic rhinitis due to pollen: Secondary | ICD-10-CM | POA: Diagnosis not present

## 2024-02-12 ENCOUNTER — Ambulatory Visit
Admission: RE | Admit: 2024-02-12 | Discharge: 2024-02-12 | Disposition: A | Discharge status: Home / Self Care | Source: Ambulatory Visit | Attending: Internal Medicine | Admitting: Internal Medicine

## 2024-02-12 DIAGNOSIS — R1310 Dysphagia, unspecified: Secondary | ICD-10-CM | POA: Insufficient documentation

## 2024-02-12 DIAGNOSIS — K219 Gastro-esophageal reflux disease without esophagitis: Secondary | ICD-10-CM | POA: Diagnosis not present

## 2024-02-18 DIAGNOSIS — J301 Allergic rhinitis due to pollen: Secondary | ICD-10-CM | POA: Diagnosis not present

## 2024-02-25 DIAGNOSIS — J301 Allergic rhinitis due to pollen: Secondary | ICD-10-CM | POA: Diagnosis not present

## 2024-03-01 DIAGNOSIS — Z125 Encounter for screening for malignant neoplasm of prostate: Secondary | ICD-10-CM | POA: Diagnosis not present

## 2024-03-03 ENCOUNTER — Ambulatory Visit (INDEPENDENT_AMBULATORY_CARE_PROVIDER_SITE_OTHER): Admitting: Nurse Practitioner

## 2024-03-03 ENCOUNTER — Encounter: Payer: Self-pay | Admitting: Nurse Practitioner

## 2024-03-03 VITALS — BP 100/80 | HR 84 | Ht 69.5 in | Wt 229.1 lb

## 2024-03-03 DIAGNOSIS — K5909 Other constipation: Secondary | ICD-10-CM | POA: Diagnosis not present

## 2024-03-03 DIAGNOSIS — R1310 Dysphagia, unspecified: Secondary | ICD-10-CM | POA: Insufficient documentation

## 2024-03-03 DIAGNOSIS — K219 Gastro-esophageal reflux disease without esophagitis: Secondary | ICD-10-CM

## 2024-03-03 DIAGNOSIS — R059 Cough, unspecified: Secondary | ICD-10-CM

## 2024-03-03 DIAGNOSIS — J301 Allergic rhinitis due to pollen: Secondary | ICD-10-CM | POA: Diagnosis not present

## 2024-03-03 NOTE — Progress Notes (Signed)
 Yeah    03/03/2024 Walter Edwards [id number] [birth year]   Chief Complaint: Difficulty swallowing   History of Present Illness: Walter Edwards is a 53 year old male with a history of chronic RUQ pain, GERD, multilevel degenerative disc disease, umbilical and inguinal inguinal hernias s/p incisional hernia repair 11/02/2023.  Past cholecystectomy.   He was last seen in office by Dr. Bridgett Edwards 01/20/2024 due to having chronic RUQ pain which radiates to his back. His RUQ pain was possibly related to his back issues, he has undergone multiple lower back surgeries in the past. He was advised to follow-up pursue a second opinion at [facility] for further evaluation regarding potential spine causes of pain. His chronic constipation was fairly well-controlled on Amitiza  24 mcg daily as well as MiraLAX and Dulcolax as needed. His most recent colonoscopy 04/16/2019 showed internal hemorrhoids otherwise was normal. GERD symptoms were stable on Pantoprazole  40 mg once daily.  He presents today with complaints of dysphagia. He initially thought his symptoms were secondary to seasonal allergies with frequent phlegm resulting in persistent coughing and throat clearing. He describes having difficulty swallowing dry foods such such as bread, meat for the past year and recently developed difficulty swallowing pills. When food gets stuck in the throat/upper esophagus he drinks water and the food passe down. One day recently, he was cooking at home and ate one piece of meat which he chewed a lot, he started coughing and the meat became stuck in his throat/upper esophagus, he felt like he was being strangled and eventually coughed up he meat. He was seen by his allergist and a laryngoscopy was completed which the patient reported was normal and he was advised to follow-up with his PCP.  He was seen by his PCP who assessed his symptoms were likely acid reflux related and was advised to follow-up in our office.  His PCP increased  Pantoprazole  40 mg once daily to twice daily and added Famotidine 40 mg at bedtime 1 and half weeks ago. His cough has slightly improved but has not abated. He takes Nasacort  2 sprays intranasally once daily. 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 His most recent EGD was 09/24/2022 which showed a normal esophagus and mild gastritis, biopsies were negative for H. pylori.  Labs 12/01/2023: WBC 5.9.  Hemoglobin 15.5.  Hematocrit 44.9.  Platelet 260.  Glucose 114.  Sodium 144.  Potassium 4.4.  BUN 11.  Creatinine 1.0.  Total bili 0.4.  Alk phos 96.  AST 17.  ALT 25.   Barium Swallow Study 02/12/2024: FINDINGS: Swallowing: Appears normal. No vestibular penetration or aspiration seen.   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 13 mm barium tablet passed easily beyond this point.   Esophagus: Normal appearance.  No mass or significant stricture.   Esophageal motility: Within normal limits.   Hiatal Hernia: No significant hiatal hernia.   Gastroesophageal reflux: No spontaneous gastroesophageal reflux.   Ingested 13 mm barium tablet: Passed normally   Other: None.   IMPRESSION: 1. 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 13 mm barium tablet passed easily beyond this point.   EGD 09/24/2022: - Normal esophagus. Biopsied.  - Mild diffuse gastritis. Biopsied.  - Normal examined duodenum. Biopsied.  Past Medical History:  Diagnosis Date   Allergy    Chronic back pain    Gastritis  GERD (gastroesophageal reflux disease)    History of kidney stones    Hyperlipidemia    Hypertension    Inguinal hernia    Internal hemorrhoids    Low HDL (under 40)    Mild depression    Otitis media    Seasonal rhinitis    Past Surgical History:  Procedure  Laterality Date   BACK SURGERY     x3   CHOLECYSTECTOMY  06/02/2020   CHOLECYSTECTOMY N/A 06/02/2020   Procedure: LAPAROSCOPIC CHOLECYSTECTOMY;  Surgeon: Walter Blumenthal, MD;  Location: [facility] OR;  Service: General;  Laterality: N/A;   COLONOSCOPY     ENDOSCOPIC PLANTAR FASCIOTOMY  05/23/2020   INCISIONAL HERNIA REPAIR N/A 11/04/2023   Procedure: OPEN INCISIONAL HERNIA REPAIR WITH MESH;  Surgeon: Walter Blumenthal, MD;  Location: [facility];  Service: General;  Laterality: N/A;   VENTRAL HERNIA REPAIR N/A 07/17/2021   Procedure: VENTRA HERNIA REPAIR;  Surgeon: Walter Blumenthal, MD;  Location: [facility];  Service: General;  Laterality: N/A;   Current Outpatient Medications on File Prior to Visit  Medication Sig Dispense Refill   amLODipine  (NORVASC ) 10 MG tablet Take 1 tablet (10 mg total) by mouth daily. 90 tablet 3   azelastine (ASTELIN) 0.1 % nasal spray Place into both nostrils as needed.     carbamazepine (TEGRETOL) 200 MG tablet Take 200 mg by mouth daily.     clotrimazole -betamethasone (LOTRISONE) cream Apply 1 Application topically as needed.     cyclobenzaprine (FLEXERIL) 10 MG tablet Take 10 mg by mouth daily as needed (Back pain).      diphenhydrAMINE (BENADRYL) 25 MG tablet Take 25 mg by mouth See admin instructions. Takes with hydrocodone to prevent itching as needed     docusate sodium (COLACE) 250 MG capsule Take 500 mg by mouth daily after lunch.     DULoxetine (CYMBALTA) 30 MG capsule Take 30 mg by mouth daily.     famotidine (PEPCID) 40 MG tablet Take 1 tablet by mouth at bedtime.     fexofenadine  (ALLEGRA ) 180 MG tablet Take 1 tablet (180 mg total) by mouth daily. 90 tablet 3   HYDROcodone-acetaminophen  (NORCO) 10-325 MG tablet Take by mouth as needed.     ibuprofen (ADVIL) 800 MG tablet Take 1 tablet by mouth 3 (three) times daily as needed.     lubiprostone  (AMITIZA ) 24 MCG capsule TAKE ONE CAPSULE BY MOUTH TWICE DAILY WITH A MEAL 180 capsule  1   montelukast  (SINGULAIR ) 10 MG tablet TAKE 1 TABLET BY MOUTH EVERY MORNING FORALLERGIES 90 tablet 3   Multiple Vitamin (MULTIVITAMIN) tablet Take 1 tablet by mouth daily.     naloxegol  oxalate (MOVANTIK ) 25 MG TABS tablet Take 1 tablet (25 mg total) by mouth daily. 30 tablet 5   pantoprazole  (PROTONIX ) 40 MG tablet TAKE 1 TABLET BY MOUTH ONCE A DAY 90 tablet 3   polyethylene glycol powder (GLYCOLAX/MIRALAX) 17 GM/SCOOP powder Take 1 Scoop by mouth as needed. Clear lax     Sennosides-Docusate Sodium (EQ STOOL SOFTENER/LAXATIVE PO) Take 1 tablet by mouth daily.     tamsulosin (FLOMAX) 0.4 MG CAPS capsule Take 0.4 mg by mouth daily.     triamcinolone  (NASACORT ) 55 MCG/ACT AERO nasal inhaler Place 2 sprays into the nose daily. 16.5 g 5   Vit C-Quercet-Bioflv-Bromelain 450-250-125-50 MG CAPS Take 2 Doses by mouth 3 (three) times daily between meals. 42 capsule 0   EPINEPHrine  0.3 mg/0.3 mL IJ SOAJ injection Inject 0.3 mg into the muscle  as needed. (Patient not taking: Reported on 03/03/2024)     No current facility-administered medications on file prior to visit.   Allergies  Allergen Reactions   Hydrocodone Itching   Tessalon Perles [Benzonatate] Rash    itching   Current Medications, Allergies, Past Medical History, Past Surgical History, Family History and Social History were reviewed in [employer] record.  Review of Systems:   Constitutional: Negative for fever, sweats, chills or weight loss.  Respiratory: Negative for shortness of breath.   Cardiovascular: Negative for chest pain, palpitations and leg swelling.  Gastrointestinal: See HPI.  Musculoskeletal: + Chronic back pain. Neurological: Negative for dizziness, headaches or paresthesias.   Physical Exam: BP 100/80 (BP Location: Left Arm, Patient Position: Sitting, Cuff Size: Large)   Pulse 84   Ht 5' 9.5" (1.765 m) Comment: height measured without shoes  Wt 229 lb 2 oz (103.9 kg)   BMI 33.35 kg/m   General: 53 year old male in no acute distress. Head: Normocephalic and atraumatic. Eyes: No scleral icterus. Conjunctiva pink . Ears: Normal auditory acuity. Mouth: Dentition intact. No ulcers or lesions.  Lungs: Clear throughout to auscultation. Heart: Regular rate and rhythm, no murmur. Abdomen: Soft, nontender and nondistended. No masses or hepatomegaly. Normal bowel sounds x 4 quadrants.  Rectal: Deferred.  Musculoskeletal: Symmetrical with no gross deformities. Extremities: No edema. Neurological: Alert oriented x 4. No focal deficits.  Psychological: Alert and cooperative. Normal mood and affect  Assessment and Recommendations:  53 year old male with GERD and dysphagia. 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 His most recent EGD was 09/24/2022 which showed a normal esophagus and mild gastritis, biopsies were negative for H. pylori. On Nasacort  spray, potentially at risk for candidiasis esophagitis.  Patient reported recent laryngoscopy per ENT was unrevealing. -EGD with possible esophageal dilatation benefits and risks discussed including risk with sedation, risk of bleeding, perforation and infection  -Continue Pantoprazole  40mg  bid and Famotidine 40mg  Q HS -Avoid eating large pieces of meat, bread or rice. Cut food into small pieces and chew food thoroughly - Patient to contact office if symptoms worsen prior to EGD date - If EGD unrevealing, consider empiric esophageal dilatation. If no improvement may require further evaluation for cricopharyngeal bar dysfunction  Chronic constipation  -Continue Amitiza  24mcg bid -Continue Miralax and Dulcolax as needed  Colon caner screening. Normal colonoscopy 03/2019 -Next screening colonoscopy due 6/20230

## 2024-03-03 NOTE — Patient Instructions (Signed)
 Continue Pantoprazole  twice a day.  You have been scheduled for an endoscopy. Please follow written instructions given to you at your visit today.  If you use inhalers (even only as needed), please bring them with you on the day of your procedure.  If you take any of the following medications, they will need to be adjusted prior to your procedure:   DO NOT TAKE 7 DAYS PRIOR TO TEST- Trulicity (dulaglutide) Ozempic, Wegovy (semaglutide) Mounjaro (tirzepatide) Bydureon Bcise (exanatide extended release)  DO NOT TAKE 1 DAY PRIOR TO YOUR TEST Rybelsus (semaglutide) Adlyxin (lixisenatide) Victoza (liraglutide) Byetta (exanatide) ___________________________________________________________________________

## 2024-03-10 DIAGNOSIS — J301 Allergic rhinitis due to pollen: Secondary | ICD-10-CM | POA: Diagnosis not present

## 2024-03-11 DIAGNOSIS — J301 Allergic rhinitis due to pollen: Secondary | ICD-10-CM | POA: Diagnosis not present

## 2024-03-11 NOTE — Progress Notes (Signed)
 Addendum: Reviewed and agree with assessment and management plan. Asha Grumbine, Carie Caddy, MD

## 2024-03-17 DIAGNOSIS — J301 Allergic rhinitis due to pollen: Secondary | ICD-10-CM | POA: Diagnosis not present

## 2024-03-18 DIAGNOSIS — K430 Incisional hernia with obstruction, without gangrene: Secondary | ICD-10-CM | POA: Diagnosis not present

## 2024-03-24 DIAGNOSIS — J301 Allergic rhinitis due to pollen: Secondary | ICD-10-CM | POA: Diagnosis not present

## 2024-03-30 DIAGNOSIS — N400 Enlarged prostate without lower urinary tract symptoms: Secondary | ICD-10-CM | POA: Diagnosis not present

## 2024-03-30 DIAGNOSIS — N500 Atrophy of testis: Secondary | ICD-10-CM | POA: Diagnosis not present

## 2024-03-30 DIAGNOSIS — R972 Elevated prostate specific antigen [PSA]: Secondary | ICD-10-CM | POA: Diagnosis not present

## 2024-03-31 ENCOUNTER — Other Ambulatory Visit: Payer: Self-pay | Admitting: Urology

## 2024-03-31 ENCOUNTER — Encounter: Payer: Self-pay | Admitting: Urology

## 2024-03-31 DIAGNOSIS — J301 Allergic rhinitis due to pollen: Secondary | ICD-10-CM | POA: Diagnosis not present

## 2024-03-31 DIAGNOSIS — R972 Elevated prostate specific antigen [PSA]: Secondary | ICD-10-CM

## 2024-04-02 ENCOUNTER — Ambulatory Visit
Admission: RE | Admit: 2024-04-02 | Discharge: 2024-04-02 | Disposition: A | Discharge status: Home / Self Care | Source: Ambulatory Visit | Attending: Urology | Admitting: Urology

## 2024-04-02 DIAGNOSIS — R972 Elevated prostate specific antigen [PSA]: Secondary | ICD-10-CM | POA: Diagnosis not present

## 2024-04-02 MED ORDER — GADOPICLENOL 0.5 MMOL/ML IV SOLN
10.0000 mL | Freq: Once | INTRAVENOUS | Status: AC | PRN
  Administered 2024-04-02: 10 mL via INTRAVENOUS

## 2024-04-06 DIAGNOSIS — N500 Atrophy of testis: Secondary | ICD-10-CM | POA: Diagnosis not present

## 2024-04-06 DIAGNOSIS — R972 Elevated prostate specific antigen [PSA]: Secondary | ICD-10-CM | POA: Diagnosis not present

## 2024-04-09 ENCOUNTER — Encounter: Payer: Self-pay | Admitting: Internal Medicine

## 2024-04-14 DIAGNOSIS — J301 Allergic rhinitis due to pollen: Secondary | ICD-10-CM | POA: Diagnosis not present

## 2024-04-15 DIAGNOSIS — M546 Pain in thoracic spine: Secondary | ICD-10-CM | POA: Insufficient documentation

## 2024-04-15 DIAGNOSIS — K5900 Constipation, unspecified: Secondary | ICD-10-CM | POA: Insufficient documentation

## 2024-04-15 DIAGNOSIS — M7918 Myalgia, other site: Secondary | ICD-10-CM | POA: Insufficient documentation

## 2024-04-15 DIAGNOSIS — M5414 Radiculopathy, thoracic region: Secondary | ICD-10-CM | POA: Insufficient documentation

## 2024-04-19 ENCOUNTER — Ambulatory Visit (AMBULATORY_SURGERY_CENTER): Admitting: Internal Medicine

## 2024-04-19 ENCOUNTER — Encounter: Payer: Self-pay | Admitting: Internal Medicine

## 2024-04-19 VITALS — BP 109/64 | HR 65 | Temp 98.2°F | Resp 13 | Ht 69.5 in | Wt 229.0 lb

## 2024-04-19 DIAGNOSIS — K3189 Other diseases of stomach and duodenum: Secondary | ICD-10-CM

## 2024-04-19 DIAGNOSIS — E785 Hyperlipidemia, unspecified: Secondary | ICD-10-CM | POA: Diagnosis not present

## 2024-04-19 DIAGNOSIS — R933 Abnormal findings on diagnostic imaging of other parts of digestive tract: Secondary | ICD-10-CM

## 2024-04-19 DIAGNOSIS — R1310 Dysphagia, unspecified: Secondary | ICD-10-CM | POA: Diagnosis not present

## 2024-04-19 DIAGNOSIS — F32A Depression, unspecified: Secondary | ICD-10-CM | POA: Diagnosis not present

## 2024-04-19 DIAGNOSIS — I10 Essential (primary) hypertension: Secondary | ICD-10-CM | POA: Diagnosis not present

## 2024-04-19 MED ORDER — SODIUM CHLORIDE 0.9 % IV SOLN: 500.0000 mL | INTRAVENOUS | Status: DC

## 2024-04-19 NOTE — Progress Notes (Signed)
 GASTROENTEROLOGY PROCEDURE H&P NOTE   Primary Care Physician: Auston Reyes BIRCH, MD    Reason for Procedure:  Dysphagia symptom  Plan:    EGD with probable dilation  Patient is appropriate for endoscopic procedure(s) in the ambulatory ([facility]) setting.  The nature of the procedure, as well as the risks, benefits, and alternatives were carefully and thoroughly reviewed with the patient. Ample time for discussion and questions allowed. The patient understood, was satisfied, and agreed to proceed.     HPI: Walter Edwards is a 53 y.o. male who presents for EGD.  Medical history as below.  No recent chest pain or shortness of breath.  No abdominal pain today.  Past Medical History:  Diagnosis Date   Allergy    Chronic back pain    Gastritis    GERD (gastroesophageal reflux disease)    History of kidney stones    Hyperlipidemia    Hypertension    Inguinal hernia    Internal hemorrhoids    Low HDL (under 40)    Mild depression    Otitis media    Seasonal rhinitis     Past Surgical History:  Procedure Laterality Date   BACK SURGERY     x3   CHOLECYSTECTOMY  06/02/2020   CHOLECYSTECTOMY N/A 06/02/2020   Procedure: LAPAROSCOPIC CHOLECYSTECTOMY;  Surgeon: Vernetta Berg, MD;  Location: [facility];  Service: General;  Laterality: N/A;   COLONOSCOPY     ENDOSCOPIC PLANTAR FASCIOTOMY  05/23/2020   INCISIONAL HERNIA REPAIR N/A 11/04/2023   Procedure: OPEN INCISIONAL HERNIA REPAIR WITH MESH;  Surgeon: Vernetta Berg, MD;  Location: [facility];  Service: General;  Laterality: N/A;   VENTRAL HERNIA REPAIR N/A 07/17/2021   Procedure: VENTRA HERNIA REPAIR;  Surgeon: Vernetta Berg, MD;  Location: [facility];  Service: General;  Laterality: N/A;    Prior to Admission medications   Medication Sig Start Date End Date Taking? Authorizing Provider  amLODipine  (NORVASC ) 10 MG tablet Take 1 tablet (10 mg total) by mouth daily. 05/25/21  Yes Velma Raisin, MD   azelastine (ASTELIN) 0.1 % nasal spray Place into both nostrils as needed. 03/31/23  Yes [provider]  carbamazepine (TEGRETOL) 200 MG tablet Take 200 mg by mouth daily.   Yes [provider]  cyclobenzaprine (FLEXERIL) 10 MG tablet Take 10 mg by mouth daily as needed (Back pain).  04/04/20  Yes [provider]  docusate sodium (COLACE) 250 MG capsule Take 500 mg by mouth daily after lunch.   Yes [provider]  DULoxetine (CYMBALTA) 30 MG capsule Take 30 mg by mouth daily.   Yes [provider]  famotidine (PEPCID) 40 MG tablet Take 1 tablet by mouth at bedtime. 02/02/24 02/01/25 Yes [provider]  fexofenadine  (ALLEGRA ) 180 MG tablet Take 1 tablet (180 mg total) by mouth daily. 03/29/20  Yes Velma Raisin, MD  lubiprostone  (AMITIZA ) 24 MCG capsule TAKE ONE CAPSULE BY MOUTH TWICE DAILY WITH A MEAL 12/01/23  Yes Lindy Pennisi, Gordy HERO, MD  montelukast  (SINGULAIR ) 10 MG tablet TAKE 1 TABLET BY MOUTH EVERY MORNING FORALLERGIES 07/06/21  Yes Velma Raisin, MD  Multiple Vitamin (MULTIVITAMIN) tablet Take 1 tablet by mouth daily.   Yes [provider]  naloxegol  oxalate (MOVANTIK ) 25 MG TABS tablet Take 1 tablet (25 mg total) by mouth daily. 10/23/23  Yes Roarke Marciano, Gordy HERO, MD  pantoprazole  (PROTONIX ) 40 MG tablet TAKE 1 TABLET BY MOUTH ONCE A DAY 07/18/22  Yes Auston Reyes BIRCH,  MD  tamsulosin (FLOMAX) 0.4 MG CAPS capsule Take 0.4 mg by mouth daily. 06/25/22  Yes [provider]  tiZANidine (ZANAFLEX) 2 MG tablet Take 2 mg by mouth 3 (three) times daily. 03/18/24  Yes [provider]  triamcinolone  (NASACORT ) 55 MCG/ACT AERO nasal inhaler Place 2 sprays into the nose daily. 01/04/21  Yes Padgett, Danita Macintosh, MD  clotrimazole -betamethasone (LOTRISONE) cream Apply 1 Application topically as needed. Patient not taking: Reported on 04/19/2024    [provider]  diphenhydrAMINE (BENADRYL) 25 MG tablet Take 25 mg by mouth See admin  instructions. Takes with hydrocodone to prevent itching as needed    [provider]  EPINEPHrine  0.3 mg/0.3 mL IJ SOAJ injection Inject 0.3 mg into the muscle as needed. Patient not taking: Reported on 04/19/2024 01/04/21   [provider]  HYDROcodone-acetaminophen  (NORCO) 10-325 MG tablet Take by mouth as needed. 10/10/23   [provider]  ibuprofen (ADVIL) 800 MG tablet Take 1 tablet by mouth 3 (three) times daily as needed. 07/05/22   [provider]  polyethylene glycol powder (GLYCOLAX/MIRALAX) 17 GM/SCOOP powder Take 1 Scoop by mouth as needed. Clear lax    [provider]  Sennosides-Docusate Sodium (EQ STOOL SOFTENER/LAXATIVE PO) Take 1 tablet by mouth daily.    [provider]  Vit C-Quercet-Bioflv-Bromelain 450-250-125-50 MG CAPS Take 2 Doses by mouth 3 (three) times daily between meals. 11/21/20   Bridgett Fallow, MD    Current Outpatient Medications  Medication Sig Dispense Refill   amLODipine  (NORVASC ) 10 MG tablet Take 1 tablet (10 mg total) by mouth daily. 90 tablet 3   azelastine (ASTELIN) 0.1 % nasal spray Place into both nostrils as needed.     carbamazepine (TEGRETOL) 200 MG tablet Take 200 mg by mouth daily.     cyclobenzaprine (FLEXERIL) 10 MG tablet Take 10 mg by mouth daily as needed (Back pain).      docusate sodium (COLACE) 250 MG capsule Take 500 mg by mouth daily after lunch.     DULoxetine (CYMBALTA) 30 MG capsule Take 30 mg by mouth daily.     famotidine (PEPCID) 40 MG tablet Take 1 tablet by mouth at bedtime.     fexofenadine  (ALLEGRA ) 180 MG tablet Take 1 tablet (180 mg total) by mouth daily. 90 tablet 3   lubiprostone  (AMITIZA ) 24 MCG capsule TAKE ONE CAPSULE BY MOUTH TWICE DAILY WITH A MEAL 180 capsule 1   montelukast  (SINGULAIR ) 10 MG tablet TAKE 1 TABLET BY MOUTH EVERY MORNING FORALLERGIES 90 tablet 3   Multiple Vitamin (MULTIVITAMIN) tablet Take 1 tablet by mouth daily.     naloxegol  oxalate (MOVANTIK ) 25  MG TABS tablet Take 1 tablet (25 mg total) by mouth daily. 30 tablet 5   pantoprazole  (PROTONIX ) 40 MG tablet TAKE 1 TABLET BY MOUTH ONCE A DAY 90 tablet 3   tamsulosin (FLOMAX) 0.4 MG CAPS capsule Take 0.4 mg by mouth daily.     tiZANidine (ZANAFLEX) 2 MG tablet Take 2 mg by mouth 3 (three) times daily.     triamcinolone  (NASACORT ) 55 MCG/ACT AERO nasal inhaler Place 2 sprays into the nose daily. 16.5 g 5   clotrimazole -betamethasone (LOTRISONE) cream Apply 1 Application topically as needed. (Patient not taking: Reported on 04/19/2024)     diphenhydrAMINE (BENADRYL) 25 MG tablet Take 25 mg by mouth See admin instructions. Takes with hydrocodone to prevent itching as needed     EPINEPHrine  0.3 mg/0.3 mL IJ SOAJ injection Inject 0.3 mg into the muscle  as needed. (Patient not taking: Reported on 04/19/2024)     HYDROcodone-acetaminophen  (NORCO) 10-325 MG tablet Take by mouth as needed.     ibuprofen (ADVIL) 800 MG tablet Take 1 tablet by mouth 3 (three) times daily as needed.     polyethylene glycol powder (GLYCOLAX/MIRALAX) 17 GM/SCOOP powder Take 1 Scoop by mouth as needed. Clear lax     Sennosides-Docusate Sodium (EQ STOOL SOFTENER/LAXATIVE PO) Take 1 tablet by mouth daily.     Vit C-Quercet-Bioflv-Bromelain 450-250-125-50 MG CAPS Take 2 Doses by mouth 3 (three) times daily between meals. 42 capsule 0   Current Facility-Administered Medications  Medication Dose Route Frequency Provider Last Rate Last Admin   0.9 %  sodium chloride  infusion  500 mL Intravenous Continuous Haivyn Oravec, Gordy HERO, MD        Allergies as of 04/19/2024 - Review Complete 04/19/2024  Allergen Reaction Noted   Hydrocodone Itching 08/13/2019   Tessalon perles [benzonatate] Rash 11/11/2019    Family History  Problem Relation Age of Onset   Diabetes Mother    Anxiety disorder Mother    Hypertension Mother    Diabetes Father    Hypertension Father    Obesity Sister    Hypertension Sister    Colon cancer Neg Hx     Pancreatic cancer Neg Hx    Esophageal cancer Neg Hx    Liver cancer Neg Hx     Social History   Socioeconomic History   Marital status: Single    Spouse name: Not on file   Number of children: 1   Years of education: [employer] education level: Not on file  Occupational History   Not on file  Tobacco Use   Smoking status: Never   Smokeless tobacco: Former  Building services engineer status: Never Used  Substance and Sexual Activity   Alcohol use: Yes    Comment: on the weekends maybe   Drug use: Never   Sexual activity: Yes    Birth control/protection: Condom  Other Topics Concern   Not on file  Social History Narrative   11/11/19   From: the area   Living: alone   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 [place]      Family: 1 daughter - Waddell - 2003      Enjoys: relax, outdoor activity, hiking, concerts      Exercise: hiking, 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, 9-10K steps per day   Diet: had done low carb, but not doing great now, meat/veggies/fruit      Safety   Seat belts: Yes    Guns: Yes  and secure   Safe in relationships: Yes    Social Drivers of Corporate investment banker Strain: Low Risk  (02/02/2024)   Received from [facility]   Overall Financial Resource Strain (CARDIA)    Difficulty of Paying Living Expenses: Not hard at all  Food Insecurity: No Food Insecurity (02/02/2024)   Received from [facility]   Hunger Vital Sign    Within the past 12 months, you worried that your food would run out before you got the money to buy more.: Never true    Within the past 12 months, the food you bought just didn't last and you didn't have money to get more.: Never true  Transportation Needs: No Transportation Needs (02/02/2024)   Received from [facility] - Transportation    In  the past 12 months, has lack of transportation kept you from medical appointments or from getting  medications?: No    Lack of Transportation (Non-Medical): No  Physical Activity: Not on file  Stress: Not on file  Social Connections: Unknown (04/30/2022)   Received from [facility]   Social Network    Social Network: Not on file  Intimate Partner Violence: Unknown (04/30/2022)   Received from [facility]   HITS    Physically Hurt: Not on file    Insult or Talk Down To: Not on file    Threaten Physical Harm: Not on file    Scream or Curse: Not on file    Physical Exam: Vital signs in last 24 hours: @BP  (!) 136/97   Pulse 76   Temp 98.2 F (36.8 C)   Ht 5' 9.5 (1.765 m)   Wt 229 lb (103.9 kg)   SpO2 100%   BMI 33.33 kg/m  GEN: NAD EYE: Sclerae anicteric ENT: MMM CV: Non-tachycardic Pulm: CTA b/l GI: Soft, NT/ND NEURO:  Alert & Oriented x 3   Gordy Starch, MD [facility]  04/19/2024 10:22 AM

## 2024-04-19 NOTE — Progress Notes (Signed)
 Called to room to assist during endoscopic procedure.  Patient ID and intended procedure confirmed with present staff. Received instructions for my participation in the procedure from the performing physician.

## 2024-04-19 NOTE — Patient Instructions (Signed)

## 2024-04-19 NOTE — Op Note (Signed)
 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 The patient's tolerance of                            previous anesthesia was also reviewed. The risks                            and benefits of the procedure and the sedation                            options and risks were discussed with the patient.                            All questions were answered, and informed consent                            was obtained. Prior Anticoagulants: The patient has                            taken no anticoagulant or antiplatelet agents. ASA                            Grade Assessment: II - A patient with mild systemic                            disease. After reviewing the risks and benefits,                            the patient was deemed in satisfactory condition to                            undergo the procedure.  After obtaining informed consent, the endoscope was                            passed under direct vision. Throughout the                            procedure, the patient's blood pressure, pulse, and                            oxygen saturations were monitored continuously. The                            Olympus Scope [id number] was introduced through the                            mouth, and  advanced to the second part of duodenum.                            The upper GI endoscopy was accomplished without                            difficulty. The patient tolerated the procedure                            well. Scope In: Scope Out: Findings:                 No endoscopic abnormality was evident in the                            esophagus to explain the patient's complaint of                            dysphagia. It was decided, however, to proceed with                            dilation of the entire esophagus. The scope was                            withdrawn. Dilation was performed with a Maloney                            dilator with mild resistance at 54 Fr.                           Mildly erythematous mucosa without bleeding was                            found in the gastric antrum and in the prepyloric                            region of the stomach. Biopsied previously  performed and thus not repeated today.                           The examined duodenum was normal. Complications:            No immediate complications. Estimated Blood Loss:     Estimated blood loss: none. Impression:               - No endoscopic esophageal abnormality to explain                            patient's dysphagia. Esophagus dilated with 54 Fr                            Maloney.                           - Erythematous mucosa in the antrum and prepyloric                            region of the stomach. Biopsied at last EGD and not                            repeated today.                           - Normal examined duodenum.                           - No specimens collected. Recommendation:           - Patient has a contact number available for                            emergencies. The signs and symptoms of potential                            delayed complications were discussed with the                            patient. Return to normal activities  tomorrow.                            Written discharge instructions were provided to the                            patient.                           - Resume previous diet.                           - Continue present medications.                           - If no response to esophageal dilation ENT consult  could be considered for further assessment of                            cricopharyngeal bar. Gordy CHRISTELLA Starch, MD 04/19/2024 10:40:02 AM This report has been signed electronically.

## 2024-04-19 NOTE — Progress Notes (Signed)
 Vss nad trans to pacu

## 2024-04-20 ENCOUNTER — Telehealth: Payer: Self-pay

## 2024-04-20 NOTE — Telephone Encounter (Signed)
  Follow up Call-     04/19/2024    9:41 AM 09/24/2022    8:55 AM  Call back number  Post procedure Call Back phone  # [phone]  Permission to leave phone message Yes Yes     Patient questions:  Do you have a fever, pain , or abdominal swelling? No. Pain Score  0 *  Have you tolerated food without any problems? Yes.    Have you been able to return to your normal activities? Yes.    Do you have any questions about your discharge instructions: Diet   No. Medications  No. Follow up visit  No.  Do you have questions or concerns about your Care? No.  Actions: * If pain score is 4 or above: No action needed, pain <4.

## 2024-04-21 DIAGNOSIS — J301 Allergic rhinitis due to pollen: Secondary | ICD-10-CM | POA: Diagnosis not present

## 2024-04-28 DIAGNOSIS — J301 Allergic rhinitis due to pollen: Secondary | ICD-10-CM | POA: Diagnosis not present

## 2024-05-05 DIAGNOSIS — J301 Allergic rhinitis due to pollen: Secondary | ICD-10-CM | POA: Diagnosis not present

## 2024-05-12 DIAGNOSIS — J301 Allergic rhinitis due to pollen: Secondary | ICD-10-CM | POA: Diagnosis not present

## 2024-05-19 DIAGNOSIS — M47812 Spondylosis without myelopathy or radiculopathy, cervical region: Secondary | ICD-10-CM | POA: Diagnosis not present

## 2024-05-19 DIAGNOSIS — M25862 Other specified joint disorders, left knee: Secondary | ICD-10-CM | POA: Diagnosis not present

## 2024-05-19 DIAGNOSIS — M4312 Spondylolisthesis, cervical region: Secondary | ICD-10-CM | POA: Diagnosis not present

## 2024-05-19 DIAGNOSIS — Z981 Arthrodesis status: Secondary | ICD-10-CM | POA: Diagnosis not present

## 2024-05-19 DIAGNOSIS — M21162 Varus deformity, not elsewhere classified, left knee: Secondary | ICD-10-CM | POA: Diagnosis not present

## 2024-05-19 DIAGNOSIS — G8929 Other chronic pain: Secondary | ICD-10-CM | POA: Diagnosis not present

## 2024-05-19 DIAGNOSIS — J301 Allergic rhinitis due to pollen: Secondary | ICD-10-CM | POA: Diagnosis not present

## 2024-05-19 DIAGNOSIS — M545 Low back pain, unspecified: Secondary | ICD-10-CM | POA: Diagnosis not present

## 2024-05-19 DIAGNOSIS — M21161 Varus deformity, not elsewhere classified, right knee: Secondary | ICD-10-CM | POA: Diagnosis not present

## 2024-05-19 DIAGNOSIS — M25861 Other specified joint disorders, right knee: Secondary | ICD-10-CM | POA: Diagnosis not present

## 2024-05-19 NOTE — Progress Notes (Signed)
 Division of Spine Surgery      Referring Physician:  Self No address on file  Primary Physician:  Auston Reyes BIRCH, MD  Chief Complaint:  chronic thoracic back pain.  History of Present Illness: History of Present Illness Walter Edwards is a 53 year old male who presents with chronic thoracic back pain.  He has been experiencing chronic thoracic back pain since April 23, 2018, following a fall during [employer]. 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 Initially intermittent, the pain has progressed to a daily occurrence, significantly impacting his daily activities and work duties.  Multiple diagnostic evaluations, including MRIs of the back and abdomen, have not revealed significant findings. He has undergone three injections in the thoracic spine. The pain persists, particularly with bending or physical activities, and is alleviated by sitting or reclining. He has tried various medications, including hydrocodone, ibuprofen, gabapentin , and pregabalin , with limited success. Constipation related to medication use has been noted.  His past medical history includes three lower back surgeries, including lumbar fusion, and gallbladder removal, which did not alleviate his symptoms. He has also been evaluated for abdominal issues, including a hernia repair, without resolution of the pain. He reports additional symptoms of leg cramps and foot pain, particularly in the left foot, persistent since prior back surgeries. No bowel or bladder incontinence, but constipation issues due to medication are present.  Current medications include duloxetine and muscle relaxants, with sparing use of hydrocodone due to side effects. He has not tried acupuncture or chiropractic treatment. 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  The symptoms are causing a significant impact on the patient's life.    Review of Systems:  A Review of Systems was performed and the patient's pertinent positives and negatives are documented in HPI, all others systems are negative.   Past Medical History: Past Medical History:  Diagnosis Date  . Allergy 2010  . Depression 2024  . GERD (gastroesophageal reflux disease) 2014  . Hypertension 2019     Past Surgical History: Past Surgical History:  Procedure Laterality Date  . CHOLECYSTECTOMY  05/2020  . REPAIR UMBILICAL HERNIA  06/21/2021  . APPENDECTOMY     Multiple  . COLON SURGERY  2022  . HERNIA REPAIR  2022  . SPINE SURGERY     Multiple over 15 + years     Problem List: Patient Active Problem List  Diagnosis  . Athlete's foot on left  . Acute back pain with sciatica  . Backache  . Chronic back pain  . Chronic low back pain  . Chronic lumbosacral pain  . Other chronic pain  . Cyst on ear  . DDD (degenerative disc disease), lumbosacral  . Elevated PSA, less than 10 ng/ml  . Essential hypertension  . Fatty liver  . Gastroesophageal reflux disease  . History of fusion of lumbar spine  . Plantar fasciitis  . RUQ abdominal pain  . Seasonal allergic rhinitis  . Class 1 obesity due to excess calories with serious comorbidity and body mass index (BMI) of 33.0 to 33.9 in adult     Allergies: Allergies as of 05/19/2024 - Reviewed 05/19/2024  Allergen Reaction Noted  . Hydrocodone Itching 05/11/2021  . Benzonatate Rash 11/11/2019     Medications: Outpatient Encounter Medications as of 05/19/2024  Medication Sig Dispense Refill  .  amLODIPine  (NORVASC ) 10 MG tablet TAKE ONE TABLET BY MOUTH ONCE A DAY 90 tablet 1  . carBAMazepine (TEGRETOL) 200 mg tablet Take 0.5 tablets (100 mg total) by mouth 2 (two) times daily 30 tablet 5  . clotrimazole -betamethasone (LOTRISONE) 1-0.05 % cream Apply topically 2 (two) times daily 30 g 2  . cyclobenzaprine  (FLEXERIL) 10 MG tablet Take 1 tablet by mouth 3 (three) times daily as needed for Muscle spasms    . docusate (COLACE) 250 MG capsule Take by mouth    . DULoxetine (CYMBALTA) 30 MG DR capsule TAKE ONE CAPSULE BY MOUTH ONCE DAILY 30 capsule 2  . EPINEPHrine  (EPIPEN ) 0.3 mg/0.3 mL auto-injector     . famotidine (PEPCID) 40 MG tablet Take 1 tablet (40 mg total) by mouth at bedtime 90 tablet 3  . fexofenadine  (ALLEGRA ) 180 MG tablet Take by mouth    . fluticasone propionate (FLONASE) 50 mcg/actuation nasal spray     . HYDROcodone-acetaminophen  (NORCO) 10-325 mg tablet Take by mouth every 8 (eight) hours    . ibuprofen (MOTRIN) 800 MG tablet Take 800 mg by mouth 3 (three) times daily    . lubiprostone  (AMITIZA ) 24 MCG capsule Take 24 mcg by mouth 2 (two) times daily with meals    . montelukast  (SINGULAIR ) 10 mg tablet TAKE ONE TABLET BY MOUTH EVERY MORNING FOR ALLERGIES 90 tablet 1  . multivitamin tablet Take 1 tablet by mouth once daily    . pantoprazole  (PROTONIX ) 40 MG DR tablet Take 1 tablet (40 mg total) by mouth 2 (two) times daily before meals 180 tablet 3  . polyethylene glycol (MIRALAX) powder Take by mouth    . tamsulosin (FLOMAX) 0.4 mg capsule TAKE ONE CAPSULE BY MOUTH ONCE DAILY. TAKE 30 MINS AFTER SAME MEAL EVERY DAY 90 capsule 1   No facility-administered encounter medications on file as of 05/19/2024.     Social History: Social History   Tobacco Use  . Smoking status: Never  . Smokeless tobacco: Never  . Tobacco comments:    Stopped dipping and chewing in 2001  Vaping Use  . Vaping status: Never Used  Substance Use Topics  . Alcohol use: Yes    Alcohol/week: 3.0 standard drinks of alcohol    Types: 3 Cans of beer per week  . Drug use: Never    Family Medical History: Family History  Problem Relation Name Age of Onset  . High blood pressure (Hypertension) Mother Romero Ahle [place]   . Hyperlipidemia (Elevated cholesterol) Mother Romero Ahle [place]   . Diabetes  Mother Romero Ahle [place]   . Bipolar disorder Mother Romero Ahle [place]   . Colon polyps Mother Romero [facility]le [facility]   . Depression Mother Romero Ahle [place]   . Diabetes type II Mother Romero Ahle [place]   . High blood pressure (Hypertension) Father Madeleine Ryder [place]   . Hyperlipidemia (Elevated cholesterol) Father Robben Jagiello   . Diabetes Father Donaldo Teegarden   . High blood pressure (Hypertension) Sister Greycen Felter   . Alzheimer's disease Paternal Grandfather Bebe Ahle      Physical Examination: Vitals:   05/19/24 1558  PainSc:   8  PainLoc: Back    General: Patient is well developed, well nourished, calm, collected, and in no apparent distress.  Psychiatric: Patient is non-anxious.  Neck:   Supple.  Full range of motion.  Respiratory: Patient is breathing without any difficulty.  Extremities: No edema.  Skin:   On exposed skin, there are  no abnormal skin lesions.   NEUROLOGICAL:  General: In no acute distress. Awake, alert, oriented to person, place, and time.  Pupils equal round and reactive to light.   Strength: Side Biceps Triceps Deltoid Interossei Grip Wrist Ext. Wrist Flex.  R 5 5 5 5 5 5 5   L 5 5 5 5 5 5 5     Side Iliopsoas Quads Hamstring PF DF EHL  R 5 5 5 5 5 5   L 5 5 5 5 5 5     Reflexes are 2+ patella and achilles.   Bilateral upper and lower extremity sensation is intact to light touch and pin prick.  Clonus is not present.  Toes are down-going.  Gait is intact.  No difficulty with tandem gait.  Hoffman's is absent.   DIAGNOSTIC TESTS: Xray 05/19/24     04/15/23 MRI thoracic IMPRESSION:  1. No acute process or significant interval change.  2.  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 Previously nomenclature was utilized for this examination as well.  3.  Mild spondylosis without any significant stenosis.  Reviewed diagnostic test(s) with patient.       * No data to display              05/16/2024    9:44 AM 05/16/2024    9:43 AM 05/16/2024    9:41 AM  SPINE SCORING PORTION OF QUESTIONNAIRES  NDI SCORE-PERCENTAGE   6 (No Disability)  ODI SCORE-PERCENTAGE   54 (Severe Disability)  NECK PAIN   1 (None)  ARM PAIN   0 (None)  BACK PAIN   7 (Severe)  LEG PAIN   5 (Moderate)  PROMIS Pain Interference T-Score (range: 10 - 90) 74 (severe)*    --     PROMIS Physical Function T-Score  32 (moderate dysfunction)*   [facility] MyChart PROMIS Global Health Scoring (Physical)  32.4 (Moderate Symptoms)   [facility] MyChart PROMIS Global Health Scoring (Mental)  33.8 (Moderate Symptoms)        Assessment and Plan: We reviewed the patient's past medical history, past surgical history, medications, allergies, family history, social history, review of systems, current complaints and radiography file in detail. We spent a total of 30 minutes in both face-to-face and non-face-to-face activities for this visit on the date of this encounter. We spent more than 50% of that time counseling the patient in regard to the natural progression of the patient's condition as well as possible treatment options (non-surgical and surgical).  Assessment & Plan Chronic thoracic back pain Chronic thoracic back pain for over a year. Current MRI outdated, reassessment needed. Potential inflammation may benefit from targeted intervention. Emphasized non-surgical options and minimally invasive procedures if surgery necessary. - Order MRI of thoracic spine. - Order CT SPECT scan. - Refer to physical therapy close to home. - Consider specialized injection based on CT SPECT findings. - Advise against surgery unless at wit's end. - Recommend minimally invasive surgery if necessary.  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 Previous open MRIs and medication insufficient. Traumatic experience contributing to condition. - Prescribe Ativan for MRI procedure. - Allow  MRI at preferred location with strong medication.   I have personally interviewed and examined the patient. I have reviewed the provider's history, physical exam, assessment and management plans. I concur with or have edited all elements of the APP's note.    This note was partially dictated using voice recognition software, so please excuse any errors that were not corrected.  Answers submitted by the patient for this visit:  (Submitted on 05/16/2024) How long ago did your symptoms begin?: 4 Years Spine HPI Questionnaire (Submitted on 05/16/2024) Chief Complaint: HPI - Spine How did your symptoms begin?: gradually with injury What is the cause of your spine pain?: a fall, a sudden movement, bending foward, lifting a heavy object What is the location of your primary area of pain?: middle back What best describes your primary area of pain? (choose all that apply): burning, throbbing, tingling What is the severity of your primary area of pain?: severe How often does the pain occur?: constant with intermittent worsening What other locations do you have pain?: central lower back, waist, right leg, left leg What best describes your pain? (choose all that apply): achy, cramping, dull What is the severity of your other area(s) of pain?: moderate How often does the pain occur?: intermittent Since the onset of your problem, has the pain improved, worsened, or stayed the same?: worsened Rate the pain on a scale of 0 (no pain) to 10 (worst pain ever): 7/10 Choose all the activities that make your pain worse: bending foward, exercise, lifting, walking Choose all the activities that make your pain feel better: bending backward, lying down, rest, sitting Choose all the activities that are affected by your pain/problem: chores, sexual activities, work Please choose any prior/current treatments you have tried: cold, heat, massage, physical therapy, TENS/electric stim Have you had massage therapy in the  last 12 months?: Yes How many massage therapy sessions have you had in the last 12 months?: 1 When was the last massage therapy session?: 1-4 weeks ago Was your last physical therapy session in the last 12 months?: No Please select all the medications that you have tried for this problem: muscle relaxants, NSAIDs (anti-inflammatories), pain meds Please choose all the studies you have had for this problem: CT scan, MRI What is your current work status?: disabled due to this problem  (Submitted on 05/16/2024) When was the date of your injury?: 01/22/2018 Is this a work-related injury?: Yes Have you filed a worker's compensation claim?: No Is this a sports-related injury?: No Muscle Relaxants - Please indicate which meds have/have not helped with your pain (Submitted on 05/16/2024) Cyclobenzaprine (Flexeril): does not help  (Submitted on 05/16/2024) Naproxen (Aleve): does not help Ibuprofen (Advil): does not help Ibuprofen (Motrin): does not help Diclofenac  (Arthrotec): does not help Diclofenac  (Voltaren ): does not help  (Submitted on 05/16/2024) Hydrocodone (Norco): does not help  (Submitted on 05/16/2024) Heat: helps Massage: does not help Physical therapy: does not help TENS/electric stim: does not help Questionnaire about: HPI - Spine (Submitted on 05/16/2024) Chief Complaint: HPI - Spine  Attestation Statement:   I personally saw the patient and performed a substantive portion of the medical decision making, in conjunction with the Advanced Practice Provider for the condition/treatment of this patient .  MUHAMMAD MOSTAFA ABD-EL-BARR, MD   *Some images could not be shown.

## 2024-05-26 DIAGNOSIS — J301 Allergic rhinitis due to pollen: Secondary | ICD-10-CM | POA: Diagnosis not present

## 2024-05-27 DIAGNOSIS — J301 Allergic rhinitis due to pollen: Secondary | ICD-10-CM | POA: Diagnosis not present

## 2024-05-28 ENCOUNTER — Encounter: Payer: Self-pay | Admitting: Internal Medicine

## 2024-06-02 DIAGNOSIS — J301 Allergic rhinitis due to pollen: Secondary | ICD-10-CM | POA: Diagnosis not present

## 2024-06-03 DIAGNOSIS — G8929 Other chronic pain: Secondary | ICD-10-CM | POA: Diagnosis not present

## 2024-06-03 DIAGNOSIS — S2231XD Fracture of one rib, right side, subsequent encounter for fracture with routine healing: Secondary | ICD-10-CM | POA: Diagnosis not present

## 2024-06-03 DIAGNOSIS — W1839XD Other fall on same level, subsequent encounter: Secondary | ICD-10-CM | POA: Diagnosis not present

## 2024-06-03 DIAGNOSIS — M47815 Spondylosis without myelopathy or radiculopathy, thoracolumbar region: Secondary | ICD-10-CM | POA: Diagnosis not present

## 2024-06-03 DIAGNOSIS — M545 Low back pain, unspecified: Secondary | ICD-10-CM | POA: Diagnosis not present

## 2024-06-09 DIAGNOSIS — J301 Allergic rhinitis due to pollen: Secondary | ICD-10-CM | POA: Diagnosis not present

## 2024-06-11 DIAGNOSIS — M546 Pain in thoracic spine: Secondary | ICD-10-CM | POA: Diagnosis not present

## 2024-06-11 DIAGNOSIS — G8929 Other chronic pain: Secondary | ICD-10-CM | POA: Diagnosis not present

## 2024-06-14 ENCOUNTER — Other Ambulatory Visit: Payer: Self-pay | Admitting: Internal Medicine

## 2024-06-14 DIAGNOSIS — E7801 Familial hypercholesterolemia: Secondary | ICD-10-CM | POA: Diagnosis not present

## 2024-06-14 DIAGNOSIS — E66811 Obesity, class 1: Secondary | ICD-10-CM | POA: Diagnosis not present

## 2024-06-14 DIAGNOSIS — M545 Low back pain, unspecified: Secondary | ICD-10-CM | POA: Diagnosis not present

## 2024-06-14 DIAGNOSIS — Z113 Encounter for screening for infections with a predominantly sexual mode of transmission: Secondary | ICD-10-CM | POA: Diagnosis not present

## 2024-06-14 DIAGNOSIS — R109 Unspecified abdominal pain: Secondary | ICD-10-CM | POA: Diagnosis not present

## 2024-06-14 DIAGNOSIS — Z114 Encounter for screening for human immunodeficiency virus [HIV]: Secondary | ICD-10-CM | POA: Diagnosis not present

## 2024-06-14 DIAGNOSIS — I10 Essential (primary) hypertension: Secondary | ICD-10-CM | POA: Diagnosis not present

## 2024-06-14 DIAGNOSIS — Z125 Encounter for screening for malignant neoplasm of prostate: Secondary | ICD-10-CM | POA: Diagnosis not present

## 2024-06-14 DIAGNOSIS — E6609 Other obesity due to excess calories: Secondary | ICD-10-CM | POA: Diagnosis not present

## 2024-06-14 DIAGNOSIS — Z0000 Encounter for general adult medical examination without abnormal findings: Secondary | ICD-10-CM | POA: Diagnosis not present

## 2024-06-14 DIAGNOSIS — Z0189 Encounter for other specified special examinations: Secondary | ICD-10-CM | POA: Diagnosis not present

## 2024-06-14 DIAGNOSIS — Z1331 Encounter for screening for depression: Secondary | ICD-10-CM | POA: Diagnosis not present

## 2024-06-14 DIAGNOSIS — Z79899 Other long term (current) drug therapy: Secondary | ICD-10-CM | POA: Diagnosis not present

## 2024-06-14 DIAGNOSIS — K219 Gastro-esophageal reflux disease without esophagitis: Secondary | ICD-10-CM | POA: Diagnosis not present

## 2024-06-15 DIAGNOSIS — M546 Pain in thoracic spine: Secondary | ICD-10-CM | POA: Diagnosis not present

## 2024-06-15 DIAGNOSIS — G8929 Other chronic pain: Secondary | ICD-10-CM | POA: Diagnosis not present

## 2024-06-16 DIAGNOSIS — J301 Allergic rhinitis due to pollen: Secondary | ICD-10-CM | POA: Diagnosis not present

## 2024-06-18 DIAGNOSIS — G8929 Other chronic pain: Secondary | ICD-10-CM | POA: Diagnosis not present

## 2024-06-18 DIAGNOSIS — M546 Pain in thoracic spine: Secondary | ICD-10-CM | POA: Diagnosis not present

## 2024-06-22 DIAGNOSIS — G8929 Other chronic pain: Secondary | ICD-10-CM | POA: Diagnosis not present

## 2024-06-22 DIAGNOSIS — M546 Pain in thoracic spine: Secondary | ICD-10-CM | POA: Diagnosis not present

## 2024-06-23 ENCOUNTER — Encounter: Admitting: Physician Assistant

## 2024-06-23 DIAGNOSIS — J301 Allergic rhinitis due to pollen: Secondary | ICD-10-CM | POA: Diagnosis not present

## 2024-06-24 DIAGNOSIS — G8929 Other chronic pain: Secondary | ICD-10-CM | POA: Diagnosis not present

## 2024-06-24 DIAGNOSIS — M546 Pain in thoracic spine: Secondary | ICD-10-CM | POA: Diagnosis not present

## 2024-06-29 DIAGNOSIS — G8929 Other chronic pain: Secondary | ICD-10-CM | POA: Diagnosis not present

## 2024-06-29 DIAGNOSIS — M546 Pain in thoracic spine: Secondary | ICD-10-CM | POA: Diagnosis not present

## 2024-06-30 DIAGNOSIS — J301 Allergic rhinitis due to pollen: Secondary | ICD-10-CM | POA: Diagnosis not present

## 2024-07-01 DIAGNOSIS — M545 Low back pain, unspecified: Secondary | ICD-10-CM | POA: Diagnosis not present

## 2024-07-01 DIAGNOSIS — M546 Pain in thoracic spine: Secondary | ICD-10-CM | POA: Diagnosis not present

## 2024-07-01 DIAGNOSIS — G8929 Other chronic pain: Secondary | ICD-10-CM | POA: Diagnosis not present

## 2024-07-02 DIAGNOSIS — G8929 Other chronic pain: Secondary | ICD-10-CM | POA: Diagnosis not present

## 2024-07-02 DIAGNOSIS — M546 Pain in thoracic spine: Secondary | ICD-10-CM | POA: Diagnosis not present

## 2024-07-06 DIAGNOSIS — M546 Pain in thoracic spine: Secondary | ICD-10-CM | POA: Diagnosis not present

## 2024-07-06 DIAGNOSIS — G8929 Other chronic pain: Secondary | ICD-10-CM | POA: Diagnosis not present

## 2024-07-07 DIAGNOSIS — J301 Allergic rhinitis due to pollen: Secondary | ICD-10-CM | POA: Diagnosis not present

## 2024-07-08 DIAGNOSIS — M546 Pain in thoracic spine: Secondary | ICD-10-CM | POA: Diagnosis not present

## 2024-07-08 DIAGNOSIS — G8929 Other chronic pain: Secondary | ICD-10-CM | POA: Diagnosis not present

## 2024-07-14 DIAGNOSIS — J301 Allergic rhinitis due to pollen: Secondary | ICD-10-CM | POA: Diagnosis not present

## 2024-07-19 DIAGNOSIS — M546 Pain in thoracic spine: Secondary | ICD-10-CM | POA: Diagnosis not present

## 2024-07-21 DIAGNOSIS — J301 Allergic rhinitis due to pollen: Secondary | ICD-10-CM | POA: Diagnosis not present

## 2024-07-28 DIAGNOSIS — J301 Allergic rhinitis due to pollen: Secondary | ICD-10-CM | POA: Diagnosis not present

## 2024-08-03 ENCOUNTER — Telehealth: Payer: Self-pay | Admitting: Orthopedic Surgery

## 2024-08-03 NOTE — Telephone Encounter (Signed)
 Received vm from patient stating needs copy of records to take to [facility]. IC,lmvm advising that on 12/30/23 we received an authorization to send records to [facility] which was processed on 12/30/23. Advised if he wants to obtain a copy of records for himself, he will need to come in to complete and sign authorization. I reiterated that records have already been faxed to [facility] per his authorization 12/30/23.

## 2024-08-04 DIAGNOSIS — J301 Allergic rhinitis due to pollen: Secondary | ICD-10-CM | POA: Diagnosis not present

## 2024-08-11 DIAGNOSIS — J301 Allergic rhinitis due to pollen: Secondary | ICD-10-CM | POA: Diagnosis not present

## 2024-08-18 ENCOUNTER — Ambulatory Visit (INDEPENDENT_AMBULATORY_CARE_PROVIDER_SITE_OTHER)

## 2024-08-18 DIAGNOSIS — Z1283 Encounter for screening for malignant neoplasm of skin: Secondary | ICD-10-CM | POA: Diagnosis not present

## 2024-08-18 DIAGNOSIS — L821 Other seborrheic keratosis: Secondary | ICD-10-CM | POA: Diagnosis not present

## 2024-08-18 DIAGNOSIS — L578 Other skin changes due to chronic exposure to nonionizing radiation: Secondary | ICD-10-CM

## 2024-08-18 DIAGNOSIS — D229 Melanocytic nevi, unspecified: Secondary | ICD-10-CM | POA: Diagnosis not present

## 2024-08-18 DIAGNOSIS — D239 Other benign neoplasm of skin, unspecified: Secondary | ICD-10-CM | POA: Diagnosis not present

## 2024-08-18 DIAGNOSIS — L814 Other melanin hyperpigmentation: Secondary | ICD-10-CM

## 2024-08-18 DIAGNOSIS — L570 Actinic keratosis: Secondary | ICD-10-CM | POA: Diagnosis not present

## 2024-08-18 DIAGNOSIS — J301 Allergic rhinitis due to pollen: Secondary | ICD-10-CM | POA: Diagnosis not present

## 2024-08-18 DIAGNOSIS — D1801 Hemangioma of skin and subcutaneous tissue: Secondary | ICD-10-CM

## 2024-08-18 NOTE — Patient Instructions (Addendum)

## 2024-08-18 NOTE — Progress Notes (Signed)
 Subjective   Walter Edwards is a 53 y.o. male who presents for the following: Total body skin exam for skin cancer screening and mole check. The patient has spots, moles and lesions to be evaluated, some may be new or changing and the patient may have concern these could be cancer.. Patient is new patient  Today patient reports: No areas of concern today.   Review of Systems:    No other skin or systemic complaints except as noted in HPI or Assessment and Plan.  The following portions of the chart were reviewed this encounter and updated as appropriate: medications, allergies, medical history  Relevant Medical History:  Family history of skin cancer - Mother and father; unsure of type.    Objective  Well appearing patient in no apparent distress; mood and affect are within normal limits. Examination was performed of the: Full Skin Examination: scalp, head, eyes, ears, nose, lips, neck, chest, axillae, abdomen, back, buttocks, bilateral upper extremities, bilateral lower extremities, hands, feet, fingers, toes, fingernails, and toenails.   Examination notable for: SKIN EXAM, Angioma(s): Scattered red vascular papule(s)  , Lentigo/lentigines: Scattered pigmented macules that are tan to brown in color and are somewhat non-uniform in shape and concentrated in the sun-exposed areas, Nevus/nevi: Scattered well-demarcated, regular, pigmented macule(s) and/or papule(s)  , Seborrheic Keratosis(es): Stuck-on appearing keratotic papule(s) on the trunk, none  irritated with redness, crusting, edema, and/or partial avulsion, Actinic Damage/Elastosis: chronic sun damage: dyspigmentation, telangiectasia, and wrinkling, Actinic keratosis: Scaly erythematous macule(s) concentrated on sun exposed areas  - Dermatofibroma(s): Firm dermal nodule(s) with a hyperpigmented halo and a positive dimple sign located on the left lateral thigh  Examination limited by: Undergarments and Patient deferred removal     right  pre arricular Pink scaly macules  Assessment & Plan   SKIN CANCER SCREENING PERFORMED TODAY.  BENIGN SKIN FINDINGS  - Lentigines  - Seborrheic keratoses  - Hemangiomas   - Nevus/Multiple Benign Nevi             - Dermatofibroma  - Reassurance provided regarding the benign appearance of lesions noted on exam today; no treatment is indicated in the absence of symptoms/changes. - Reinforced importance of photoprotective strategies including liberal and frequent sunscreen use of a broad-spectrum SPF 30 or greater, use of protective clothing, and sun avoidance for prevention of cutaneous malignancy and photoaging.  Counseled patient on the importance of regular self-skin monitoring as well as routine clinical skin examinations as scheduled.   ACTINIC DAMAGE - Chronic condition, secondary to cumulative UV/sun exposure - Recommend daily broad spectrum sunscreen SPF 30+ to sun-exposed areas, reapply every 2 hours as needed.  - Staying in the shade or wearing long sleeves, sun glasses (UVA+UVB protection) and wide brim hats (4-inch brim around the entire circumference of the hat) are also recommended for sun protection.  - Call for new or changing lesions.   Level of service outlined above   Procedures, orders, diagnosis for this visit:  ACTINIC KERATOSIS right pre arricular Actinic keratoses are precancerous spots that appear secondary to cumulative UV radiation exposure/sun exposure over time. They are chronic with expected duration over 1 year. A portion of actinic keratoses will progress to squamous cell carcinoma of the skin. It is not possible to reliably predict which spots will progress to skin cancer and so treatment is recommended to prevent development of skin cancer.  Recommend daily broad spectrum sunscreen SPF 30+ to sun-exposed areas, reapply every 2 hours as needed.  Recommend staying  in the shade or wearing long sleeves, sun glasses (UVA+UVB protection) and wide brim hats  (4-inch brim around the entire circumference of the hat). Call for new or changing lesions. Destruction of lesion - right pre arricular Complexity: simple   Destruction method: cryotherapy   Informed consent: discussed and consent obtained   Timeout:  patient name, date of birth, surgical site, and procedure verified Lesion destroyed using liquid nitrogen: Yes   Region frozen until ice ball extended beyond lesion: Yes   Cryo cycles: 1 or 2. Outcome: patient tolerated procedure well with no complications   Post-procedure details: wound care instructions given     Actinic keratosis -     Destruction of lesion    Return to clinic: Return in about 1 year (around 08/18/2025) for TBSE.  I, Emerick Ege, CMA am acting as scribe for Lauraine JAYSON Kanaris, MD.   Documentation: I have reviewed the above documentation for accuracy and completeness, and I agree with the above.  Lauraine JAYSON Kanaris, MD

## 2024-08-23 ENCOUNTER — Encounter: Payer: Self-pay | Admitting: Radiology

## 2024-08-25 DIAGNOSIS — J301 Allergic rhinitis due to pollen: Secondary | ICD-10-CM | POA: Diagnosis not present

## 2024-09-01 DIAGNOSIS — J301 Allergic rhinitis due to pollen: Secondary | ICD-10-CM | POA: Diagnosis not present

## 2024-09-08 DIAGNOSIS — J301 Allergic rhinitis due to pollen: Secondary | ICD-10-CM | POA: Diagnosis not present

## 2024-09-15 DIAGNOSIS — J301 Allergic rhinitis due to pollen: Secondary | ICD-10-CM | POA: Diagnosis not present

## 2024-09-21 DIAGNOSIS — R630 Anorexia: Secondary | ICD-10-CM | POA: Diagnosis not present

## 2024-09-21 DIAGNOSIS — R1013 Epigastric pain: Secondary | ICD-10-CM | POA: Diagnosis not present

## 2024-09-21 DIAGNOSIS — G8929 Other chronic pain: Secondary | ICD-10-CM | POA: Diagnosis not present

## 2024-09-21 DIAGNOSIS — R110 Nausea: Secondary | ICD-10-CM | POA: Diagnosis not present

## 2024-09-21 DIAGNOSIS — R109 Unspecified abdominal pain: Secondary | ICD-10-CM | POA: Diagnosis not present

## 2024-09-22 DIAGNOSIS — Z6833 Body mass index (BMI) 33.0-33.9, adult: Secondary | ICD-10-CM | POA: Diagnosis not present

## 2024-09-22 DIAGNOSIS — I10 Essential (primary) hypertension: Secondary | ICD-10-CM | POA: Diagnosis not present

## 2024-09-22 DIAGNOSIS — G894 Chronic pain syndrome: Secondary | ICD-10-CM | POA: Diagnosis not present

## 2024-09-22 DIAGNOSIS — E66811 Obesity, class 1: Secondary | ICD-10-CM | POA: Diagnosis not present

## 2024-09-22 DIAGNOSIS — E6609 Other obesity due to excess calories: Secondary | ICD-10-CM | POA: Diagnosis not present

## 2024-09-22 DIAGNOSIS — R972 Elevated prostate specific antigen [PSA]: Secondary | ICD-10-CM | POA: Diagnosis not present

## 2024-09-23 DIAGNOSIS — J301 Allergic rhinitis due to pollen: Secondary | ICD-10-CM | POA: Diagnosis not present

## 2024-09-29 DIAGNOSIS — J301 Allergic rhinitis due to pollen: Secondary | ICD-10-CM | POA: Diagnosis not present

## 2024-10-01 DIAGNOSIS — Z981 Arthrodesis status: Secondary | ICD-10-CM | POA: Diagnosis not present

## 2024-10-01 DIAGNOSIS — Z6832 Body mass index (BMI) 32.0-32.9, adult: Secondary | ICD-10-CM | POA: Diagnosis not present

## 2024-10-06 DIAGNOSIS — J301 Allergic rhinitis due to pollen: Secondary | ICD-10-CM | POA: Diagnosis not present

## 2024-10-11 DIAGNOSIS — R972 Elevated prostate specific antigen [PSA]: Secondary | ICD-10-CM | POA: Diagnosis not present

## 2024-10-13 DIAGNOSIS — J301 Allergic rhinitis due to pollen: Secondary | ICD-10-CM | POA: Diagnosis not present

## 2024-10-15 DIAGNOSIS — R1013 Epigastric pain: Secondary | ICD-10-CM | POA: Diagnosis not present

## 2024-10-18 DIAGNOSIS — R972 Elevated prostate specific antigen [PSA]: Secondary | ICD-10-CM | POA: Diagnosis not present

## 2025-08-17 ENCOUNTER — Ambulatory Visit
